# Patient Record
Sex: Female | Born: 1935
Health system: Southern US, Community
[De-identification: ages and names within clinical notes are randomized; demographics above are authoritative.]

## PROBLEM LIST (undated history)

## (undated) DIAGNOSIS — J9611 Chronic respiratory failure with hypoxia: Secondary | ICD-10-CM

## (undated) DIAGNOSIS — K921 Melena: Secondary | ICD-10-CM

## (undated) DIAGNOSIS — Z972 Presence of dental prosthetic device (complete) (partial): Secondary | ICD-10-CM

## (undated) DIAGNOSIS — N3941 Urge incontinence: Secondary | ICD-10-CM

## (undated) DIAGNOSIS — D120 Benign neoplasm of cecum: Secondary | ICD-10-CM

## (undated) DIAGNOSIS — J45909 Unspecified asthma, uncomplicated: Secondary | ICD-10-CM

## (undated) DIAGNOSIS — C3492 Malignant neoplasm of unspecified part of left bronchus or lung: Secondary | ICD-10-CM

## (undated) DIAGNOSIS — I10 Essential (primary) hypertension: Secondary | ICD-10-CM

## (undated) DIAGNOSIS — N813 Complete uterovaginal prolapse: Secondary | ICD-10-CM

## (undated) DIAGNOSIS — J439 Emphysema, unspecified: Secondary | ICD-10-CM

## (undated) DIAGNOSIS — E785 Hyperlipidemia, unspecified: Secondary | ICD-10-CM

## (undated) DIAGNOSIS — K625 Hemorrhage of anus and rectum: Secondary | ICD-10-CM

## (undated) DIAGNOSIS — K529 Noninfective gastroenteritis and colitis, unspecified: Secondary | ICD-10-CM

## (undated) DIAGNOSIS — C3490 Malignant neoplasm of unspecified part of unspecified bronchus or lung: Secondary | ICD-10-CM

## (undated) DIAGNOSIS — N952 Postmenopausal atrophic vaginitis: Secondary | ICD-10-CM

## (undated) DIAGNOSIS — D123 Benign neoplasm of transverse colon: Secondary | ICD-10-CM

## (undated) DIAGNOSIS — IMO0002 Reserved for concepts with insufficient information to code with codable children: Secondary | ICD-10-CM

## (undated) HISTORY — DX: Noninfective gastroenteritis and colitis, unspecified: K52.9

## (undated) HISTORY — DX: Melena: K92.1

## (undated) HISTORY — DX: Reserved for concepts with insufficient information to code with codable children: IMO0002

## (undated) HISTORY — DX: Emphysema, unspecified: J43.9

## (undated) HISTORY — DX: Complete uterovaginal prolapse: N81.3

## (undated) HISTORY — DX: Chronic respiratory failure with hypoxia: J96.11

## (undated) HISTORY — DX: Hemorrhage of anus and rectum: K62.5

## (undated) HISTORY — DX: Benign neoplasm of cecum: D12.0

## (undated) HISTORY — DX: Postmenopausal atrophic vaginitis: N95.2

## (undated) HISTORY — DX: Essential (primary) hypertension: I10

## (undated) HISTORY — DX: Urge incontinence: N39.41

## (undated) HISTORY — DX: Malignant neoplasm of unspecified part of left bronchus or lung: C34.92

## (undated) HISTORY — DX: Benign neoplasm of transverse colon: D12.3

## (undated) HISTORY — DX: Hyperlipidemia, unspecified: E78.5

---

## 1968-07-28 HISTORY — PX: OOPHORECTOMY: SHX86

## 2011-07-13 ENCOUNTER — Ambulatory Visit: Payer: Self-pay

## 2011-07-18 ENCOUNTER — Ambulatory Visit: Payer: Self-pay

## 2011-08-06 DIAGNOSIS — J40 Bronchitis, not specified as acute or chronic: Secondary | ICD-10-CM | POA: Diagnosis not present

## 2011-08-06 DIAGNOSIS — N814 Uterovaginal prolapse, unspecified: Secondary | ICD-10-CM | POA: Diagnosis not present

## 2011-08-28 DIAGNOSIS — H2510 Age-related nuclear cataract, unspecified eye: Secondary | ICD-10-CM | POA: Diagnosis not present

## 2012-01-20 DIAGNOSIS — R030 Elevated blood-pressure reading, without diagnosis of hypertension: Secondary | ICD-10-CM | POA: Diagnosis not present

## 2012-01-20 DIAGNOSIS — R51 Headache: Secondary | ICD-10-CM | POA: Diagnosis not present

## 2012-01-20 DIAGNOSIS — J449 Chronic obstructive pulmonary disease, unspecified: Secondary | ICD-10-CM | POA: Diagnosis not present

## 2012-02-09 DIAGNOSIS — T148 Other injury of unspecified body region: Secondary | ICD-10-CM | POA: Diagnosis not present

## 2012-02-09 DIAGNOSIS — J449 Chronic obstructive pulmonary disease, unspecified: Secondary | ICD-10-CM | POA: Diagnosis not present

## 2012-03-02 DIAGNOSIS — J449 Chronic obstructive pulmonary disease, unspecified: Secondary | ICD-10-CM | POA: Diagnosis not present

## 2012-03-02 DIAGNOSIS — N814 Uterovaginal prolapse, unspecified: Secondary | ICD-10-CM | POA: Diagnosis not present

## 2012-04-06 DIAGNOSIS — N813 Complete uterovaginal prolapse: Secondary | ICD-10-CM | POA: Diagnosis not present

## 2012-04-06 DIAGNOSIS — N952 Postmenopausal atrophic vaginitis: Secondary | ICD-10-CM | POA: Diagnosis not present

## 2012-04-19 DIAGNOSIS — N952 Postmenopausal atrophic vaginitis: Secondary | ICD-10-CM | POA: Diagnosis not present

## 2012-04-19 DIAGNOSIS — N813 Complete uterovaginal prolapse: Secondary | ICD-10-CM | POA: Diagnosis not present

## 2012-04-26 DIAGNOSIS — N952 Postmenopausal atrophic vaginitis: Secondary | ICD-10-CM | POA: Diagnosis not present

## 2012-04-26 DIAGNOSIS — N813 Complete uterovaginal prolapse: Secondary | ICD-10-CM | POA: Diagnosis not present

## 2012-05-11 DIAGNOSIS — R030 Elevated blood-pressure reading, without diagnosis of hypertension: Secondary | ICD-10-CM | POA: Diagnosis not present

## 2012-05-11 DIAGNOSIS — N813 Complete uterovaginal prolapse: Secondary | ICD-10-CM | POA: Diagnosis not present

## 2012-05-11 DIAGNOSIS — N952 Postmenopausal atrophic vaginitis: Secondary | ICD-10-CM | POA: Diagnosis not present

## 2012-05-12 DIAGNOSIS — Z23 Encounter for immunization: Secondary | ICD-10-CM | POA: Diagnosis not present

## 2012-05-12 DIAGNOSIS — I10 Essential (primary) hypertension: Secondary | ICD-10-CM | POA: Diagnosis not present

## 2012-05-12 DIAGNOSIS — J449 Chronic obstructive pulmonary disease, unspecified: Secondary | ICD-10-CM | POA: Diagnosis not present

## 2012-06-08 DIAGNOSIS — J449 Chronic obstructive pulmonary disease, unspecified: Secondary | ICD-10-CM | POA: Diagnosis not present

## 2012-06-08 DIAGNOSIS — I10 Essential (primary) hypertension: Secondary | ICD-10-CM | POA: Diagnosis not present

## 2012-06-09 DIAGNOSIS — N952 Postmenopausal atrophic vaginitis: Secondary | ICD-10-CM | POA: Diagnosis not present

## 2012-06-09 DIAGNOSIS — R030 Elevated blood-pressure reading, without diagnosis of hypertension: Secondary | ICD-10-CM | POA: Diagnosis not present

## 2012-06-09 DIAGNOSIS — N813 Complete uterovaginal prolapse: Secondary | ICD-10-CM | POA: Diagnosis not present

## 2012-07-15 DIAGNOSIS — N814 Uterovaginal prolapse, unspecified: Secondary | ICD-10-CM | POA: Diagnosis not present

## 2012-07-15 DIAGNOSIS — N952 Postmenopausal atrophic vaginitis: Secondary | ICD-10-CM | POA: Diagnosis not present

## 2012-09-08 DIAGNOSIS — I10 Essential (primary) hypertension: Secondary | ICD-10-CM | POA: Diagnosis not present

## 2012-09-08 DIAGNOSIS — E785 Hyperlipidemia, unspecified: Secondary | ICD-10-CM | POA: Diagnosis not present

## 2012-09-08 DIAGNOSIS — Z0000 Encounter for general adult medical examination without abnormal findings: Secondary | ICD-10-CM | POA: Diagnosis not present

## 2012-09-08 DIAGNOSIS — J3489 Other specified disorders of nose and nasal sinuses: Secondary | ICD-10-CM | POA: Diagnosis not present

## 2012-09-13 DIAGNOSIS — Z0000 Encounter for general adult medical examination without abnormal findings: Secondary | ICD-10-CM | POA: Diagnosis not present

## 2012-09-16 DIAGNOSIS — E039 Hypothyroidism, unspecified: Secondary | ICD-10-CM | POA: Diagnosis not present

## 2012-09-16 DIAGNOSIS — R6889 Other general symptoms and signs: Secondary | ICD-10-CM | POA: Diagnosis not present

## 2012-09-20 DIAGNOSIS — N952 Postmenopausal atrophic vaginitis: Secondary | ICD-10-CM | POA: Diagnosis not present

## 2012-09-20 DIAGNOSIS — R030 Elevated blood-pressure reading, without diagnosis of hypertension: Secondary | ICD-10-CM | POA: Diagnosis not present

## 2012-09-20 DIAGNOSIS — N813 Complete uterovaginal prolapse: Secondary | ICD-10-CM | POA: Diagnosis not present

## 2012-12-13 DIAGNOSIS — N813 Complete uterovaginal prolapse: Secondary | ICD-10-CM | POA: Diagnosis not present

## 2012-12-13 DIAGNOSIS — R030 Elevated blood-pressure reading, without diagnosis of hypertension: Secondary | ICD-10-CM | POA: Diagnosis not present

## 2012-12-13 DIAGNOSIS — R6889 Other general symptoms and signs: Secondary | ICD-10-CM | POA: Diagnosis not present

## 2012-12-13 DIAGNOSIS — I10 Essential (primary) hypertension: Secondary | ICD-10-CM | POA: Diagnosis not present

## 2012-12-13 DIAGNOSIS — N8111 Cystocele, midline: Secondary | ICD-10-CM | POA: Diagnosis not present

## 2012-12-13 DIAGNOSIS — J3489 Other specified disorders of nose and nasal sinuses: Secondary | ICD-10-CM | POA: Diagnosis not present

## 2012-12-13 DIAGNOSIS — N952 Postmenopausal atrophic vaginitis: Secondary | ICD-10-CM | POA: Diagnosis not present

## 2013-01-13 DIAGNOSIS — R252 Cramp and spasm: Secondary | ICD-10-CM | POA: Diagnosis not present

## 2013-01-13 DIAGNOSIS — I10 Essential (primary) hypertension: Secondary | ICD-10-CM | POA: Diagnosis not present

## 2013-01-13 DIAGNOSIS — R51 Headache: Secondary | ICD-10-CM | POA: Diagnosis not present

## 2013-01-24 DIAGNOSIS — R946 Abnormal results of thyroid function studies: Secondary | ICD-10-CM | POA: Diagnosis not present

## 2013-01-24 LAB — TSH: TSH: 0 u[IU]/mL — AB (ref <=5.90)

## 2013-02-15 DIAGNOSIS — R51 Headache: Secondary | ICD-10-CM | POA: Diagnosis not present

## 2013-02-15 DIAGNOSIS — J449 Chronic obstructive pulmonary disease, unspecified: Secondary | ICD-10-CM | POA: Diagnosis not present

## 2013-03-21 DIAGNOSIS — N813 Complete uterovaginal prolapse: Secondary | ICD-10-CM | POA: Diagnosis not present

## 2013-03-21 DIAGNOSIS — N3941 Urge incontinence: Secondary | ICD-10-CM | POA: Diagnosis not present

## 2013-03-21 DIAGNOSIS — N952 Postmenopausal atrophic vaginitis: Secondary | ICD-10-CM | POA: Diagnosis not present

## 2013-04-13 DIAGNOSIS — E041 Nontoxic single thyroid nodule: Secondary | ICD-10-CM | POA: Diagnosis not present

## 2013-04-13 DIAGNOSIS — E0590 Thyrotoxicosis, unspecified without thyrotoxic crisis or storm: Secondary | ICD-10-CM | POA: Diagnosis not present

## 2013-04-28 DIAGNOSIS — Z23 Encounter for immunization: Secondary | ICD-10-CM | POA: Diagnosis not present

## 2013-05-16 DIAGNOSIS — E0590 Thyrotoxicosis, unspecified without thyrotoxic crisis or storm: Secondary | ICD-10-CM | POA: Diagnosis not present

## 2013-05-16 DIAGNOSIS — E041 Nontoxic single thyroid nodule: Secondary | ICD-10-CM | POA: Diagnosis not present

## 2013-05-23 DIAGNOSIS — E0520 Thyrotoxicosis with toxic multinodular goiter without thyrotoxic crisis or storm: Secondary | ICD-10-CM | POA: Diagnosis not present

## 2013-06-07 ENCOUNTER — Ambulatory Visit: Payer: Self-pay

## 2013-06-07 DIAGNOSIS — E0590 Thyrotoxicosis, unspecified without thyrotoxic crisis or storm: Secondary | ICD-10-CM | POA: Diagnosis not present

## 2013-06-08 DIAGNOSIS — E0590 Thyrotoxicosis, unspecified without thyrotoxic crisis or storm: Secondary | ICD-10-CM | POA: Diagnosis not present

## 2013-06-17 ENCOUNTER — Ambulatory Visit: Payer: Self-pay

## 2013-06-17 DIAGNOSIS — E0590 Thyrotoxicosis, unspecified without thyrotoxic crisis or storm: Secondary | ICD-10-CM | POA: Diagnosis not present

## 2013-07-25 DIAGNOSIS — E0590 Thyrotoxicosis, unspecified without thyrotoxic crisis or storm: Secondary | ICD-10-CM | POA: Diagnosis not present

## 2013-08-01 DIAGNOSIS — E0510 Thyrotoxicosis with toxic single thyroid nodule without thyrotoxic crisis or storm: Secondary | ICD-10-CM | POA: Diagnosis not present

## 2013-08-23 DIAGNOSIS — E0500 Thyrotoxicosis with diffuse goiter without thyrotoxic crisis or storm: Secondary | ICD-10-CM | POA: Diagnosis not present

## 2013-08-23 DIAGNOSIS — J449 Chronic obstructive pulmonary disease, unspecified: Secondary | ICD-10-CM | POA: Diagnosis not present

## 2013-08-23 DIAGNOSIS — J309 Allergic rhinitis, unspecified: Secondary | ICD-10-CM | POA: Diagnosis not present

## 2013-08-23 DIAGNOSIS — I10 Essential (primary) hypertension: Secondary | ICD-10-CM | POA: Diagnosis not present

## 2013-10-31 DIAGNOSIS — N8111 Cystocele, midline: Secondary | ICD-10-CM | POA: Diagnosis not present

## 2013-10-31 DIAGNOSIS — N952 Postmenopausal atrophic vaginitis: Secondary | ICD-10-CM | POA: Diagnosis not present

## 2013-10-31 DIAGNOSIS — E0590 Thyrotoxicosis, unspecified without thyrotoxic crisis or storm: Secondary | ICD-10-CM | POA: Diagnosis not present

## 2013-10-31 DIAGNOSIS — N3941 Urge incontinence: Secondary | ICD-10-CM | POA: Diagnosis not present

## 2013-10-31 DIAGNOSIS — N813 Complete uterovaginal prolapse: Secondary | ICD-10-CM | POA: Diagnosis not present

## 2013-11-01 DIAGNOSIS — E0590 Thyrotoxicosis, unspecified without thyrotoxic crisis or storm: Secondary | ICD-10-CM | POA: Diagnosis not present

## 2013-11-07 DIAGNOSIS — E0520 Thyrotoxicosis with toxic multinodular goiter without thyrotoxic crisis or storm: Secondary | ICD-10-CM | POA: Diagnosis not present

## 2013-11-16 DIAGNOSIS — Z23 Encounter for immunization: Secondary | ICD-10-CM | POA: Diagnosis not present

## 2013-11-16 DIAGNOSIS — Z01419 Encounter for gynecological examination (general) (routine) without abnormal findings: Secondary | ICD-10-CM | POA: Diagnosis not present

## 2013-11-16 DIAGNOSIS — J449 Chronic obstructive pulmonary disease, unspecified: Secondary | ICD-10-CM | POA: Diagnosis not present

## 2013-11-16 DIAGNOSIS — Z0000 Encounter for general adult medical examination without abnormal findings: Secondary | ICD-10-CM | POA: Diagnosis not present

## 2013-11-16 DIAGNOSIS — I10 Essential (primary) hypertension: Secondary | ICD-10-CM | POA: Diagnosis not present

## 2013-11-16 DIAGNOSIS — E785 Hyperlipidemia, unspecified: Secondary | ICD-10-CM | POA: Diagnosis not present

## 2013-11-16 LAB — LIPID PANEL
Cholesterol: 194 mg/dL (ref 0–200)
HDL: 75 mg/dL — AB (ref 35–70)
LDL Cholesterol: 99 mg/dL
Triglycerides: 101 mg/dL (ref 40–160)

## 2014-03-09 DIAGNOSIS — E0520 Thyrotoxicosis with toxic multinodular goiter without thyrotoxic crisis or storm: Secondary | ICD-10-CM | POA: Diagnosis not present

## 2014-03-14 DIAGNOSIS — N8111 Cystocele, midline: Secondary | ICD-10-CM | POA: Diagnosis not present

## 2014-03-14 DIAGNOSIS — N813 Complete uterovaginal prolapse: Secondary | ICD-10-CM | POA: Diagnosis not present

## 2014-03-14 DIAGNOSIS — N3941 Urge incontinence: Secondary | ICD-10-CM | POA: Diagnosis not present

## 2014-03-14 DIAGNOSIS — N952 Postmenopausal atrophic vaginitis: Secondary | ICD-10-CM | POA: Diagnosis not present

## 2014-03-16 DIAGNOSIS — E0520 Thyrotoxicosis with toxic multinodular goiter without thyrotoxic crisis or storm: Secondary | ICD-10-CM | POA: Diagnosis not present

## 2014-05-15 DIAGNOSIS — Z23 Encounter for immunization: Secondary | ICD-10-CM | POA: Diagnosis not present

## 2014-07-13 DIAGNOSIS — N813 Complete uterovaginal prolapse: Secondary | ICD-10-CM | POA: Diagnosis not present

## 2014-07-13 DIAGNOSIS — Z4689 Encounter for fitting and adjustment of other specified devices: Secondary | ICD-10-CM | POA: Diagnosis not present

## 2014-07-13 DIAGNOSIS — N952 Postmenopausal atrophic vaginitis: Secondary | ICD-10-CM | POA: Diagnosis not present

## 2014-07-13 DIAGNOSIS — N8110 Cystocele, unspecified: Secondary | ICD-10-CM | POA: Diagnosis not present

## 2014-07-13 DIAGNOSIS — N3941 Urge incontinence: Secondary | ICD-10-CM | POA: Diagnosis not present

## 2014-09-12 DIAGNOSIS — E0520 Thyrotoxicosis with toxic multinodular goiter without thyrotoxic crisis or storm: Secondary | ICD-10-CM | POA: Diagnosis not present

## 2014-09-19 DIAGNOSIS — E0520 Thyrotoxicosis with toxic multinodular goiter without thyrotoxic crisis or storm: Secondary | ICD-10-CM | POA: Diagnosis not present

## 2014-11-06 DIAGNOSIS — H1013 Acute atopic conjunctivitis, bilateral: Secondary | ICD-10-CM | POA: Diagnosis not present

## 2014-11-06 DIAGNOSIS — J309 Allergic rhinitis, unspecified: Secondary | ICD-10-CM | POA: Diagnosis not present

## 2014-11-06 DIAGNOSIS — I10 Essential (primary) hypertension: Secondary | ICD-10-CM | POA: Diagnosis not present

## 2014-11-14 DIAGNOSIS — Z4689 Encounter for fitting and adjustment of other specified devices: Secondary | ICD-10-CM | POA: Diagnosis not present

## 2014-11-14 DIAGNOSIS — N3941 Urge incontinence: Secondary | ICD-10-CM | POA: Diagnosis not present

## 2014-11-17 ENCOUNTER — Encounter: Payer: Self-pay | Admitting: Internal Medicine

## 2014-11-17 DIAGNOSIS — M7070 Other bursitis of hip, unspecified hip: Secondary | ICD-10-CM | POA: Insufficient documentation

## 2014-11-17 DIAGNOSIS — J309 Allergic rhinitis, unspecified: Secondary | ICD-10-CM | POA: Insufficient documentation

## 2014-11-17 DIAGNOSIS — N814 Uterovaginal prolapse, unspecified: Secondary | ICD-10-CM | POA: Insufficient documentation

## 2014-11-17 DIAGNOSIS — H1010 Acute atopic conjunctivitis, unspecified eye: Secondary | ICD-10-CM | POA: Insufficient documentation

## 2014-11-17 DIAGNOSIS — J439 Emphysema, unspecified: Secondary | ICD-10-CM | POA: Insufficient documentation

## 2014-11-17 DIAGNOSIS — I10 Essential (primary) hypertension: Secondary | ICD-10-CM | POA: Insufficient documentation

## 2014-11-17 DIAGNOSIS — E782 Mixed hyperlipidemia: Secondary | ICD-10-CM | POA: Insufficient documentation

## 2014-11-17 DIAGNOSIS — E0500 Thyrotoxicosis with diffuse goiter without thyrotoxic crisis or storm: Secondary | ICD-10-CM | POA: Insufficient documentation

## 2015-01-02 ENCOUNTER — Other Ambulatory Visit: Payer: Self-pay | Admitting: Internal Medicine

## 2015-01-18 ENCOUNTER — Ambulatory Visit (INDEPENDENT_AMBULATORY_CARE_PROVIDER_SITE_OTHER): Payer: Medicare Other | Admitting: Internal Medicine

## 2015-01-18 ENCOUNTER — Encounter: Payer: Self-pay | Admitting: Internal Medicine

## 2015-01-18 VITALS — BP 136/86 | HR 64 | Ht 64.75 in | Wt 148.0 lb

## 2015-01-18 DIAGNOSIS — J438 Other emphysema: Secondary | ICD-10-CM | POA: Diagnosis not present

## 2015-01-18 DIAGNOSIS — Z23 Encounter for immunization: Secondary | ICD-10-CM

## 2015-01-18 DIAGNOSIS — I10 Essential (primary) hypertension: Secondary | ICD-10-CM | POA: Diagnosis not present

## 2015-01-18 DIAGNOSIS — E782 Mixed hyperlipidemia: Secondary | ICD-10-CM

## 2015-01-18 LAB — POCT URINALYSIS DIPSTICK
BILIRUBIN UA: NEGATIVE
Blood, UA: NEGATIVE
Glucose, UA: NEGATIVE
KETONES UA: NEGATIVE
Leukocytes, UA: NEGATIVE
Nitrite, UA: NEGATIVE
Protein, UA: NEGATIVE
Spec Grav, UA: 1.01
Urobilinogen, UA: 0.2
pH, UA: 5

## 2015-01-18 NOTE — Progress Notes (Signed)
Patient: Paige Hart, Female    DOB: [birth year], 80 y.o.   MRN: [id number] Visit Date: 01/18/2015  Today's Provider: Halina Maidens, MD   Chief Complaint  Patient presents with  . Medicare Wellness   Subjective:    Annual wellness visit Paige Hart is a 80 y.o. female who presents today for her Subsequent Annual Wellness Visit. She feels well. She reports exercising - home exercises and walking daily. She reports she is sleeping well.   ----------------------------------------------------------- Hypertension This is a chronic problem. The current episode started more than 1 year ago. The problem is unchanged. The problem is controlled. Associated symptoms include shortness of breath. Pertinent negatives include no blurred vision, chest pain, headaches, orthopnea, palpitations, PND or sweats. There are no associated agents to hypertension. Risk factors for coronary artery disease include dyslipidemia. Past treatments include angiotensin blockers. The current treatment provides significant improvement. There are no compliance problems.  There is no history of kidney disease, CAD/MI or CVA.  Breathing Problem She complains of cough, difficulty breathing and shortness of breath. This is a chronic problem. The current episode started more than 1 year ago. The problem occurs intermittently. The problem has been waxing and waning. Pertinent negatives include no appetite change, chest pain, fever, headaches, myalgias, PND, sweats or trouble swallowing. Her symptoms are aggravated by any activity and change in weather. Her symptoms are alleviated by steroid inhaler (takes symbicort every AM and in the evening as needed). She reports significant improvement on treatment.  Hyperlipidemia This is a chronic problem. The current episode started more than 1 year ago. Recent lipid tests were reviewed and are high. Exacerbating diseases include hypothyroidism. There are no known factors aggravating her  hyperlipidemia. Associated symptoms include shortness of breath. Pertinent negatives include no chest pain or myalgias. Current antihyperlipidemic treatment includes diet change and exercise. The current treatment provides mild improvement of lipids. There are no compliance problems.     Review of Systems  Constitutional: Negative for fever, activity change, appetite change and unexpected weight change.  HENT: Negative for hearing loss, trouble swallowing and voice change.   Eyes: Negative for blurred vision and visual disturbance.  Respiratory: Positive for cough and shortness of breath. Negative for choking and chest tightness.   Cardiovascular: Negative for chest pain, palpitations, orthopnea, leg swelling and PND.  Gastrointestinal: Negative for abdominal pain, diarrhea and constipation.  Genitourinary: Positive for urgency (uses a pessary). Negative for dysuria and hematuria.  Musculoskeletal: Negative for myalgias and back pain.  Skin: Negative for color change and rash.  Neurological: Negative for tremors, numbness and headaches.  Psychiatric/Behavioral: Negative for sleep disturbance and dysphoric mood.    History   Social History  . Marital Status: Widowed    Spouse Name: N/A  . Number of Children: N/A  . Years of Education: N/A   Occupational History  . Not on file.   Social History Main Topics  . Smoking status: Former Research scientist (life sciences)  . Smokeless tobacco: Not on file  . Alcohol Use: No  . Drug Use: No  . Sexual Activity: Not on file   Other Topics Concern  . Not on file   Social History Narrative    Patient Active Problem List   Diagnosis Date Noted  . Allergic conjunctivitis 11/17/2014  . Allergic rhinitis 11/17/2014  . Essential (primary) hypertension 11/17/2014  . Graves disease 11/17/2014  . Bursitis of hip 11/17/2014  . Combined fat and carbohydrate induced hyperlipemia 11/17/2014  . Chronic obstructive pulmonary emphysema 11/17/2014  . Pelvic  relaxation due  to uterovaginal prolapse - pessary use 11/17/2014    Past Surgical History  Procedure Laterality Date  . Oophorectomy  1970    Her family history is not on file.    Previous Medications   BUDESONIDE-FORMOTEROL (SYMBICORT) 160-4.5 MCG/ACT INHALER    Inhale 1 puff into the lungs 2 (two) times daily.   CETIRIZINE HCL 10 MG CAPS    Take 1 tablet by mouth daily.   ESTRADIOL (ESTRACE) 0.1 MG/GM VAGINAL CREAM    Place vaginally.   LOSARTAN (COZAAR) 100 MG TABLET    TAKE ONE TABLET BY MOUTH ONCE DAILY   METHIMAZOLE (TAPAZOLE) 5 MG TABLET    Take 1 tablet by mouth daily.   MULTIPLE VITAMINS-MINERALS PO    Take 1 tablet by mouth daily.   OXYQUINOLONE SULFATE VAGINAL 0.025 % GEL    Place 1 application vaginally once a week.    Patient Care Team: Glean Hess, MD as PCP - General (Family Medicine) Judi Cong, MD as Physician Assistant (Endocrinology) Brayton Mars, MD as Consulting Physician (Obstetrics and Gynecology)     Objective:   Vitals: BP 136/86 mmHg  Pulse 64  Ht 5' 4.75" (1.645 m)  Wt 148 lb (67.132 kg)  BMI 24.81 kg/m2  Physical Exam  Constitutional: She is oriented to person, place, and time. She appears well-developed and well-nourished. No distress.  HENT:  Head: Normocephalic and atraumatic.  Mouth/Throat: No oropharyngeal exudate.  Eyes: Conjunctivae are normal. Pupils are equal, round, and reactive to light. Left eye exhibits no discharge.  Neck: Normal range of motion. Neck supple. No thyromegaly present.  Cardiovascular: Normal rate, regular rhythm and normal heart sounds.   Pulmonary/Chest: Effort normal. She has decreased breath sounds. She has no wheezes.  Abdominal: Soft. Normal appearance. There is no hepatosplenomegaly. There is no tenderness. There is no CVA tenderness.  Genitourinary: No breast swelling, tenderness, discharge or bleeding.  Lymphadenopathy:    She has no cervical adenopathy.    She has no axillary adenopathy.  Neurological:  She is alert and oriented to person, place, and time. She has normal reflexes.  Skin: Skin is warm, dry and intact.  Psychiatric: She has a normal mood and affect. Her speech is normal and behavior is normal. Cognition and memory are normal.    Activities of Daily Living In your present state of health, do you have any difficulty performing the following activities: 01/18/2015  Hearing? N  Vision? N  Difficulty concentrating or making decisions? N  Walking or climbing stairs? N  Dressing or bathing? N  Doing errands, shopping? N    Fall Risk Assessment Fall Risk  01/18/2015  Falls in the past year? No     Patient reports there are safety devices in place in shower at home.  Depression Screen PHQ 2/9 Scores 01/18/2015  PHQ - 2 Score 0    Cognitive Testing - 6-CIT   Correct? Score   What year is it? yes 0 Yes = 0    No = 4  What month is it? yes 0 Yes = 0    No = 3  Remember:     Pia [address]au, [place], [place]     What time is it? yes 0 Yes = 0    No = 3  Count backwards from 20 to 1 yes 0 Correct = 0    1 error = 2   More than 1 error = 4  Say the months  of the year in reverse. yes 0 Correct = 0    1 error = 2   More than 1 error = 4  What address did I ask you to remember? yes 2 Correct = 0  1 error = 2    2 error = 4    3 error = 6    4 error = 8    All wrong = 10       TOTAL SCORE  2/28   Interpretation:  Normal  Normal (0-7) Abnormal (8-28)        Assessment & Plan:     Annual Wellness Visit  Reviewed patient's Family Medical History Reviewed and updated list of patient's medical providers Assessment of cognitive impairment was done Assessed patient's functional ability Established a written schedule for health screening [place] Completed and Reviewed  Exercise Activities and Dietary recommendations Goals    . Reduce calorie intake to 2000 calories per day     She is starting a new diet/cleanse - she only consumes 500 cal two  days per week and eats normally on the other days.       Immunization History  Administered Date(s) Administered  . Pneumococcal Polysaccharide-23 11/16/2013    Health Maintenance  Topic Date Due  . TETANUS/TDAP  08/30/1955  . COLONOSCOPY  08/29/1986  . ZOSTAVAX  08/29/1996  . DEXA SCAN  08/29/2001  . PNA vac Low Risk Adult (2 of 2 - PCV13) 11/17/2014  . INFLUENZA VACCINE  02/26/2015     Discussed health benefits of physical activity, and encouraged her to engage in regular exercise appropriate for her age and condition.    ------------------------------------------------------------------------------------------------------------  1. Essential (primary) hypertension The current medical regimen is effective;  continue present plan and medications. - CBC with Differential/Platelet - Comprehensive metabolic panel - POCT urinalysis dipstick  2. Other emphysema Stable on Symbicort MDI  3. Combined fat and carbohydrate induced hyperlipemia Continue diet and exercise  - Lipid panel  4. Need for pneumococcal vaccination - Pneumococcal conjugate vaccine 13-valent IM    Halina Maidens, MD [facility]  01/18/2015

## 2015-01-18 NOTE — Patient Instructions (Addendum)
Pneumococcal Conjugate Vaccine: What You Need to Know Your doctor recommends that you, or your child, get a dose of PCV13 today. 1. Why get vaccinated? Pneumococcal conjugate vaccine (called PCV13 or Prevnar 13) is recommended to protect infants and toddlers, and some older children and adults with certain health conditions, from pneumococcal disease. Pneumococcal disease is caused by infection with Streptococcus pneumoniae bacteria. These bacteria can spread from person to person through close contact. Pneumococcal disease can lead to severe health problems, including pneumonia, blood infections, and meningitis. Meningitis is an infection of the covering of the brain. Pneumococcal meningitis is fairly rare (less than 1 case per 100,000 people each year), but it leads to other health problems, including deafness and brain damage. In children, it is fatal in about 1 case out of 10. Children younger than two are at higher risk for serious disease than older children. People with certain medical conditions, people over age 24, and cigarette smokers are also at higher risk. Before vaccine, pneumococcal infections caused many problems each year in the [place] in children younger than 5, including: 1. more than 700 cases of meningitis, 2. 13,000 blood infections, 3. about 5 million ear infections, and 4. about 200 deaths. About 4,000 adults still die each year because of pneumococcal infections. Pneumococcal infections can be hard to treat because some strains are resistant to antibiotics. This makes prevention through vaccination even more important. 2. PCV13 vaccine There are more than 90 types of pneumococcal bacteria. PCV13 protects against 13 of them. These 13 strains cause most severe infections in children and about half of infections in adults.  PCV13 is routinely given to children at 2, 4, 6, and 50-10 months of age. Children in this age range are at greatest risk for serious diseases  caused by pneumococcal infection. PCV13 vaccine may also be recommended for some older children or adults. Your doctor can give you details. A second type of pneumococcal vaccine, called PPSV23, may also be given to some children and adults, including anyone over age 12. There is a separate Vaccine Information Statement for this vaccine. 3. Precautions  Anyone who has ever had a life-threatening allergic reaction to a dose of this vaccine, to an earlier pneumococcal vaccine called PCV7 (or Prevnar), or to any vaccine containing diphtheria toxoid (for example, DTaP), should not get PCV13. Anyone with a severe allergy to any component of PCV13 should not get the vaccine. Tell your doctor if the person being vaccinated has any severe allergies. If the person scheduled for vaccination is sick, your doctor might decide to reschedule the shot on another day. Your doctor can give you more information about any of these precautions. 4. What are the risks of PCV13 vaccine?  With any medicine, including vaccines, there is a chance of side effects. These are usually mild and go away on their own, but serious reactions are also possible. Reported problems associated with PCV13 vary by dose and age, but generally:  About half of children became drowsy after the shot, had a temporary loss of appetite, or had redness or tenderness where the shot was given.  About 1 out of 3 had swelling where the shot was given.  About 1 out of 3 had a mild fever, and about 1 in 20 had a higher fever (over 102.34F).  Up to about 8 out of 10 became fussy or irritable. Adults receiving the vaccine have reported redness, pain, and swelling where the shot was given. Mild fever, fatigue, headache, chills, or  muscle pain have also been reported. Life-threatening allergic reactions from any vaccine are very rare. 5. What if there is a serious reaction? What should I look for?  Look for anything that concerns you, such as signs of  a severe allergic reaction, very high fever, or behavior changes. Signs of a severe allergic reaction can include hives, swelling of the face and throat, difficulty breathing, a fast heartbeat, dizziness, and weakness. These would start a few minutes to a few hours after the vaccination. What should I do?  If you think it is a severe allergic reaction or other emergency that can't wait, call 9-1-1 or get the person to the nearest hospital. Otherwise, call your doctor.  Afterward, the reaction should be reported to the Vaccine Adverse Event Reporting System (VAERS). Your doctor might file this report, or you can do it yourself through the VAERS web site at www.vaers.SamedayNews.es, or by calling 928-603-9961. VAERS is only for reporting reactions. They do not give medical advice. 6. The National Vaccine Injury Compensation Program The [employer] Vaccine Injury Compensation Program (VICP) is a federal program that was created to compensate people who may have been injured by certain vaccines. Persons who believe they may have been injured by a vaccine can learn about the program and about filing a claim by calling 250-153-7787 or visiting the [place] website at GoldCloset.com.ee. 7. How can I learn more?  Ask your doctor.  Call your local or state health department.  Contact the Centers for Disease Control and Prevention (CDC):  Call 603-862-9731 (1-800-CDC-INFO) or  Visit CDC's website at http://hunter.com/ CDC PCV13 Vaccine VIS (Interim) (09/24/11) Document Released: 05/11/2006 Document Revised: 11/28/2013 Document Reviewed: 09/02/2013 [facility] Patient Information 2015 [place], [place]. This information is not intended to replace advice given to you by your health care provider. Make sure you discuss any questions you have with your health care provider.   Health Maintenance  Topic Date Due  . TETANUS/TDAP  08/30/1955  . COLONOSCOPY  08/29/1986  . ZOSTAVAX  Shingles vaccine -  any time after age 81  . DEXA SCAN  08/29/2001  . PNA vac Low Risk Adult (2 of 2 - PCV13) Given today  . INFLUENZA VACCINE  02/26/2015    \ Breast Self-Awareness Practicing breast self-awareness may pick up problems early, prevent significant medical complications, and possibly save your life. By practicing breast self-awareness, you can become familiar with how your breasts look and feel and if your breasts are changing. This allows you to notice changes early. It can also offer you some reassurance that your breast health is good. One way to learn what is normal for your breasts and whether your breasts are changing is to do a breast self-exam. If you find a lump or something that was not present in the past, it is best to contact your caregiver right away. Other findings that should be evaluated by your caregiver include nipple discharge, especially if it is bloody; skin changes or reddening; areas where the skin seems to be pulled in (retracted); or new lumps and bumps. Breast pain is seldom associated with cancer (malignancy), but should also be evaluated by a caregiver. HOW TO PERFORM A BREAST SELF-EXAM The best time to examine your breasts is 5-7 days after your menstrual period is over. During menstruation, the breasts are lumpier, and it may be more difficult to pick up changes. If you do not menstruate, have reached menopause, or had your uterus removed (hysterectomy), you should examine your breasts at regular intervals,  such as monthly. If you are breastfeeding, examine your breasts after a feeding or after using a breast pump. Breast implants do not decrease the risk for lumps or tumors, so continue to perform breast self-exams as recommended. Talk to your caregiver about how to determine the difference between the implant and breast tissue. Also, talk about the amount of pressure you should use during the exam. Over time, you will become more familiar with the variations of your breasts and  more comfortable with the exam. A breast self-exam requires you to remove all your clothes above the waist. 5. Look at your breasts and nipples. Stand in front of a mirror in a room with good lighting. With your hands on your hips, push your hands firmly downward. Look for a difference in shape, contour, and size from one breast to the other (asymmetry). Asymmetry includes puckers, dips, or bumps. Also, look for skin changes, such as reddened or scaly areas on the breasts. Look for nipple changes, such as discharge, dimpling, repositioning, or redness. 6. Carefully feel your breasts. This is best done either in the shower or tub while using soapy water or when flat on your back. Place the arm (on the side of the breast you are examining) above your head. Use the pads (not the fingertips) of your three middle fingers on your opposite hand to feel your breasts. Start in the underarm area and use  inch (2 cm) overlapping circles to feel your breast. Use 3 different levels of pressure (light, medium, and firm pressure) at each circle before moving to the next circle. The light pressure is needed to feel the tissue closest to the skin. The medium pressure will help to feel breast tissue a little deeper, while the firm pressure is needed to feel the tissue close to the ribs. Continue the overlapping circles, moving downward over the breast until you feel your ribs below your breast. Then, move one finger-width towards the center of the body. Continue to use the  inch (2 cm) overlapping circles to feel your breast as you move slowly up toward the collar bone (clavicle) near the base of the neck. Continue the up and down exam using all 3 pressures until you reach the middle of the chest. Do this with each breast, carefully feeling for lumps or changes. 7.  Keep a written record with breast changes or normal findings for each breast. By writing this information down, you do not need to depend only on memory for size,  tenderness, or location. Write down where you are in your menstrual cycle, if you are still menstruating. Breast tissue can have some lumps or thick tissue. However, see your caregiver if you find anything that concerns you.  SEEK MEDICAL CARE IF:  You see a change in shape, contour, or size of your breasts or nipples.   You see skin changes, such as reddened or scaly areas on the breasts or nipples.   You have an unusual discharge from your nipples.   You feel a new lump or unusually thick areas.  Document Released: 07/14/2005 Document Revised: 06/30/2012 Document Reviewed: 10/29/2011 [facility] Patient Information 2015 [place], [place]. This information is not intended to replace advice given to you by your health care provider. Make sure you discuss any questions you have with your health care provider.

## 2015-01-19 LAB — CBC WITH DIFFERENTIAL/PLATELET
Basophils Absolute: 0.1 10*3/uL (ref 0.0–0.2)
Basos: 1 %
EOS (ABSOLUTE): 0.2 10*3/uL (ref 0.0–0.4)
EOS: 2 %
Hematocrit: 41.7 % (ref 34.0–46.6)
Hemoglobin: 13.5 g/dL (ref 11.1–15.9)
IMMATURE GRANULOCYTES: 0 %
Immature Grans (Abs): 0 10*3/uL (ref 0.0–0.1)
LYMPHS ABS: 1.6 10*3/uL (ref 0.7–3.1)
Lymphs: 22 %
MCH: 28.5 pg (ref 26.6–33.0)
MCHC: 32.4 g/dL (ref 31.5–35.7)
MCV: 88 fL (ref 79–97)
Monocytes Absolute: 0.6 10*3/uL (ref 0.1–0.9)
Monocytes: 8 %
NEUTROS ABS: 5.1 10*3/uL (ref 1.4–7.0)
Neutrophils: 67 %
Platelets: 235 10*3/uL (ref 150–379)
RBC: 4.74 x10E6/uL (ref 3.77–5.28)
RDW: 14.4 % (ref 12.3–15.4)
WBC: 7.6 10*3/uL (ref 3.4–10.8)

## 2015-01-19 LAB — COMPREHENSIVE METABOLIC PANEL
A/G RATIO: 1.9 (ref 1.1–2.5)
ALT: 14 IU/L (ref 0–32)
AST: 15 IU/L (ref 0–40)
Albumin: 4.3 g/dL (ref 3.5–4.8)
Alkaline Phosphatase: 66 IU/L (ref 39–117)
BUN/Creatinine Ratio: 18 (ref 11–26)
BUN: 15 mg/dL (ref 8–27)
Bilirubin Total: 0.3 mg/dL (ref 0.0–1.2)
CO2: 26 mmol/L (ref 18–29)
Calcium: 8.9 mg/dL (ref 8.7–10.3)
Chloride: 102 mmol/L (ref 97–108)
Creatinine, Ser: 0.85 mg/dL (ref 0.57–1.00)
GFR calc Af Amer: 76 mL/min/{1.73_m2} (ref >59)
GFR, EST NON AFRICAN AMERICAN: 66 mL/min/{1.73_m2} (ref >59)
GLOBULIN, TOTAL: 2.3 g/dL (ref 1.5–4.5)
GLUCOSE: 82 mg/dL (ref 65–99)
Potassium: 4.8 mmol/L (ref 3.5–5.2)
SODIUM: 143 mmol/L (ref 134–144)
Total Protein: 6.6 g/dL (ref 6.0–8.5)

## 2015-01-19 LAB — LIPID PANEL
CHOL/HDL RATIO: 2.5 ratio (ref 0.0–4.4)
Cholesterol, Total: 188 mg/dL (ref 100–199)
HDL: 74 mg/dL (ref >39)
LDL CALC: 92 mg/dL (ref 0–99)
TRIGLYCERIDES: 110 mg/dL (ref 0–149)
VLDL Cholesterol Cal: 22 mg/dL (ref 5–40)

## 2015-02-23 DIAGNOSIS — E0520 Thyrotoxicosis with toxic multinodular goiter without thyrotoxic crisis or storm: Secondary | ICD-10-CM | POA: Diagnosis not present

## 2015-03-02 DIAGNOSIS — E0520 Thyrotoxicosis with toxic multinodular goiter without thyrotoxic crisis or storm: Secondary | ICD-10-CM | POA: Diagnosis not present

## 2015-03-06 ENCOUNTER — Ambulatory Visit: Payer: Self-pay | Admitting: Obstetrics and Gynecology

## 2015-03-14 ENCOUNTER — Ambulatory Visit: Payer: Self-pay | Admitting: Obstetrics and Gynecology

## 2015-03-21 ENCOUNTER — Ambulatory Visit: Payer: Self-pay | Admitting: Obstetrics and Gynecology

## 2015-03-22 ENCOUNTER — Ambulatory Visit (INDEPENDENT_AMBULATORY_CARE_PROVIDER_SITE_OTHER): Payer: Medicare Other | Admitting: Obstetrics and Gynecology

## 2015-03-22 ENCOUNTER — Encounter: Payer: Self-pay | Admitting: Obstetrics and Gynecology

## 2015-03-22 VITALS — BP 178/79 | HR 66 | Ht 64.0 in | Wt 151.7 lb

## 2015-03-22 DIAGNOSIS — Z4689 Encounter for fitting and adjustment of other specified devices: Secondary | ICD-10-CM | POA: Insufficient documentation

## 2015-03-22 DIAGNOSIS — N814 Uterovaginal prolapse, unspecified: Secondary | ICD-10-CM | POA: Diagnosis not present

## 2015-03-22 NOTE — Progress Notes (Signed)
Patient ID: Paige Hart, female   DOB: July 10, 1935, 80 y.o.   MRN: [id number]  Chief complaint: 1.  Pessary Maintenance 2.  Cystocele. 3.  Uterine prolapse   54monthf/.u Pessary check gellhorn 2 1/2 Estrace cream - weekly trimosan- weekly Vb,vd, vo- neg  Past medical history, past surgical history, prolapse, medications, allergies reviewed and updated.  OBJECTIVE: BP 178/79 mmHg  Pulse 66  Ht 5' 4"  (1.626 m)  Wt 151 lb 11.2 oz (68.811 kg)  BMI 26.03 kg/m2 Well-appearing white female in no acute distress. Alert and oriented. Abdomen: Soft, nontender, without organomegaly. Pelvic exam: External genitalia normal BUS-normal Vagina-mild atrophic changes with a few areas of hyperemia without ulceration. Cervix-no lesions. Uterus-nontender Adnexa-nontender. Rectovaginal-external exam normal.  Procedure: Gellhorn pessary is removed, cleaned, and replaced.  IMPRESSION: 1.  Normal pessary check. 2.  Uterovaginal prolapse, stable. 3.  Mild vaginal hyperemia without ulceration.  PLAN: 1.  Continue with Estrace cream intravaginally weekly. 2.  Continue with TRIMO San gel intravaginal weekly. 3.  Return in 4 months for pessary check  MBrayton Mars MD

## 2015-03-22 NOTE — Patient Instructions (Signed)
1. Continue with Estrace cream intravaginally weekly.  2. Continue with TRIMO San gel intravaginal weekly.  3. Return in 4 months for pessary check

## 2015-04-23 ENCOUNTER — Encounter: Payer: Self-pay | Admitting: Internal Medicine

## 2015-04-23 ENCOUNTER — Ambulatory Visit (INDEPENDENT_AMBULATORY_CARE_PROVIDER_SITE_OTHER): Payer: Medicare Other | Admitting: Internal Medicine

## 2015-04-23 VITALS — BP 138/84 | HR 80 | Ht 64.75 in | Wt 151.8 lb

## 2015-04-23 DIAGNOSIS — T63441A Toxic effect of venom of bees, accidental (unintentional), initial encounter: Secondary | ICD-10-CM | POA: Diagnosis not present

## 2015-04-23 MED ORDER — PREDNISONE 10 MG (21) PO TBPK: 10.0000 mg | ORAL_TABLET | Freq: Every day | ORAL | Status: DC

## 2015-04-23 NOTE — Progress Notes (Signed)
Date:  04/23/2015   Name:  Paige Hart   DOB:  07-23-1935   MRN:  [id number]   Chief Complaint: Insect Bite  patient presents after a wasp sting above her left eye yesterday. She didn't take any medication initially used ice pack. This morning when she woke up her eye was more swollen and itching. Her vision is intact and she denies any pain to the eye and no tearing.  Review of Systems:  Review of Systems  Constitutional: Negative for fever and fatigue.  HENT: Positive for facial swelling.   Eyes: Negative for pain, redness and visual disturbance.  Respiratory: Negative for wheezing.   Cardiovascular: Negative for chest pain and palpitations.    Patient Active Problem List   Diagnosis Date Noted  . Pessary maintenance 03/22/2015  . Allergic conjunctivitis 11/17/2014  . Allergic rhinitis 11/17/2014  . Essential (primary) hypertension 11/17/2014  . Graves disease 11/17/2014  . Bursitis of hip 11/17/2014  . Combined fat and carbohydrate induced hyperlipemia 11/17/2014  . Chronic obstructive pulmonary emphysema 11/17/2014  . Pelvic relaxation due to uterovaginal prolapse - pessary use 11/17/2014    Prior to Admission medications   Medication Sig Start Date End Date Taking? Authorizing Provider  budesonide-formoterol (SYMBICORT) 160-4.5 MCG/ACT inhaler Inhale 1 puff into the lungs 2 (two) times daily. 09/28/14  Yes Historical Provider, MD  Cetirizine HCl 10 MG CAPS Take 1 tablet by mouth daily.   Yes Historical Provider, MD  estradiol (ESTRACE) 0.1 MG/GM vaginal cream Place vaginally.   Yes Historical Provider, MD  losartan (COZAAR) 100 MG tablet TAKE ONE TABLET BY MOUTH ONCE DAILY 01/02/15  Yes Glean Hess, MD  methimazole (TAPAZOLE) 5 MG tablet Take 1 tablet by mouth daily.   Yes Historical Provider, MD  MULTIPLE VITAMINS-MINERALS PO Take 1 tablet by mouth daily.   Yes Historical Provider, MD  OXYQUINOLONE SULFATE VAGINAL 0.025 % GEL Place 1 application vaginally once a week.    Yes Historical Provider, MD    No Known Allergies  Past Surgical History  Procedure Laterality Date  . Oophorectomy Left 1970    Social History  Substance Use Topics  . Smoking status: Former Research scientist (life sciences)  . Smokeless tobacco: None  . Alcohol Use: No     Medication list has been reviewed and updated.  Physical Examination:  Physical Exam  Constitutional: She appears well-developed and well-nourished.  Eyes: Conjunctivae and EOM are normal. Pupils are equal, round, and reactive to light.  Soft tissue swelling and edema of the upper and lower lid on the left outer portions.  Neck: Normal range of motion. Neck supple.  Lymphadenopathy:    She has no cervical adenopathy.    BP 138/84 mmHg  Pulse 80  Ht 5' 4.75" (1.645 m)  Wt 151 lb 12.8 oz (68.856 kg)  BMI 25.45 kg/m2  Assessment and Plan: 1. Bee sting reaction, accidental or unintentional, initial encounter Continue Benadryl as needed May use cool compresses if helpful Sleep with the head of the bed slightly elevated to reduce swelling - predniSONE (STERAPRED UNI-PAK 21 TAB) 10 MG (21) TBPK tablet; Take 1 tablet (10 mg total) by mouth daily.  Dispense: 21 tablet; Refill: 0   Halina Maidens, MD [facility]  04/23/2015

## 2015-05-08 ENCOUNTER — Ambulatory Visit (INDEPENDENT_AMBULATORY_CARE_PROVIDER_SITE_OTHER): Payer: Medicare Other

## 2015-05-08 DIAGNOSIS — Z23 Encounter for immunization: Secondary | ICD-10-CM | POA: Diagnosis not present

## 2015-05-22 ENCOUNTER — Ambulatory Visit (INDEPENDENT_AMBULATORY_CARE_PROVIDER_SITE_OTHER): Payer: Medicare Other | Admitting: Internal Medicine

## 2015-05-22 ENCOUNTER — Encounter: Payer: Self-pay | Admitting: Internal Medicine

## 2015-05-22 VITALS — BP 134/72 | HR 76 | Ht 64.75 in | Wt 152.2 lb

## 2015-05-22 DIAGNOSIS — H1013 Acute atopic conjunctivitis, bilateral: Secondary | ICD-10-CM | POA: Diagnosis not present

## 2015-05-22 DIAGNOSIS — H25013 Cortical age-related cataract, bilateral: Secondary | ICD-10-CM | POA: Diagnosis not present

## 2015-05-22 DIAGNOSIS — J438 Other emphysema: Secondary | ICD-10-CM

## 2015-05-22 DIAGNOSIS — I10 Essential (primary) hypertension: Secondary | ICD-10-CM | POA: Diagnosis not present

## 2015-05-22 NOTE — Progress Notes (Signed)
Date:  05/22/2015   Name:  Paige Hart   DOB:  1935-01-13   MRN:  [id number]   Chief Complaint: Asthma Asthma She complains of chest tightness. There is no cough, difficulty breathing, sputum production or wheezing. The problem has been gradually improving. Associated symptoms include postnasal drip, rhinorrhea and sneezing. Pertinent negatives include no chest pain or trouble swallowing. Her symptoms are aggravated by exercise and emotional stress. Her symptoms are alleviated by beta-agonist and steroid inhaler. Her past medical history is significant for asthma.   Allergies - Patient has known environmental allergies for which she takes Zyrtec intermittently. Is complaining of watery eyes and runny nose on a regular basis. Is concerned that this may be a side effect to Symbicort. His been using Symbicort daily for her asthma/COPD and is much improved. Her exercise tolerance is up and she is able to walk almost a mile per day. She has very little wheezing or shortness of breath just occasional chest tightness if she overexerts.   Review of Systems  Constitutional: Negative for chills, diaphoresis and fatigue.  HENT: Positive for postnasal drip, rhinorrhea and sneezing. Negative for congestion, hearing loss, mouth sores, sinus pressure and trouble swallowing.   Eyes: Positive for discharge (clear tears).  Respiratory: Negative for cough, sputum production, choking, wheezing and stridor.   Cardiovascular: Negative for chest pain, palpitations and leg swelling.  Gastrointestinal: Negative for abdominal pain.  Musculoskeletal: Negative for neck pain and neck stiffness.  Skin: Negative for rash.  Allergic/Immunologic: Positive for environmental allergies.  Hematological: Negative for adenopathy.    Patient Active Problem List   Diagnosis Date Noted  . Pessary maintenance 03/22/2015  . Allergic conjunctivitis 11/17/2014  . Allergic rhinitis 11/17/2014  . Essential (primary) hypertension  11/17/2014  . Graves disease 11/17/2014  . Bursitis of hip 11/17/2014  . Combined fat and carbohydrate induced hyperlipemia 11/17/2014  . Chronic obstructive pulmonary emphysema ([place]) 11/17/2014  . Pelvic relaxation due to uterovaginal prolapse - pessary use 11/17/2014    Prior to Admission medications   Medication Sig Start Date End Date Taking? Authorizing Provider  budesonide-formoterol (SYMBICORT) 160-4.5 MCG/ACT inhaler Inhale 1 puff into the lungs 2 (two) times daily. 09/28/14  Yes Historical Provider, MD  Cetirizine HCl 10 MG CAPS Take 1 tablet by mouth daily.   Yes Historical Provider, MD  estradiol (ESTRACE) 0.1 MG/GM vaginal cream Place vaginally.   Yes Historical Provider, MD  losartan (COZAAR) 100 MG tablet TAKE ONE TABLET BY MOUTH ONCE DAILY 01/02/15  Yes Glean Hess, MD  methimazole (TAPAZOLE) 5 MG tablet Take 1 tablet by mouth daily.   Yes Historical Provider, MD  MULTIPLE VITAMINS-MINERALS PO Take 1 tablet by mouth daily.   Yes Historical Provider, MD  OXYQUINOLONE SULFATE VAGINAL 0.025 % GEL Place 1 application vaginally once a week.   Yes Historical Provider, MD  predniSONE (STERAPRED UNI-PAK 21 TAB) 10 MG (21) TBPK tablet Take 1 tablet (10 mg total) by mouth daily. 04/23/15  Yes Glean Hess, MD    No Known Allergies  Past Surgical History  Procedure Laterality Date  . Oophorectomy Left 1970    Social History  Substance Use Topics  . Smoking status: Former Research scientist (life sciences)  . Smokeless tobacco: None  . Alcohol Use: No     Medication list has been reviewed and updated.   Physical Exam  Constitutional: She appears well-developed and well-nourished.  HENT:  Right Ear: Ear canal normal. Tympanic membrane is retracted.  Left Ear: Ear canal normal. Tympanic membrane  is retracted.  Mouth/Throat: Uvula is midline and oropharynx is clear and moist.  Eyes: Conjunctivae and EOM are normal. Pupils are equal, round, and reactive to light. Right eye exhibits no chemosis and  no exudate. Left eye exhibits no chemosis and no exudate. No scleral icterus.  Excessive clear tears bilaterally  Neck: Normal range of motion. Neck supple.  Cardiovascular: Normal rate, regular rhythm and normal heart sounds.   Pulmonary/Chest: Effort normal. She has decreased breath sounds in the right lower field and the left lower field. She has no wheezes. She has no rhonchi.  Lymphadenopathy:    She has no cervical adenopathy.  Psychiatric: She has a normal mood and affect.    BP 164/84 mmHg  Pulse 76  Ht 5' 4.75" (1.645 m)  Wt 152 lb 3.2 oz (69.037 kg)  BMI 25.51 kg/m2  Assessment and Plan: 1. Allergic conjunctivitis, bilateral Continue Zyrtec daily Ophthalmology exam to rule out other pathology  2. Other emphysema ([place]) Doing well on Symbicort without evidence that this is causing her allergy symptoms - I recommend continuing therapy  3. Essential (primary) hypertension Controlled   Halina Maidens, MD [facility]  05/22/2015

## 2015-07-03 ENCOUNTER — Other Ambulatory Visit: Payer: Self-pay | Admitting: Internal Medicine

## 2015-07-17 ENCOUNTER — Ambulatory Visit (INDEPENDENT_AMBULATORY_CARE_PROVIDER_SITE_OTHER): Payer: Medicare Other | Admitting: Obstetrics and Gynecology

## 2015-07-17 ENCOUNTER — Encounter: Payer: Self-pay | Admitting: Obstetrics and Gynecology

## 2015-07-17 VITALS — BP 165/78 | HR 75 | Ht 64.0 in | Wt 154.1 lb

## 2015-07-17 DIAGNOSIS — N814 Uterovaginal prolapse, unspecified: Secondary | ICD-10-CM | POA: Diagnosis not present

## 2015-07-17 DIAGNOSIS — S30814A Abrasion of vagina and vulva, initial encounter: Secondary | ICD-10-CM | POA: Diagnosis not present

## 2015-07-17 DIAGNOSIS — Z4689 Encounter for fitting and adjustment of other specified devices: Secondary | ICD-10-CM

## 2015-07-24 DIAGNOSIS — S30814A Abrasion of vagina and vulva, initial encounter: Secondary | ICD-10-CM | POA: Insufficient documentation

## 2015-07-24 NOTE — Progress Notes (Signed)
  Patient ID: Paige Hart, female   DOB: 1935-06-25, 80 y.o.   MRN: [id number]   Chief complaint: 1.  Pessary maintenance 2.  Cystocele. 3.  Uterine prolapse  Expand All Collapse All   Patient ID: Paige Hart, female DOB: [birth year], 80 y.o. MRN: [id number]  Chief complaint: 1. Pessary Maintenance 2. Cystocele. 3. Uterine prolapse   67monthf/.u Pessary check gellhorn 2 1/2 Estrace cream - weekly trimosan- weekly Vb,vd, vo- neg  Past medical history, past surgical history, prolapse, medications, allergies reviewed and updated.  OBJECTIVE: BP 165/78 mmHg  Pulse 75  Ht 5' 4"  (1.626 m)  Wt 154 lb 1.6 oz (69.899 kg)  BMI 26.44 kg/m2  Well-appearing white female in no acute distress. Alert and oriented. Abdomen: Soft, nontender, without organomegaly. Pelvic exam: External genitalia normal BUS-normal Vagina-mild atrophic changes with A 2 x 3 cm posterior vaginal Abrasion with slight pink discharge Cervix-no lesions. Uterus-nontender Adnexa-nontender. Rectovaginal-external exam normal.  Procedure: Gellhorn pessary is removed, cleaned, and replaced.  IMPRESSION: 1. Pessary check With evidence of posterior vaginal Abrasion 2. Uterovaginal prolapse, stable.  PLAN: 1. Continue with Estrace cream intravaginally weekly. 2. Continue with TRIMO San gel intravaginal weekly. 3. Return in 4 Weeks for pessary Removal 7-10 days  MBrayton Mars MD  A total of 15 minutes were spent face-to-face with the patient during this encounter and over half of that time dealt with counseling and coordination of care.  Note: This dictation was prepared with Dragon dictation along with smaller phrase technology. Any transcriptional errors that result from this process are unintentional.

## 2015-07-24 NOTE — Patient Instructions (Signed)
1.  Return in the third week of January 2017 for pessary removal for 7-10 days in order to allow the vaginal abrasion to heal. 2.  Continue using the Estrace cream and TRIMO san gel as directed.

## 2015-07-25 ENCOUNTER — Ambulatory Visit: Payer: Self-pay | Admitting: Obstetrics and Gynecology

## 2015-08-07 ENCOUNTER — Ambulatory Visit (INDEPENDENT_AMBULATORY_CARE_PROVIDER_SITE_OTHER): Payer: Medicare Other | Admitting: Obstetrics and Gynecology

## 2015-08-07 ENCOUNTER — Other Ambulatory Visit: Payer: Self-pay | Admitting: Obstetrics and Gynecology

## 2015-08-07 ENCOUNTER — Encounter: Payer: Self-pay | Admitting: Obstetrics and Gynecology

## 2015-08-07 VITALS — BP 161/67 | HR 80 | Ht 64.0 in | Wt 156.3 lb

## 2015-08-07 DIAGNOSIS — S30814A Abrasion of vagina and vulva, initial encounter: Secondary | ICD-10-CM | POA: Diagnosis not present

## 2015-08-07 DIAGNOSIS — N814 Uterovaginal prolapse, unspecified: Secondary | ICD-10-CM

## 2015-08-07 DIAGNOSIS — Z4689 Encounter for fitting and adjustment of other specified devices: Secondary | ICD-10-CM | POA: Diagnosis not present

## 2015-08-07 NOTE — Patient Instructions (Signed)
1.  Apply Estrace cream 1/2 g intravaginally nightly 2.  Return in 10 days for pessary insertion

## 2015-08-07 NOTE — Progress Notes (Signed)
Chief complaint: 1.  Vaginal abrasion.  Patient presents today for pessary removal and pelvic rest, to allow vaginal abrasion healed.  She has been using the gelhorn pessary for management of symptomatic pelvic relaxation, but at last visit was noted to have a vaginal abrasion, just prior to the holidays.  She now presents for removal of the pessary in order to allow the abrasion to heal.  Past medical history: Past surgical history, problem list, medications, and allergies are reviewed.  OBJECTIVE: BP 161/67 mmHg  Pulse 80  Ht 5' 4"  (1.626 m)  Wt 156 lb 4.8 oz (70.897 kg)  BMI 26.82 kg/m2   Well-appearing white female in no acute distress. Alert and oriented. Abdomen: Soft, nontender, without organomegaly. Pelvic exam: External genitalia normal BUS-normal Vagina-mild atrophic changes with A 2 x 3 cm posterior vaginal Abrasion with Yellow/white discharge; There is new malodor present Cervix-no lesions. Uterus-nontender Adnexa-nontender.  ASSESSMENT: 1.  Vaginal abrasion. 2.  Symptomatic pelvic relaxation(Uterovaginal prolapse), stable.  PLAN: 1.  Pessary is removed for 7-10 days. 2.  Apply Estrace cream 1/2 g intravaginally every night for 10 days. 3.  Return in 10 days for follow-up and probable pessary insertion.  A total of 15 minutes were spent face-to-face with the patient during this encounter and over half of that time dealt with counseling and coordination of care.  Brayton Mars, MD  Note: This dictation was prepared with Dragon dictation along with smaller phrase technology. Any transcriptional errors that result from this process are unintentional.

## 2015-08-16 ENCOUNTER — Encounter: Payer: Self-pay | Admitting: Obstetrics and Gynecology

## 2015-08-16 ENCOUNTER — Ambulatory Visit (INDEPENDENT_AMBULATORY_CARE_PROVIDER_SITE_OTHER): Payer: Medicare Other | Admitting: Obstetrics and Gynecology

## 2015-08-16 VITALS — BP 151/74 | HR 80 | Ht 64.0 in | Wt 154.4 lb

## 2015-08-16 DIAGNOSIS — N814 Uterovaginal prolapse, unspecified: Secondary | ICD-10-CM

## 2015-08-16 DIAGNOSIS — S30814D Abrasion of vagina and vulva, subsequent encounter: Secondary | ICD-10-CM

## 2015-08-16 NOTE — Progress Notes (Signed)
Chief complaint: 1.  Vaginal abrasion. 2.  Pelvic organ prolapse.  Patient presents today for follow-up on vaginal abrasion and possible pessary insertion.  She is using Premarin cream intravaginally every night for the past 2 weeks.  Past history, past surgical history, problem list, medications, and allergies are reviewed.  Review of systems: No vaginal discharge. No vaginal bleeding. No vaginal/pelvic pain. No UTI symptoms. No significant pelvic pressure or prolapse experienced since pessary removal.  OBJECTIVE: BP 151/74 mmHg  Pulse 80  Ht 5' 4"  (1.626 m)  Wt 154 lb 6.4 oz (70.035 kg)  BMI 26.49 kg/m2 Pleasant appearing white female in no acute distress. Abdomen: Soft, nontender. Pelvic exam: External genitalia-normal BUS-normal Vagina-fair estrogen effect; dime-sized abrasion/ulceration is noted posteriorly on the vaginal mucosa; no significant discharge.  ASSESSMENT: 1.  Vaginal abrasion, resolving but not completely healed 2.  Pelvic organ prolapse, minimally symptomatic this week.  PLAN: 1.  Continue with Premarin cream, intravaginal nightly 2.  Return in 1 week for recheck and probable gel horn pessary reinsertion.  Alanda Slim Nijel Flink, MD  A total of 15 minutes were spent face-to-face with the patient during this encounter and over half of that time dealt with counseling and coordination of care.  Note: This dictation was prepared with Dragon dictation along with smaller phrase technology. Any transcriptional errors that result from this process are unintentional.

## 2015-08-22 ENCOUNTER — Encounter: Payer: Self-pay | Admitting: Obstetrics and Gynecology

## 2015-08-22 ENCOUNTER — Ambulatory Visit (INDEPENDENT_AMBULATORY_CARE_PROVIDER_SITE_OTHER): Payer: Medicare Other | Admitting: Obstetrics and Gynecology

## 2015-08-22 VITALS — BP 143/85 | HR 58 | Wt 154.1 lb

## 2015-08-22 DIAGNOSIS — S30814D Abrasion of vagina and vulva, subsequent encounter: Secondary | ICD-10-CM

## 2015-08-22 DIAGNOSIS — N814 Uterovaginal prolapse, unspecified: Secondary | ICD-10-CM | POA: Diagnosis not present

## 2015-08-22 DIAGNOSIS — R350 Frequency of micturition: Secondary | ICD-10-CM

## 2015-08-22 LAB — POCT URINALYSIS DIPSTICK
BILIRUBIN UA: NEGATIVE
Blood, UA: NEGATIVE
Glucose, UA: NEGATIVE
Ketones, UA: NEGATIVE
LEUKOCYTES UA: NEGATIVE
Nitrite, UA: NEGATIVE
PH UA: 6
Protein, UA: NEGATIVE
SPEC GRAV UA: 1.025
Urobilinogen, UA: NEGATIVE

## 2015-08-22 NOTE — Patient Instructions (Signed)
1.  The Gellhorn pessary is inserted today. 2.  Continue to use Premarin cream once a week. 3.  Continue to use TRIMO San gel once a week. 4.   Return in 2 months for follow-up. 5.  Urinalysis with urine culture is obtained today

## 2015-08-22 NOTE — Progress Notes (Signed)
Chief complaint: 1.  Vaginal abrasion. 2.  Pelvic organ prolapse 3.  Urinary frequency  Patient presents today for follow-up on vaginal abrasion and possible pessary insertion. She is using Premarin cream intravaginally every night for the past 3 weeks.  Past history, past surgical history, problem list, medications, and allergies are reviewed.  Review of systems: No vaginal discharge. No vaginal bleeding. No vaginal/pelvic pain. Urinary frequency present No significant pelvic pressure or prolapse experienced since pessary removal.  OBJECTIVE:  Pleasant appearing white female in no acute distress. Abdomen: Soft, nontender. Pelvic exam: External genitalia-normal BUS-normal Vagina-fair estrogen effect; 0.25 inch-sized Granulation present (overall resolution of abrasion); no significant discharge.  ASSESSMENT: 1. Vaginal abrasion, 90% resolved 2. Pelvic organ prolapse, minimally symptomatic this week. 3.  Urinary frequency  PLAN: 1. Continue with Premarin cream Weekly 2.  Continue with TRIMO san gel weekly 3. Return 2 months for pessary maintenance 4.  Urinalysis with urine culture is obtained  Brayton Mars, MD  A total of 15 minutes were spent face-to-face with the patient during this encounter and over half of that time dealt with counseling and coordination of care.  Note: This dictation was prepared with Dragon dictation along with smaller phrase technology. Any transcriptional errors that result from this process are unintentional.

## 2015-08-23 LAB — URINE CULTURE: ORGANISM ID, BACTERIA: NO GROWTH

## 2015-08-28 DIAGNOSIS — E0520 Thyrotoxicosis with toxic multinodular goiter without thyrotoxic crisis or storm: Secondary | ICD-10-CM | POA: Diagnosis not present

## 2015-09-04 DIAGNOSIS — E0520 Thyrotoxicosis with toxic multinodular goiter without thyrotoxic crisis or storm: Secondary | ICD-10-CM | POA: Diagnosis not present

## 2015-10-18 ENCOUNTER — Encounter: Payer: Self-pay | Admitting: Obstetrics and Gynecology

## 2015-10-18 ENCOUNTER — Ambulatory Visit (INDEPENDENT_AMBULATORY_CARE_PROVIDER_SITE_OTHER): Payer: Medicare Other | Admitting: Obstetrics and Gynecology

## 2015-10-18 VITALS — BP 159/72 | HR 72 | Ht 64.0 in | Wt 153.0 lb

## 2015-10-18 DIAGNOSIS — N814 Uterovaginal prolapse, unspecified: Secondary | ICD-10-CM | POA: Diagnosis not present

## 2015-10-18 DIAGNOSIS — Z4689 Encounter for fitting and adjustment of other specified devices: Secondary | ICD-10-CM

## 2015-10-18 DIAGNOSIS — S30814D Abrasion of vagina and vulva, subsequent encounter: Secondary | ICD-10-CM

## 2015-10-18 NOTE — Progress Notes (Signed)
GYN ENCOUNTER NOTE  Subjective:       Paige Hart is a 81 y.o. No obstetric history on file. female is here for gynecologic evaluation of the following issues:  1. Pessary follow-up   Patient presents for scheduled 62-monthpessary check.  Pessary was removed for a few weeks in January for pelvic rest due to vaginal abrasion, then reinserted on 1/25 - since then patient is happy with the Gel Horn; denies itching, irritation, discharge, discomfort.  She uses both Premarin and Trimo-san get once weekly.  Gynecologic History No LMP recorded. Patient is postmenopausal. Contraception: post menopausal status Last Pap: unknown. Results were: N/A Last mammogram: unknown. Results were: N/A  Obstetric History OB History  No data available    Past Medical History  Diagnosis Date  . [place]   . Urge incontinence   . Procidentia of uterus   . Hypertension   . Vaginal atrophy   . Cystocele   . Thyroid disease     Past Surgical History  Procedure Laterality Date  . Oophorectomy Left 1970    Current Outpatient Prescriptions on File Prior to Visit  Medication Sig Dispense Refill  . budesonide-formoterol (SYMBICORT) 160-4.5 MCG/ACT inhaler Inhale 1 puff into the lungs 2 (two) times daily.    . Cetirizine HCl 10 MG CAPS Take 1 tablet by mouth daily.    .Marland KitchenESTRACE VAGINAL 0.1 MG/GM vaginal cream INSERT 0.5GRAMS VAGINALLY ONCE WEEKLY 45 g 1  . losartan (COZAAR) 100 MG tablet TAKE ONE TABLET BY MOUTH ONCE DAILY 30 tablet 5  . methimazole (TAPAZOLE) 5 MG tablet Take 1 tablet by mouth daily.    . MULTIPLE VITAMINS-MINERALS PO Take 1 tablet by mouth daily.    .Levin ErpSULFATE VAGINAL 0.025 % GEL Place 1 application vaginally once a week.     No current facility-administered medications on file prior to visit.    No Known Allergies  Social History   Social History  . Marital Status: Widowed    Spouse Name: N/A  . Number of Children: N/A  . Years of Education: N/A    Occupational History  . Not on file.   Social History Main Topics  . Smoking status: Former SResearch scientist (life sciences) . Smokeless tobacco: Not on file  . Alcohol Use: No  . Drug Use: No  . Sexual Activity: Not Currently   Other Topics Concern  . Not on file   Social History Narrative    Family History  Problem Relation Age of Onset  . Mental illness Sister   . Ovarian cancer Mother   . Diabetes Neg Hx   . Heart disease Neg Hx   . Breast cancer Neg Hx   . Colon cancer Neg Hx     The following portions of the patient's history were reviewed and updated as appropriate: allergies, current medications, past family history, past medical history, past social history, past surgical history and problem list.  Review of Systems Review of Systems - General ROS: negative for - chills, fatigue, fever, hot flashes, malaise or night sweats Hematological and Lymphatic ROS: negative for - bleeding problems or swollen lymph nodes Gastrointestinal ROS: negative for - abdominal pain, blood in stools, change in bowel habits and nausea/vomiting Musculoskeletal ROS: negative for - joint pain, muscle pain or muscular weakness Genito-Urinary ROS: negative for - change in menstrual cycle, dysmenorrhea, dyspareunia, dysuria, genital discharge, genital ulcers, hematuria, incontinence, irregular/heavy menses, nocturia or pelvic pain. Positive for mild urinary frequency  Objective:   BP 159/72  mmHg  Pulse 72  Ht 5' 4"  (1.626 m)  Wt 153 lb (69.4 kg)  BMI 26.25 kg/m2 CONSTITUTIONAL: Well-developed, well-nourished female in no acute distress.  PELVIC:  External Genitalia: Normal  BUS: Normal  Vagina: Fair, estrogen effect. Posterior hyperemia approx 2cm, no ulceration  Cervix: Normal  Uterus: Normal size, shape, consistency, mobile  RV: Normal    Assessment:   1. Pessary check, Stable 2. Pelvic organ prolapse 3. Urinary frequency   Plan:   1. Continue with Premarin cream Weekly 2. Continue with  TRIMO san gel weekly 3. Return 10 weeks for pessary maintenance   Olene Floss, PA-S Brayton Mars, MD   I have seen, interviewed, and examined the patient in conjunction with the [facility].A. student and affirm the diagnosis and management plan. Haizel Gatchell A. Minnie Legros, MD, FACOG   Note: This dictation was prepared with Dragon dictation along with smaller phrase technology. Any transcriptional errors that result from this process are unintentional.

## 2015-10-18 NOTE — Patient Instructions (Signed)
1. Return in 10 weeks for pessary maintenance 2. Continue using from Trimosan gel weekly and Premarin cream weekly as directed

## 2015-12-21 ENCOUNTER — Other Ambulatory Visit: Payer: Self-pay | Admitting: Internal Medicine

## 2015-12-27 ENCOUNTER — Ambulatory Visit (INDEPENDENT_AMBULATORY_CARE_PROVIDER_SITE_OTHER): Payer: Medicare Other | Admitting: Obstetrics and Gynecology

## 2015-12-27 ENCOUNTER — Encounter: Payer: Self-pay | Admitting: Obstetrics and Gynecology

## 2015-12-27 VITALS — BP 183/73 | HR 75 | Ht 64.0 in | Wt 152.0 lb

## 2015-12-27 DIAGNOSIS — N813 Complete uterovaginal prolapse: Secondary | ICD-10-CM | POA: Diagnosis not present

## 2015-12-27 DIAGNOSIS — N8111 Cystocele, midline: Secondary | ICD-10-CM | POA: Insufficient documentation

## 2015-12-27 DIAGNOSIS — N8110 Cystocele, unspecified: Secondary | ICD-10-CM | POA: Diagnosis not present

## 2015-12-27 DIAGNOSIS — Z4689 Encounter for fitting and adjustment of other specified devices: Secondary | ICD-10-CM | POA: Diagnosis not present

## 2015-12-27 DIAGNOSIS — IMO0002 Reserved for concepts with insufficient information to code with codable children: Secondary | ICD-10-CM

## 2015-12-27 NOTE — Progress Notes (Signed)
Chief complaint: 1. Cystocele 2. Vaginal atrophy 3. Procidentia 4. Pessary maintenance  10 week pessary check History of vaginal abrasion in January 2017 Gellhorn pessary Premarin intravaginally once a week Trimosan gel intravaginally once a week  No vaginal discharge, vaginal bleeding, vaginal odor, or vaginal pain  OBJECTIVE: BP 183/73 mmHg  Pulse 75  Ht 5' 4"  (1.626 m)  Wt 152 lb (68.947 kg)  BMI 26.08 kg/m2 CONSTITUTIONAL: Well-developed, well-nourished female in no acute distress.  PELVIC: External Genitalia: Normal BUS: Normal Vagina: Fair, estrogen effect. Posterior hyperemia approx 2cm, no ulceration. Minimal odor Cervix: Normal Uterus: Normal size, shape, consistency, mobile RV: Normal   PROCEDURE: Pessary is removed, cleaned, and reinserted  ASSESSMENT: 1. Pessary maintenance-normal 2. Uterine procidentia 3. Cystocele 4. Vaginal atrophy  PLAN: 1. Continue with Premarin cream Weekly 2. Continue with TRIMO san gel weekly 3. Return 12 weeks for pessary maintenance  A total of 15 minutes were spent face-to-face with the patient during this encounter and over half of that time dealt with counseling and coordination of care.   Brayton Mars, MD  Note: This dictation was prepared with Dragon dictation along with smaller phrase technology. Any transcriptional errors that result from this process are unintentional.

## 2015-12-27 NOTE — Patient Instructions (Signed)
1. Return in 12 weeks for pessary maintenance 2. Continue using Premarin cream once a week in the vagina 3. Continue using Trimosan gel once a week in the vagina

## 2016-01-02 ENCOUNTER — Other Ambulatory Visit: Payer: Self-pay | Admitting: Internal Medicine

## 2016-02-25 DIAGNOSIS — E0520 Thyrotoxicosis with toxic multinodular goiter without thyrotoxic crisis or storm: Secondary | ICD-10-CM | POA: Diagnosis not present

## 2016-03-03 DIAGNOSIS — E0520 Thyrotoxicosis with toxic multinodular goiter without thyrotoxic crisis or storm: Secondary | ICD-10-CM | POA: Diagnosis not present

## 2016-03-20 ENCOUNTER — Ambulatory Visit: Payer: Self-pay | Admitting: Obstetrics and Gynecology

## 2016-03-24 ENCOUNTER — Other Ambulatory Visit: Payer: Self-pay

## 2016-03-27 ENCOUNTER — Encounter: Payer: Self-pay | Admitting: Internal Medicine

## 2016-03-27 ENCOUNTER — Ambulatory Visit (INDEPENDENT_AMBULATORY_CARE_PROVIDER_SITE_OTHER): Payer: Medicare Other | Admitting: Internal Medicine

## 2016-03-27 VITALS — BP 136/82 | HR 74 | Resp 16 | Ht 64.0 in | Wt 148.0 lb

## 2016-03-27 DIAGNOSIS — Z0000 Encounter for general adult medical examination without abnormal findings: Secondary | ICD-10-CM

## 2016-03-27 DIAGNOSIS — E782 Mixed hyperlipidemia: Secondary | ICD-10-CM | POA: Diagnosis not present

## 2016-03-27 DIAGNOSIS — J438 Other emphysema: Secondary | ICD-10-CM

## 2016-03-27 DIAGNOSIS — I10 Essential (primary) hypertension: Secondary | ICD-10-CM

## 2016-03-27 DIAGNOSIS — E0500 Thyrotoxicosis with diffuse goiter without thyrotoxic crisis or storm: Secondary | ICD-10-CM

## 2016-03-27 DIAGNOSIS — J302 Other seasonal allergic rhinitis: Secondary | ICD-10-CM | POA: Diagnosis not present

## 2016-03-27 LAB — POCT URINALYSIS DIPSTICK
BILIRUBIN UA: NEGATIVE
GLUCOSE UA: NEGATIVE
Ketones, UA: NEGATIVE
NITRITE UA: NEGATIVE
PH UA: 6
Protein, UA: NEGATIVE
RBC UA: NEGATIVE
Spec Grav, UA: 1.01

## 2016-03-27 MED ORDER — BUDESONIDE-FORMOTEROL FUMARATE 160-4.5 MCG/ACT IN AERO: 2.0000 | INHALATION_SPRAY | Freq: Two times a day (BID) | RESPIRATORY_TRACT | 12 refills | Status: DC

## 2016-03-27 MED ORDER — ALBUTEROL SULFATE HFA 108 (90 BASE) MCG/ACT IN AERS: 2.0000 | INHALATION_SPRAY | RESPIRATORY_TRACT | 12 refills | Status: DC | PRN

## 2016-03-27 NOTE — Progress Notes (Signed)
Patient: Paige Hart, Female    DOB: 1935-06-04, 81 y.o.   MRN: [id number] Visit Date: 03/27/2016  Today's Provider: Halina Maidens, MD   Chief Complaint  Patient presents with  . Medicare Wellness   Subjective:    Annual wellness visit Dan Dissinger is a 81 y.o. female who presents today for her Subsequent Annual Wellness Visit. She feels fairly well. She reports exercising some. She reports she is sleeping fairly well.   ----------------------------------------------------------- Hypertension  This is a chronic problem. The current episode started more than 1 year ago. The problem is unchanged. The problem is controlled. Associated symptoms include shortness of breath. Pertinent negatives include no chest pain, headaches or palpitations. Hypertensive end-organ damage includes a thyroid problem.  Hyperlipidemia  This is a chronic problem. Recent lipid tests were reviewed and are variable. Associated symptoms include shortness of breath. Pertinent negatives include no chest pain. Current antihyperlipidemic treatment includes diet change.  Thyroid Problem  Presents for follow-up (recently seen by Endo and medication stopped) visit. Patient reports no anxiety, constipation, diarrhea, palpitations or tremors. Her past medical history is significant for hyperlipidemia.  COPD - off and on an issue. Still problems with exertion or outside work.  Using symbicort at least once every day. She does not have a rescue inhaler.  Review of Systems  Constitutional: Negative for chills, fever and unexpected weight change.  HENT: Negative for hearing loss.   Eyes: Negative for visual disturbance.  Respiratory: Positive for shortness of breath and wheezing. Negative for chest tightness.   Cardiovascular: Negative for chest pain, palpitations and leg swelling.  Gastrointestinal: Negative for abdominal pain, constipation and diarrhea.  Genitourinary: Negative for difficulty urinating, frequency,  vaginal bleeding and vaginal discharge.  Musculoskeletal: Negative for arthralgias and gait problem.  Neurological: Negative for dizziness, tremors, seizures and headaches.  Hematological: Negative for adenopathy. Does not bruise/bleed easily.  Psychiatric/Behavioral: Negative for dysphoric mood and sleep disturbance. The patient is not nervous/anxious.     Social History   Social History  . Marital status: Widowed    Spouse name: N/A  . Number of children: N/A  . Years of education: N/A   Occupational History  . Not on file.   Social History Main Topics  . Smoking status: Former Research scientist (life sciences)  . Smokeless tobacco: Never Used  . Alcohol use No  . Drug use: No  . Sexual activity: Not Currently   Other Topics Concern  . Not on file   Social History Narrative  . No narrative on file    Patient Active Problem List   Diagnosis Date Noted  . Cystocele 12/27/2015  . Uterine procidentia 12/27/2015  . Pessary maintenance 03/22/2015  . Allergic conjunctivitis 11/17/2014  . Allergic rhinitis 11/17/2014  . Essential (primary) hypertension 11/17/2014  . Graves disease 11/17/2014  . Bursitis of hip 11/17/2014  . Combined fat and carbohydrate induced hyperlipemia 11/17/2014  . Chronic obstructive pulmonary emphysema ([place]) 11/17/2014  . Pelvic relaxation due to uterovaginal prolapse - pessary use 11/17/2014    Past Surgical History:  Procedure Laterality Date  . OOPHORECTOMY Left 1970    Her family history includes Mental illness in her sister; Ovarian cancer in her mother.    Previous Medications   CETIRIZINE HCL 10 MG CAPS    Take 1 tablet by mouth daily.   ESTRACE VAGINAL 0.1 MG/GM VAGINAL CREAM    INSERT 0.5GRAMS VAGINALLY ONCE WEEKLY   LOSARTAN (COZAAR) 100 MG TABLET    TAKE ONE TABLET BY MOUTH ONCE DAILY  MULTIPLE VITAMINS-MINERALS PO    Take 1 tablet by mouth daily.   OXYQUINOLONE SULFATE VAGINAL 0.025 % GEL    Place 1 application vaginally once a week.   SYMBICORT  160-4.5 MCG/ACT INHALER    INHALE ONE PUFF BY MOUTH TWICE DAILY. RINSE MOUTH AFTER EACH INHALATION.    Patient Care Team: Glean Hess, MD as PCP - General (Family Medicine) Judi Cong, MD as Physician Assistant (Endocrinology) Brayton Mars, MD as Consulting Physician (Obstetrics and Gynecology)     Objective:   Vitals: BP 136/82   Pulse 74   Resp 16   Ht 5' 4"  (1.626 m)   Wt 148 lb (67.1 kg)   SpO2 95%   BMI 25.40 kg/m   Physical Exam  Constitutional: She is oriented to person, place, and time. She appears well-developed and well-nourished. No distress.  HENT:  Head: Normocephalic and atraumatic.  Right Ear: Tympanic membrane and ear canal normal.  Left Ear: Tympanic membrane and ear canal normal.  Nose: Right sinus exhibits no maxillary sinus tenderness. Left sinus exhibits no maxillary sinus tenderness.  Mouth/Throat: Uvula is midline and oropharynx is clear and moist.  Eyes: Conjunctivae and EOM are normal. Right eye exhibits no discharge. Left eye exhibits no discharge. No scleral icterus.  Neck: Normal range of motion. Carotid bruit is not present. No erythema present. No thyromegaly present.  Cardiovascular: Normal rate, regular rhythm, normal heart sounds and normal pulses.   Pulmonary/Chest: Effort normal. No respiratory distress. She has no wheezes. Right breast exhibits no mass, no nipple discharge, no skin change and no tenderness. Left breast exhibits no mass, no nipple discharge, no skin change and no tenderness.  Abdominal: Soft. Bowel sounds are normal. There is no hepatosplenomegaly. There is no tenderness. There is no CVA tenderness.  Musculoskeletal: Normal range of motion.  Lymphadenopathy:    She has no cervical adenopathy.    She has no axillary adenopathy.  Neurological: She is alert and oriented to person, place, and time. She has normal reflexes. No cranial nerve deficit or sensory deficit.  Skin: Skin is warm, dry and intact. No rash  noted.  Psychiatric: She has a normal mood and affect. Her speech is normal and behavior is normal. Thought content normal.  Nursing note and vitals reviewed.   Activities of Daily Living In your present state of health, do you have any difficulty performing the following activities: 03/27/2016  Hearing? N  Vision? N  Difficulty concentrating or making decisions? N  Walking or climbing stairs? N  Dressing or bathing? N  Doing errands, shopping? N  Preparing Food and eating ? N  Using the Toilet? N  In the past six months, have you accidently leaked urine? N  Do you have problems with loss of bowel control? N  Managing your Medications? N  Managing your Finances? N  Housekeeping or managing your Housekeeping? N  Some recent data might be hidden    Fall Risk Assessment Fall Risk  03/27/2016 01/18/2015  Falls in the past year? No No      Depression Screen PHQ 2/9 Scores 03/27/2016 01/18/2015  PHQ - 2 Score 0 0    Cognitive Testing - 6-CIT   Correct? Score   What year is it? yes 0 Yes = 0    No = 4  What month is it? yes 0 Yes = 0    No = 3  Remember:     [address]ia [address], [place], [place]  What time is it? yes 0 Yes = 0    No = 3  Count backwards from 20 to 1 yes 0 Correct = 0    1 error = 2   More than 1 error = 4  Say the months of the year in reverse. yes 0 Correct = 0    1 error = 2   More than 1 error = 4  What address did I ask you to remember? yes 0 Correct = 0  1 error = 2    2 error = 4    3 error = 6    4 error = 8    All wrong = 10       TOTAL SCORE  0/28   Interpretation:  Normal  Normal (0-7) Abnormal (8-28)        Medicare Annual Wellness Visit Summary:  Reviewed patient's Family Medical History Reviewed and updated list of patient's medical providers Assessment of cognitive impairment was done Assessed patient's functional ability Established a written schedule for health screening [place] Completed and  Reviewed  Exercise Activities and Dietary recommendations Goals    . Activity          Stay active and keep doing gardening and other yard work.     . Reduce calorie intake to 2000 calories per day          She is starting a new diet/cleanse - she only consumes 500 cal two days per week and eats normally on the other days.       Immunization History  Administered Date(s) Administered  . Influenza,inj,Quad PF,36+ Mos 05/08/2015  . Pneumococcal Conjugate-13 01/18/2015  . Pneumococcal Polysaccharide-23 11/16/2013    Health Maintenance  Topic Date Due  . TETANUS/TDAP  08/30/1955  . ZOSTAVAX  08/29/1996  . DEXA SCAN  08/29/2001  . INFLUENZA VACCINE  02/26/2016  . PNA vac Low Risk Adult  Completed     Discussed health benefits of physical activity, and encouraged her to engage in regular exercise appropriate for her age and condition.    ------------------------------------------------------------------------------------------------------------   Assessment & Plan:  1. Medicare annual wellness visit, subsequent Pt declines TDaP and Zostavax Pt declines mammogram Other measures satisfied  2. Essential (primary) hypertension controlled - CBC with Differential/Platelet - Comprehensive metabolic panel - POCT urinalysis dipstick  3. Other seasonal allergic rhinitis Try claritin instead of zyrtec to reduce sedation  4. Other emphysema ([place]) Needs rescue MDI - albuterol (PROVENTIL HFA;VENTOLIN HFA) 108 (90 Base) MCG/ACT inhaler; Inhale 2 puffs into the lungs every 4 (four) hours as needed for wheezing or shortness of breath.  Dispense: 1 Inhaler; Refill: 12 - budesonide-formoterol (SYMBICORT) 160-4.5 MCG/ACT inhaler; Inhale 2 puffs into the lungs 2 (two) times daily.  Dispense: 10.2 g; Refill: 12  5. Graves disease Followed by Endo Recently stopped medication  6. Combined fat and carbohydrate induced hyperlipemia Did not take medication - continues low fat diet - Lipid  panel   Halina Maidens, MD [facility]  03/27/2016

## 2016-03-27 NOTE — Patient Instructions (Signed)
Health Maintenance  Topic Date Due  . DEXA SCAN  08/29/2001  . INFLUENZA VACCINE  02/26/2016  . ZOSTAVAX  03/27/2017 (Originally 08/29/1996)  . TETANUS/TDAP  03/27/2017 (Originally 08/30/1955)  . PNA vac Low Risk Adult  Completed    Breast Self-Awareness Practicing breast self-awareness may pick up problems early, prevent significant medical complications, and possibly save your life. By practicing breast self-awareness, you can become familiar with how your breasts look and feel and if your breasts are changing. This allows you to notice changes early. It can also offer you some reassurance that your breast health is good. One way to learn what is normal for your breasts and whether your breasts are changing is to do a breast self-exam. If you find a lump or something that was not present in the past, it is best to contact your caregiver right away. Other findings that should be evaluated by your caregiver include nipple discharge, especially if it is bloody; skin changes or reddening; areas where the skin seems to be pulled in (retracted); or new lumps and bumps. Breast pain is seldom associated with cancer (malignancy), but should also be evaluated by a caregiver. HOW TO PERFORM A BREAST SELF-EXAM The best time to examine your breasts is 5-7 days after your menstrual period is over. During menstruation, the breasts are lumpier, and it may be more difficult to pick up changes. If you do not menstruate, have reached menopause, or had your uterus removed (hysterectomy), you should examine your breasts at regular intervals, such as monthly. If you are breastfeeding, examine your breasts after a feeding or after using a breast pump. Breast implants do not decrease the risk for lumps or tumors, so continue to perform breast self-exams as recommended. Talk to your caregiver about how to determine the difference between the implant and breast tissue. Also, talk about the amount of pressure you should use during  the exam. Over time, you will become more familiar with the variations of your breasts and more comfortable with the exam. A breast self-exam requires you to remove all your clothes above the waist. 1. Look at your breasts and nipples. Stand in front of a mirror in a room with good lighting. With your hands on your hips, push your hands firmly downward. Look for a difference in shape, contour, and size from one breast to the other (asymmetry). Asymmetry includes puckers, dips, or bumps. Also, look for skin changes, such as reddened or scaly areas on the breasts. Look for nipple changes, such as discharge, dimpling, repositioning, or redness. 2. Carefully feel your breasts. This is best done either in the shower or tub while using soapy water or when flat on your back. Place the arm (on the side of the breast you are examining) above your head. Use the pads (not the fingertips) of your three middle fingers on your opposite hand to feel your breasts. Start in the underarm area and use  inch (2 cm) overlapping circles to feel your breast. Use 3 different levels of pressure (light, medium, and firm pressure) at each circle before moving to the next circle. The light pressure is needed to feel the tissue closest to the skin. The medium pressure will help to feel breast tissue a little deeper, while the firm pressure is needed to feel the tissue close to the ribs. Continue the overlapping circles, moving downward over the breast until you feel your ribs below your breast. Then, move one finger-width towards the center of the body.  Continue to use the  inch (2 cm) overlapping circles to feel your breast as you move slowly up toward the collar bone (clavicle) near the base of the neck. Continue the up and down exam using all 3 pressures until you reach the middle of the chest. Do this with each breast, carefully feeling for lumps or changes. 3.  Keep a written record with breast changes or normal findings for each  breast. By writing this information down, you do not need to depend only on memory for size, tenderness, or location. Write down where you are in your menstrual cycle, if you are still menstruating. Breast tissue can have some lumps or thick tissue. However, see your caregiver if you find anything that concerns you.  SEEK MEDICAL CARE IF:  You see a change in shape, contour, or size of your breasts or nipples.   You see skin changes, such as reddened or scaly areas on the breasts or nipples.   You have an unusual discharge from your nipples.   You feel a new lump or unusually thick areas.    This information is not intended to replace advice given to you by your health care provider. Make sure you discuss any questions you have with your health care provider.   Document Released: 07/14/2005 Document Revised: 06/30/2012 Document Reviewed: 10/29/2011 Elsevier Interactive Patient Education [employer].

## 2016-03-28 LAB — COMPREHENSIVE METABOLIC PANEL
A/G RATIO: 1.6 (ref 1.2–2.2)
ALBUMIN: 4.2 g/dL (ref 3.5–4.8)
ALK PHOS: 74 IU/L (ref 39–117)
ALT: 13 IU/L (ref 0–32)
AST: 14 IU/L (ref 0–40)
BUN / CREAT RATIO: 25 (ref 12–28)
BUN: 23 mg/dL (ref 8–27)
CO2: 23 mmol/L (ref 18–29)
Calcium: 9.2 mg/dL (ref 8.7–10.3)
Chloride: 100 mmol/L (ref 96–106)
Creatinine, Ser: 0.92 mg/dL (ref 0.57–1.00)
GFR calc Af Amer: 68 mL/min/{1.73_m2} (ref >59)
GFR calc non Af Amer: 59 mL/min/{1.73_m2} — ABNORMAL LOW (ref >59)
GLOBULIN, TOTAL: 2.6 g/dL (ref 1.5–4.5)
Glucose: 77 mg/dL (ref 65–99)
POTASSIUM: 4.7 mmol/L (ref 3.5–5.2)
SODIUM: 142 mmol/L (ref 134–144)
Total Protein: 6.8 g/dL (ref 6.0–8.5)

## 2016-03-28 LAB — CBC WITH DIFFERENTIAL/PLATELET
BASOS: 1 %
Basophils Absolute: 0.1 10*3/uL (ref 0.0–0.2)
EOS (ABSOLUTE): 0.2 10*3/uL (ref 0.0–0.4)
EOS: 3 %
HEMATOCRIT: 41.6 % (ref 34.0–46.6)
HEMOGLOBIN: 13.4 g/dL (ref 11.1–15.9)
Immature Grans (Abs): 0 10*3/uL (ref 0.0–0.1)
Immature Granulocytes: 0 %
LYMPHS ABS: 1.8 10*3/uL (ref 0.7–3.1)
Lymphs: 21 %
MCH: 28.6 pg (ref 26.6–33.0)
MCHC: 32.2 g/dL (ref 31.5–35.7)
MCV: 89 fL (ref 79–97)
MONOS ABS: 0.8 10*3/uL (ref 0.1–0.9)
Monocytes: 9 %
NEUTROS ABS: 5.6 10*3/uL (ref 1.4–7.0)
Neutrophils: 66 %
Platelets: 258 10*3/uL (ref 150–379)
RBC: 4.68 x10E6/uL (ref 3.77–5.28)
RDW: 14.4 % (ref 12.3–15.4)
WBC: 8.4 10*3/uL (ref 3.4–10.8)

## 2016-03-28 LAB — LIPID PANEL
CHOLESTEROL TOTAL: 173 mg/dL (ref 100–199)
Chol/HDL Ratio: 2.8 ratio units (ref 0.0–4.4)
HDL: 62 mg/dL (ref >39)
LDL Calculated: 72 mg/dL (ref 0–99)
TRIGLYCERIDES: 196 mg/dL — AB (ref 0–149)
VLDL Cholesterol Cal: 39 mg/dL (ref 5–40)

## 2016-04-02 ENCOUNTER — Ambulatory Visit (INDEPENDENT_AMBULATORY_CARE_PROVIDER_SITE_OTHER): Payer: Medicare Other | Admitting: Obstetrics and Gynecology

## 2016-04-02 VITALS — BP 167/75 | HR 76 | Ht 64.0 in | Wt 147.0 lb

## 2016-04-02 DIAGNOSIS — Z4689 Encounter for fitting and adjustment of other specified devices: Secondary | ICD-10-CM | POA: Diagnosis not present

## 2016-04-02 DIAGNOSIS — IMO0002 Reserved for concepts with insufficient information to code with codable children: Secondary | ICD-10-CM

## 2016-04-02 DIAGNOSIS — N813 Complete uterovaginal prolapse: Secondary | ICD-10-CM | POA: Diagnosis not present

## 2016-04-02 DIAGNOSIS — N8110 Cystocele, unspecified: Secondary | ICD-10-CM

## 2016-04-02 NOTE — Progress Notes (Signed)
Chief complaint: 1. Cystocele 2. Vaginal atrophy 3. Procidentia 4. Pessary maintenance (last visit 12/27/2015 (  12 week pessary check History of vaginal abrasion in January 2017 Gellhorn pessary Premarin intravaginally once a week Trimosan gel intravaginally once a week  No vaginal discharge, vaginal bleeding, vaginal odor, or vaginal pain  OBJECTIVE: BP 183/73 mmHg  Pulse 75  Ht 5' 4"  (1.626 m)  Wt 152 lb (68.947 kg)  BMI 26.08 kg/m2 CONSTITUTIONAL: Well-developed, well-nourished female in no acute distress.  PELVIC: External Genitalia: Normal BUS: Normal Vagina: Fair, estrogen effect. Posterior hyperemia approx 2cm, no ulceration. Minimal odor Cervix: Normal; 1 cm posterior cervical polyp at 6:00, nonfriable Uterus: Normal size, shape, consistency, mobile RV: Normal external exam  PROCEDURE: Pessary is removed, cleaned, and reinserted  ASSESSMENT: 1. Pessary maintenance-normal 2. Uterine procidentia 3. Cystocele 4. Vaginal atrophy  PLAN: 1. Continue with Premarin cream Weekly 2. Continue with TRIMO san gel weekly 3. Return 12 weeks for pessary maintenance  A total of 15 minutes were spent face-to-face with the patient during this encounter and over half of that time dealt with counseling and coordination of care.  Brayton Mars, MD  Note: This dictation was prepared with Dragon dictation along with smaller phrase technology. Any transcriptional errors that result from this process are unintentional.

## 2016-04-02 NOTE — Patient Instructions (Signed)
1. Continue with Premarin cream Weekly 2. Continue with TRIMO san gel weekly 3. Return 12 weeks for pessary maintenance

## 2016-05-19 DIAGNOSIS — H25013 Cortical age-related cataract, bilateral: Secondary | ICD-10-CM | POA: Diagnosis not present

## 2016-06-02 ENCOUNTER — Ambulatory Visit (INDEPENDENT_AMBULATORY_CARE_PROVIDER_SITE_OTHER): Payer: Medicare Other

## 2016-06-02 DIAGNOSIS — Z23 Encounter for immunization: Secondary | ICD-10-CM

## 2016-06-16 DIAGNOSIS — E0520 Thyrotoxicosis with toxic multinodular goiter without thyrotoxic crisis or storm: Secondary | ICD-10-CM | POA: Diagnosis not present

## 2016-06-16 LAB — TSH: TSH: 2.79 (ref 0.41–5.90)

## 2016-06-23 DIAGNOSIS — E0520 Thyrotoxicosis with toxic multinodular goiter without thyrotoxic crisis or storm: Secondary | ICD-10-CM | POA: Diagnosis not present

## 2016-06-24 ENCOUNTER — Ambulatory Visit (INDEPENDENT_AMBULATORY_CARE_PROVIDER_SITE_OTHER): Payer: Medicare Other | Admitting: Obstetrics and Gynecology

## 2016-06-24 ENCOUNTER — Encounter: Payer: Self-pay | Admitting: Obstetrics and Gynecology

## 2016-06-24 VITALS — BP 152/63 | HR 84 | Ht 64.0 in | Wt 148.2 lb

## 2016-06-24 DIAGNOSIS — R351 Nocturia: Secondary | ICD-10-CM

## 2016-06-24 DIAGNOSIS — Z4689 Encounter for fitting and adjustment of other specified devices: Secondary | ICD-10-CM

## 2016-06-24 DIAGNOSIS — N813 Complete uterovaginal prolapse: Secondary | ICD-10-CM | POA: Diagnosis not present

## 2016-06-24 LAB — POCT URINALYSIS DIPSTICK
BILIRUBIN UA: NEGATIVE
GLUCOSE UA: NEGATIVE
KETONES UA: NEGATIVE
Nitrite, UA: NEGATIVE
PROTEIN UA: NEGATIVE
SPEC GRAV UA: 1.02
Urobilinogen, UA: NEGATIVE
pH, UA: 6

## 2016-06-24 MED ORDER — NITROFURANTOIN MONOHYD MACRO 100 MG PO CAPS: 100.0000 mg | ORAL_CAPSULE | Freq: Two times a day (BID) | ORAL | 1 refills | Status: DC

## 2016-06-24 NOTE — Progress Notes (Signed)
Chief complaint: 1. Cystocele 2. Vaginal atrophy 3. Procidentia 4. Pessary maintenance (last visit 04/02/2016)  12 week pessary check History of vaginal abrasion in January 2017 Gellhorn pessary Premarin intravaginally once a week Trimosan gel intravaginally once a week  No vaginal discharge, vaginal bleeding, vaginal odor, or vaginal pain  Past medical history, past surgical history, problem list, medications, and allergies are reviewed  OBJECTIVE: BP (!) 152/63   Pulse 84   Ht 5' 4"  (1.626 m)   Wt 148 lb 3.2 oz (67.2 kg)   BMI 25.44 kg/m  CONSTITUTIONAL: Well-developed, well-nourished female in no acute distress.  PELVIC: External Genitalia: Normal BUS: Normal Vagina: Fair, estrogen effect. Posterior hyperemia approx 2cm, no ulceration. Minimal odor Cervix: Normal; 1 cm posterior cervical polyp at 6:00, nonfriable Uterus: Normal size, shape, consistency, mobile RV: Normal external exam  PROCEDURE: Pessary is removed, cleaned, and reinserted  ASSESSMENT: 1. Pessary maintenance-normal 2. Uterine procidentia 3. Cystocele 4. Vaginal atrophy 5. Nocturia  PLAN: 1. Continue with Premarin cream Weekly 2. Continue with TRIMO san gel weekly 3. Return 12 weeks for pessary maintenance 4. Macrobid twice a day for 7 days 5. Urine culture is sent  A total of 15 minutes were spent face-to-face with the patient during this encounter and over half of that time dealt with counseling and coordination of care.  Brayton Mars, MD  Note: This dictation was prepared with Dragon dictation along with smaller phrase technology. Any transcriptional errors that result from this process are unintentional.

## 2016-06-24 NOTE — Patient Instructions (Signed)
1. Continue Trimosan gel once a week intravaginal 2. Continue Premarin cream intravaginal once a week 3. Return in 3 months for follow-up

## 2016-06-26 LAB — URINE CULTURE

## 2016-06-28 ENCOUNTER — Ambulatory Visit
Admission: EM | Admit: 2016-06-28 | Discharge: 2016-06-28 | Disposition: A | Discharge status: Home / Self Care | Payer: Medicare Other | Attending: Family Medicine | Admitting: Family Medicine

## 2016-06-28 ENCOUNTER — Encounter: Payer: Self-pay | Admitting: Emergency Medicine

## 2016-06-28 DIAGNOSIS — N3001 Acute cystitis with hematuria: Secondary | ICD-10-CM | POA: Diagnosis not present

## 2016-06-28 DIAGNOSIS — K649 Unspecified hemorrhoids: Secondary | ICD-10-CM

## 2016-06-28 DIAGNOSIS — K625 Hemorrhage of anus and rectum: Secondary | ICD-10-CM | POA: Insufficient documentation

## 2016-06-28 DIAGNOSIS — Z87891 Personal history of nicotine dependence: Secondary | ICD-10-CM | POA: Insufficient documentation

## 2016-06-28 DIAGNOSIS — R319 Hematuria, unspecified: Secondary | ICD-10-CM | POA: Diagnosis present

## 2016-06-28 DIAGNOSIS — E079 Disorder of thyroid, unspecified: Secondary | ICD-10-CM | POA: Insufficient documentation

## 2016-06-28 DIAGNOSIS — I10 Essential (primary) hypertension: Secondary | ICD-10-CM | POA: Insufficient documentation

## 2016-06-28 DIAGNOSIS — J439 Emphysema, unspecified: Secondary | ICD-10-CM | POA: Diagnosis not present

## 2016-06-28 LAB — URINALYSIS COMPLETE WITH MICROSCOPIC (ARMC ONLY)
BILIRUBIN URINE: NEGATIVE
Glucose, UA: NEGATIVE mg/dL
KETONES UR: NEGATIVE mg/dL
Nitrite: NEGATIVE
PH: 5 (ref 5.0–8.0)
Protein, ur: NEGATIVE mg/dL
Specific Gravity, Urine: 1.01 (ref 1.005–1.030)

## 2016-06-28 MED ORDER — FLUCONAZOLE 150 MG PO TABS: 150.0000 mg | ORAL_TABLET | Freq: Every day | ORAL | 0 refills | Status: DC

## 2016-06-28 MED ORDER — NITROFURANTOIN MONOHYD MACRO 100 MG PO CAPS: 100.0000 mg | ORAL_CAPSULE | Freq: Two times a day (BID) | ORAL | 0 refills | Status: DC

## 2016-06-28 NOTE — ED Provider Notes (Signed)
[facility]    CSN: [id number] Arrival date & time: 06/28/16  1333     History   Chief Complaint Chief Complaint  Patient presents with  . Hematuria  . Rectal Bleeding    HPI Paige Hart is a 81 y.o. female.   81 yo female with a c/o intermittent mild blood in urine earlier in the week and now resolved, but still with slight lower abdominal discomfort. Denies any fevers, chills, diarrhea. States has been constipated and today noticed some blood streaks on the toilet paper.    The history is provided by the patient.  Hematuria   Rectal Bleeding    Past Medical History:  Diagnosis Date  . Cystocele   . Emphysema/COPD ([place])   . Hypertension   . Procidentia of uterus   . Thyroid disease   . Urge incontinence   . Vaginal atrophy     Patient Active Problem List   Diagnosis Date Noted  . Cystocele 12/27/2015  . Uterine procidentia 12/27/2015  . Pessary maintenance 03/22/2015  . Allergic conjunctivitis 11/17/2014  . Allergic rhinitis 11/17/2014  . Essential (primary) hypertension 11/17/2014  . Graves disease 11/17/2014  . Bursitis of hip 11/17/2014  . Combined fat and carbohydrate induced hyperlipemia 11/17/2014  . Chronic obstructive pulmonary emphysema ([place]) 11/17/2014  . Pelvic relaxation due to uterovaginal prolapse - pessary use 11/17/2014    Past Surgical History:  Procedure Laterality Date  . OOPHORECTOMY Left 1970    OB History    Gravida Para Term Preterm AB Living   4 4 4     4    SAB TAB Ectopic Multiple Live Births           4       Home Medications    Prior to Admission medications   Medication Sig Start Date End Date Taking? Authorizing Provider  albuterol (PROVENTIL HFA;VENTOLIN HFA) 108 (90 Base) MCG/ACT inhaler Inhale 2 puffs into the lungs every 4 (four) hours as needed for wheezing or shortness of breath. 03/27/16   Glean Hess, MD  budesonide-formoterol [facility]) 160-4.5 MCG/ACT inhaler Inhale 2 puffs into the  lungs 2 (two) times daily. 03/27/16   Glean Hess, MD  Cetirizine HCl 10 MG CAPS Take 1 tablet by mouth daily.    Historical Provider, MD  ESTRACE VAGINAL 0.1 MG/GM vaginal cream INSERT 0.5GRAMS VAGINALLY ONCE WEEKLY 08/07/15   Alanda Slim Defrancesco, MD  fluconazole (DIFLUCAN) 150 MG tablet Take 1 tablet (150 mg total) by mouth daily. 06/28/16   Norval Gable, MD  losartan (COZAAR) 100 MG tablet TAKE ONE TABLET BY MOUTH ONCE DAILY 01/02/16   Glean Hess, MD  MULTIPLE VITAMINS-MINERALS PO Take 1 tablet by mouth daily.    Historical Provider, MD  nitrofurantoin, macrocrystal-monohydrate, (MACROBID) 100 MG capsule Take 1 capsule (100 mg total) by mouth 2 (two) times daily. 06/28/16   Norval Gable, MD  OXYQUINOLONE SULFATE VAGINAL 0.025 % GEL Place 1 application vaginally once a week.    Historical Provider, MD    Family History Family History  Problem Relation Age of Onset  . Ovarian cancer Mother   . Mental illness Sister   . Diabetes Neg Hx   . Heart disease Neg Hx   . Breast cancer Neg Hx   . Colon cancer Neg Hx     Social History Social History  Substance Use Topics  . Smoking status: Former Research scientist (life sciences)  . Smokeless tobacco: Never Used  . Alcohol use No  Allergies   Patient has no known allergies.   Review of Systems Review of Systems  Gastrointestinal: Positive for hematochezia.  Genitourinary: Positive for hematuria.     Physical Exam Triage Vital Signs ED Triage Vitals  Enc Vitals Group     BP 06/28/16 1411 (S) (!) 187/85     Pulse Rate 06/28/16 1411 80     Resp 06/28/16 1411 16     Temp 06/28/16 1411 98.5 F (36.9 C)     Temp Source 06/28/16 1411 Oral     SpO2 06/28/16 1411 96 %     Weight 06/28/16 1412 147 lb (66.7 kg)     Height 06/28/16 1412 5' 4"  (1.626 m)     Head Circumference --      Peak Flow --      Pain Score 06/28/16 1414 0     Pain Loc --      Pain Edu? --      Excl. in [place]? --    No data found.   Updated Vital Signs BP (S) (!) 187/85  (BP Location: Left Arm)   Pulse 80   Temp 98.5 F (36.9 C) (Oral)   Resp 16   Ht 5' 4"  (1.626 m)   Wt 147 lb (66.7 kg)   SpO2 96%   BMI 25.23 kg/m   Visual Acuity Right Eye Distance:   Left Eye Distance:   Bilateral Distance:    Right Eye Near:   Left Eye Near:    Bilateral Near:     Physical Exam  Constitutional: She appears well-developed and well-nourished. No distress.  Abdominal: Soft. Bowel sounds are normal. She exhibits no distension and no mass. There is no tenderness. There is no rebound and no guarding.  Multiple external hemorrhoids noted; no active bleeding  Skin: She is not diaphoretic.  Nursing note and vitals reviewed.    UC Treatments / Results  Labs (all labs ordered are listed, but only abnormal results are displayed) Labs Reviewed  URINALYSIS COMPLETEWITH MICROSCOPIC ([place]) - Abnormal; Notable for the following:       Result Value   APPearance HAZY (*)    Hgb urine dipstick TRACE (*)    Leukocytes, UA MODERATE (*)    Bacteria, UA FEW (*)    Squamous Epithelial / LPF 6-30 (*)    All other components within normal limits  URINE CULTURE    EKG  EKG Interpretation None       Radiology No results found.  Procedures Procedures (including critical care time)  Medications Ordered in UC Medications - No data to display   Initial Impression / Assessment and Plan / UC Course  I have reviewed the triage vital signs and the nursing notes.  Pertinent labs & imaging results that were available during my care of the patient were reviewed by me and considered in my medical decision making (see chart for details).  Clinical Course       Final Clinical Impressions(s) / UC Diagnoses   Final diagnoses:  Acute cystitis with hematuria  Hemorrhoids, unspecified hemorrhoid type    New Prescriptions Discharge Medication List as of 06/28/2016  2:50 PM    START taking these medications   Details  fluconazole (DIFLUCAN) 150 MG tablet Take  1 tablet (150 mg total) by mouth daily., Starting Sat 06/28/2016, Normal    nitrofurantoin, macrocrystal-monohydrate, (MACROBID) 100 MG capsule Take 1 capsule (100 mg total) by mouth 2 (two) times daily., Starting Sat 06/28/2016, Normal  1. Lab results and diagnosis reviewed with patient 2. rx as per orders above; reviewed possible side effects, interactions, risks and benefits  3. Recommend supportive treatment with increased fluids 4. Follow-up prn if symptoms worsen or don't improve   Norval Gable, MD 06/28/16 1553

## 2016-06-28 NOTE — ED Triage Notes (Signed)
Patient states that she was seen by her GYN physician on Tuesday and states that she had blood in her urine but did not have a UTI.  Patient states that she noticed some blood coming from her rectum.  Patient reports some lower abdominal discomfort.  Patient reports last bowel movement was this morning and reports that it was a small amount and loose. Patient reports that she still has urinary frequency.

## 2016-06-30 LAB — URINE CULTURE

## 2016-07-01 ENCOUNTER — Telehealth: Payer: Self-pay

## 2016-07-01 NOTE — Telephone Encounter (Signed)
Courtesy call back completed today for patient's recent visit at [facility]. Patient did not answer, left message on machine to call back with any questions or concerns.   

## 2016-07-03 ENCOUNTER — Encounter: Payer: Self-pay | Admitting: Internal Medicine

## 2016-07-03 ENCOUNTER — Ambulatory Visit (INDEPENDENT_AMBULATORY_CARE_PROVIDER_SITE_OTHER): Payer: Medicare Other | Admitting: Internal Medicine

## 2016-07-03 VITALS — BP 122/82 | HR 84 | Resp 16 | Ht 64.0 in | Wt 147.0 lb

## 2016-07-03 DIAGNOSIS — E0500 Thyrotoxicosis with diffuse goiter without thyrotoxic crisis or storm: Secondary | ICD-10-CM

## 2016-07-03 DIAGNOSIS — I10 Essential (primary) hypertension: Secondary | ICD-10-CM

## 2016-07-03 DIAGNOSIS — K649 Unspecified hemorrhoids: Secondary | ICD-10-CM | POA: Diagnosis not present

## 2016-07-03 MED ORDER — LOSARTAN POTASSIUM 100 MG PO TABS: 100.0000 mg | ORAL_TABLET | Freq: Every day | ORAL | 3 refills | Status: DC

## 2016-07-03 NOTE — Progress Notes (Signed)
Date:  07/03/2016   Name:  Paige Hart   DOB:  24-Mar-1935   MRN:  [id number]   Chief Complaint: Stool Color Change (ER dx with UTI and Hemorrhoids  ) Seen in ER three days ago for UTI and external hemorrhoids.  She had no active bleeding at that time.  They gave her antibiotics for UTI and told her to take laxatives and drink prune juice. UTI - cx showed multiple species.  She is on macrobid. She has been having loose frequent stools and sometimes only passes pink tinged mucus.  She denies rectal pain or itching.  Review of Systems  Constitutional: Negative for chills, fatigue and fever.  Respiratory: Negative for chest tightness and shortness of breath.   Cardiovascular: Negative for chest pain, palpitations and leg swelling.  Gastrointestinal: Positive for diarrhea (loose stools). Negative for abdominal pain, blood in stool and rectal pain.  Genitourinary: Negative for dysuria and hematuria.    Patient Active Problem List   Diagnosis Date Noted  . Cystocele 12/27/2015  . Uterine procidentia 12/27/2015  . Pessary maintenance 03/22/2015  . Allergic conjunctivitis 11/17/2014  . Allergic rhinitis 11/17/2014  . Essential (primary) hypertension 11/17/2014  . Graves disease 11/17/2014  . Bursitis of hip 11/17/2014  . Combined fat and carbohydrate induced hyperlipemia 11/17/2014  . Chronic obstructive pulmonary emphysema ([place]) 11/17/2014  . Pelvic relaxation due to uterovaginal prolapse - pessary use 11/17/2014    Prior to Admission medications   Medication Sig Start Date End Date Taking? Authorizing Provider  albuterol (PROVENTIL HFA;VENTOLIN HFA) 108 (90 Base) MCG/ACT inhaler Inhale 2 puffs into the lungs every 4 (four) hours as needed for wheezing or shortness of breath. 03/27/16  Yes Glean Hess, MD  budesonide-formoterol [facility]) 160-4.5 MCG/ACT inhaler Inhale 2 puffs into the lungs 2 (two) times daily. 03/27/16  Yes Glean Hess, MD  Cetirizine HCl 10 MG CAPS Take  1 tablet by mouth daily.   Yes Historical Provider, MD  ESTRACE VAGINAL 0.1 MG/GM vaginal cream INSERT 0.5GRAMS VAGINALLY ONCE WEEKLY 08/07/15  Yes Alanda Slim Defrancesco, MD  losartan (COZAAR) 100 MG tablet TAKE ONE TABLET BY MOUTH ONCE DAILY 01/02/16  Yes Glean Hess, MD  MULTIPLE VITAMINS-MINERALS PO Take 1 tablet by mouth daily.   Yes Historical Provider, MD  nitrofurantoin, macrocrystal-monohydrate, (MACROBID) 100 MG capsule Take 1 capsule (100 mg total) by mouth 2 (two) times daily. 06/28/16  Yes Norval Gable, MD  OXYQUINOLONE SULFATE VAGINAL 0.025 % GEL Place 1 application vaginally once a week.   Yes Historical Provider, MD    No Known Allergies  Past Surgical History:  Procedure Laterality Date  . OOPHORECTOMY Left 1970    Social History  Substance Use Topics  . Smoking status: Former Research scientist (life sciences)  . Smokeless tobacco: Never Used  . Alcohol use No     Medication list has been reviewed and updated.   Physical Exam  Constitutional: She is oriented to person, place, and time. She appears well-developed. No distress.  HENT:  Head: Normocephalic and atraumatic.  Cardiovascular: Normal rate, regular rhythm and normal heart sounds.   Pulmonary/Chest: Effort normal and breath sounds normal. No respiratory distress.  Abdominal: Soft. Bowel sounds are normal. She exhibits no distension. There is no tenderness.  Musculoskeletal: Normal range of motion.  Neurological: She is alert and oriented to person, place, and time.  Skin: Skin is warm and dry. No rash noted.  Psychiatric: She has a normal mood and affect. Her behavior is normal. Thought  content normal.  Nursing note and vitals reviewed.   BP 122/82   Pulse 84   Resp 16   Ht 5' 4"  (1.626 m)   Wt 147 lb (66.7 kg)   SpO2 95%   BMI 25.23 kg/m   Assessment and Plan: 1. Bleeding hemorrhoid Discontinue laxatives If bleeding continues, consider GI for anoscope vs empiric treatment with Anusol, etc  2. Essential (primary)  hypertension controlled - losartan (COZAAR) 100 MG tablet; Take 1 tablet (100 mg total) by mouth daily.  Dispense: 90 tablet; Refill: 3  3. Graves disease Return in 6 months for labs   Halina Maidens, MD [facility]  07/03/2016

## 2016-07-07 ENCOUNTER — Emergency Department
Admission: EM | Admit: 2016-07-07 | Discharge: 2016-07-07 | Disposition: A | Discharge status: Home / Self Care | Payer: Medicare Other | Attending: Emergency Medicine | Admitting: Emergency Medicine

## 2016-07-07 ENCOUNTER — Encounter: Payer: Self-pay | Admitting: Emergency Medicine

## 2016-07-07 DIAGNOSIS — Z79899 Other long term (current) drug therapy: Secondary | ICD-10-CM | POA: Diagnosis not present

## 2016-07-07 DIAGNOSIS — I10 Essential (primary) hypertension: Secondary | ICD-10-CM | POA: Insufficient documentation

## 2016-07-07 DIAGNOSIS — R319 Hematuria, unspecified: Secondary | ICD-10-CM

## 2016-07-07 DIAGNOSIS — N390 Urinary tract infection, site not specified: Secondary | ICD-10-CM | POA: Insufficient documentation

## 2016-07-07 DIAGNOSIS — K5900 Constipation, unspecified: Secondary | ICD-10-CM | POA: Insufficient documentation

## 2016-07-07 DIAGNOSIS — J449 Chronic obstructive pulmonary disease, unspecified: Secondary | ICD-10-CM | POA: Insufficient documentation

## 2016-07-07 DIAGNOSIS — Z87891 Personal history of nicotine dependence: Secondary | ICD-10-CM | POA: Diagnosis not present

## 2016-07-07 LAB — URINALYSIS, COMPLETE (UACMP) WITH MICROSCOPIC
Bilirubin Urine: NEGATIVE
GLUCOSE, UA: NEGATIVE mg/dL
KETONES UR: 5 mg/dL — AB
Nitrite: NEGATIVE
PROTEIN: NEGATIVE mg/dL
Specific Gravity, Urine: 1.021 (ref 1.005–1.030)
pH: 5 (ref 5.0–8.0)

## 2016-07-07 LAB — CBC
HEMATOCRIT: 43.6 % (ref 35.0–47.0)
HEMOGLOBIN: 14.3 g/dL (ref 12.0–16.0)
MCH: 28.8 pg (ref 26.0–34.0)
MCHC: 32.8 g/dL (ref 32.0–36.0)
MCV: 87.7 fL (ref 80.0–100.0)
Platelets: 258 10*3/uL (ref 150–440)
RBC: 4.98 MIL/uL (ref 3.80–5.20)
RDW: 14.2 % (ref 11.5–14.5)
WBC: 15.3 10*3/uL — ABNORMAL HIGH (ref 3.6–11.0)

## 2016-07-07 LAB — COMPREHENSIVE METABOLIC PANEL
ALT: 16 U/L (ref 14–54)
AST: 17 U/L (ref 15–41)
Albumin: 4.2 g/dL (ref 3.5–5.0)
Alkaline Phosphatase: 67 U/L (ref 38–126)
Anion gap: 8 (ref 5–15)
BILIRUBIN TOTAL: 0.9 mg/dL (ref 0.3–1.2)
BUN: 25 mg/dL — AB (ref 6–20)
CHLORIDE: 104 mmol/L (ref 101–111)
CO2: 25 mmol/L (ref 22–32)
CREATININE: 1.06 mg/dL — AB (ref 0.44–1.00)
Calcium: 9.2 mg/dL (ref 8.9–10.3)
GFR, EST AFRICAN AMERICAN: 56 mL/min — AB (ref >60)
GFR, EST NON AFRICAN AMERICAN: 49 mL/min — AB (ref >60)
Glucose, Bld: 105 mg/dL — ABNORMAL HIGH (ref 65–99)
Potassium: 4.2 mmol/L (ref 3.5–5.1)
Sodium: 137 mmol/L (ref 135–145)
TOTAL PROTEIN: 7.8 g/dL (ref 6.5–8.1)

## 2016-07-07 LAB — LIPASE, BLOOD: LIPASE: 21 U/L (ref 11–51)

## 2016-07-07 MED ORDER — DOCUSATE SODIUM 100 MG PO CAPS: 100.0000 mg | ORAL_CAPSULE | ORAL | 2 refills | Status: AC

## 2016-07-07 MED ORDER — CEPHALEXIN 500 MG PO CAPS: 500.0000 mg | ORAL_CAPSULE | Freq: Two times a day (BID) | ORAL | 0 refills | Status: DC

## 2016-07-07 NOTE — ED Notes (Signed)
Pt resting in chair in lobby, reading; waiting for treatment room;

## 2016-07-07 NOTE — Discharge Instructions (Signed)
Please follow-up with your primary care doctor in the next 1-2 days for recheck since reevaluation. Return to the emergency department for any abdominal pain, fever, or any other symptom personally concerning to yourself.

## 2016-07-07 NOTE — ED Triage Notes (Signed)
Pt ambulatory to triage via POV by herself, c/o of lower abdominal pain and constipation.  Over the last 2 weeks pt with multiple visits with complaints of loose watery stools "red and slimy" to constipation, blood in the urine and false positive UTI (3 x days of Macrobid), also hemorrhoids.

## 2016-07-07 NOTE — ED Provider Notes (Signed)
[facility] Emergency Department Provider Note  Time seen: 6:20 AM  I have reviewed the triage vital signs and the nursing notes.   HISTORY  Chief Complaint Abdominal Pain    HPI Paige Hart is a 81 y.o. female who presents to the emergency department for lower abdominal pain and constipation. According to the patient for the past 1-2 weeks she has noticed intermittent blood in her urine. She was started on antibiotic approximately 2 weeks ago for possible urinary tract infection, but after the culture grew out negative this was discontinued. Patient states continued intermittent blood in her urine. She is also concerned about her intermittent constipation. States she normally has a bowel movement every day. For the past week or so she has been feeling constipated. States she had not had a bowel movement for approximately 2 days until this morning where she took prune juice and had a large bowel movement. Denies fever. States the abdominal pain resolved after the bowel movement. Denies any current abdominal pain. Denies nausea or vomiting. Denies dysuria.  Past Medical History:  Diagnosis Date  . Cystocele   . Emphysema/COPD ([place])   . Hypertension   . Procidentia of uterus   . Thyroid disease   . Urge incontinence   . Vaginal atrophy     Patient Active Problem List   Diagnosis Date Noted  . Bleeding hemorrhoid 07/03/2016  . Cystocele 12/27/2015  . Uterine procidentia 12/27/2015  . Pessary maintenance 03/22/2015  . Allergic conjunctivitis 11/17/2014  . Allergic rhinitis 11/17/2014  . Essential (primary) hypertension 11/17/2014  . Graves disease 11/17/2014  . Bursitis of hip 11/17/2014  . Combined fat and carbohydrate induced hyperlipemia 11/17/2014  . Chronic obstructive pulmonary emphysema ([place]) 11/17/2014  . Pelvic relaxation due to uterovaginal prolapse - pessary use 11/17/2014    Past Surgical History:  Procedure Laterality Date  .  OOPHORECTOMY Left 1970    Prior to Admission medications   Medication Sig Start Date End Date Taking? Authorizing Provider  albuterol (PROVENTIL HFA;VENTOLIN HFA) 108 (90 Base) MCG/ACT inhaler Inhale 2 puffs into the lungs every 4 (four) hours as needed for wheezing or shortness of breath. 03/27/16   Glean Hess, MD  budesonide-formoterol [facility]) 160-4.5 MCG/ACT inhaler Inhale 2 puffs into the lungs 2 (two) times daily. 03/27/16   Glean Hess, MD  Cetirizine HCl 10 MG CAPS Take 1 tablet by mouth daily.    Historical Provider, MD  ESTRACE VAGINAL 0.1 MG/GM vaginal cream INSERT 0.5GRAMS VAGINALLY ONCE WEEKLY 08/07/15   Alanda Slim Defrancesco, MD  losartan (COZAAR) 100 MG tablet Take 1 tablet (100 mg total) by mouth daily. 07/03/16   Glean Hess, MD  MULTIPLE VITAMINS-MINERALS PO Take 1 tablet by mouth daily.    Historical Provider, MD  nitrofurantoin, macrocrystal-monohydrate, (MACROBID) 100 MG capsule Take 1 capsule (100 mg total) by mouth 2 (two) times daily. 06/28/16   Norval Gable, MD  OXYQUINOLONE SULFATE VAGINAL 0.025 % GEL Place 1 application vaginally once a week.    Historical Provider, MD    No Known Allergies  Family History  Problem Relation Age of Onset  . Ovarian cancer Mother   . Mental illness Sister   . Diabetes Neg Hx   . Heart disease Neg Hx   . Breast cancer Neg Hx   . Colon cancer Neg Hx     Social History Social History  Substance Use Topics  . Smoking status: Former Research scientist (life sciences)  . Smokeless tobacco: Never Used  .  Alcohol use No    Review of Systems Constitutional: Negative for fever. Cardiovascular: Negative for chest pain. Respiratory: Negative for shortness of breath. Gastrointestinal: Intermittent lower abdominal pain. Negative for nausea or vomiting. Positive for constipation. Neurological: Negative for headache 10-point ROS otherwise negative.  ____________________________________________   PHYSICAL EXAM:  VITAL SIGNS: ED Triage  Vitals  Enc Vitals Group     BP 07/07/16 0224 (!) 162/77     Pulse Rate 07/07/16 0224 93     Resp 07/07/16 0224 20     Temp 07/07/16 0224 97.6 F (36.4 C)     Temp Source 07/07/16 0224 Oral     SpO2 07/07/16 0224 92 %     Weight 07/07/16 0225 147 lb (66.7 kg)     Height 07/07/16 0225 5' 4"  (1.626 m)     Head Circumference --      Peak Flow --      Pain Score 07/07/16 0247 6     Pain Loc --      Pain Edu? --      Excl. in [place]? --     Constitutional: Alert and oriented. Well appearing and in no distress. Eyes: Normal exam ENT   Head: Normocephalic and atraumatic.   Mouth/Throat: Mucous membranes are moist. Cardiovascular: Normal rate, regular rhythm. No murmur Respiratory: Normal respiratory effort without tachypnea nor retractions. Breath sounds are clear Gastrointestinal: Soft and nontender. No distention.   Musculoskeletal: Nontender with normal range of motion in all extremities Neurologic:  Normal speech and language. No gross focal neurologic deficits Skin:  Skin is warm, dry and intact.  Psychiatric: Mood and affect are normal.   ____________________________________________   INITIAL IMPRESSION / ASSESSMENT AND PLAN / ED COURSE  Pertinent labs & imaging results that were available during my care of the patient were reviewed by me and considered in my medical decision making (see chart for details).  Patient's labs show too numerous to count white blood cells large amount of leukocytes, findings most suggestive of urinary tract infection. I will add on a urine culture. We will start the patient on antibiotics. Patient also describes intermittent constipation, states she was able to have a large bowel movement this morning after drinking print juice and taking a probiotic. I discussed with the patient increasing fiber and starting her on Colace 1 tablet every other day, and attempt to keep her from becoming constipated. Patient will follow up with her primary care  doctor. I discussed return precautions for any worsening abdominal pain or fever.  ____________________________________________   FINAL CLINICAL IMPRESSION(S) / ED DIAGNOSES  Constipation Urinary tract infection    Harvest Dark, MD 07/07/16 [phone]

## 2016-07-09 ENCOUNTER — Ambulatory Visit (INDEPENDENT_AMBULATORY_CARE_PROVIDER_SITE_OTHER): Payer: Medicare Other | Admitting: Internal Medicine

## 2016-07-09 ENCOUNTER — Encounter: Payer: Self-pay | Admitting: Internal Medicine

## 2016-07-09 VITALS — BP 126/84 | HR 94 | Temp 97.4°F | Ht 64.0 in | Wt 144.0 lb

## 2016-07-09 DIAGNOSIS — N3001 Acute cystitis with hematuria: Secondary | ICD-10-CM | POA: Diagnosis not present

## 2016-07-09 DIAGNOSIS — K5904 Chronic idiopathic constipation: Secondary | ICD-10-CM | POA: Diagnosis not present

## 2016-07-09 LAB — POCT URINALYSIS DIPSTICK
Bilirubin, UA: NEGATIVE
Glucose, UA: NEGATIVE
KETONES UA: NEGATIVE
Nitrite, UA: NEGATIVE
PROTEIN UA: NEGATIVE
RBC UA: NEGATIVE
SPEC GRAV UA: 1.015
Urobilinogen, UA: 0.2
pH, UA: 5

## 2016-07-09 LAB — URINE CULTURE: Culture: >=100000 — AB

## 2016-07-09 NOTE — Progress Notes (Signed)
Date:  07/09/2016   Name:  Paige Hart   DOB:  [birth year]   MRN:  [id number]   Chief Complaint: Urinary Tract Infection Urinary Tract Infection   This is a new problem. The current episode started in the past 7 days. The problem has been gradually improving. The quality of the pain is described as burning. The patient is experiencing no pain. There has been no fever. Pertinent negatives include no chills, hematuria or nausea. She has tried antibiotics for the symptoms. The treatment provided significant relief.  Constipation  This is a new problem. The current episode started 1 to 4 weeks ago. The problem has been gradually improving since onset. Her stool frequency is 2 to 3 times per week. The patient is not on a high fiber diet. She does not exercise regularly. There has been adequate water intake. Pertinent negatives include no fever or nausea. She has tried stool softeners for the symptoms. The treatment provided significant relief.    Review of Systems  Constitutional: Negative for chills, diaphoresis, fatigue and fever.  Respiratory: Negative for cough, chest tightness, shortness of breath and wheezing.   Cardiovascular: Negative for chest pain, palpitations and leg swelling.  Gastrointestinal: Positive for constipation. Negative for nausea.  Genitourinary: Negative for dysuria and hematuria.  Musculoskeletal: Negative for arthralgias.    Patient Active Problem List   Diagnosis Date Noted  . Bleeding hemorrhoid 07/03/2016  . Cystocele 12/27/2015  . Uterine procidentia 12/27/2015  . Pessary maintenance 03/22/2015  . Allergic conjunctivitis 11/17/2014  . Allergic rhinitis 11/17/2014  . Essential (primary) hypertension 11/17/2014  . Graves disease 11/17/2014  . Bursitis of hip 11/17/2014  . Combined fat and carbohydrate induced hyperlipemia 11/17/2014  . Chronic obstructive pulmonary emphysema ([place]) 11/17/2014  . Pelvic relaxation due to uterovaginal prolapse - pessary use  11/17/2014    Prior to Admission medications   Medication Sig Start Date End Date Taking? Authorizing Provider  albuterol (PROVENTIL HFA;VENTOLIN HFA) 108 (90 Base) MCG/ACT inhaler Inhale 2 puffs into the lungs every 4 (four) hours as needed for wheezing or shortness of breath. 03/27/16  Yes Glean Hess, MD  budesonide-formoterol [facility]) 160-4.5 MCG/ACT inhaler Inhale 2 puffs into the lungs 2 (two) times daily. 03/27/16  Yes Glean Hess, MD  cephALEXin (KEFLEX) 500 MG capsule Take 1 capsule (500 mg total) by mouth 2 (two) times daily. 07/07/16  Yes Harvest Dark, MD  docusate sodium (COLACE) 100 MG capsule Take 1 capsule (100 mg total) by mouth every other day. 07/07/16 07/07/17 Yes Harvest Dark, MD  losartan (COZAAR) 100 MG tablet Take 1 tablet (100 mg total) by mouth daily. 07/03/16  Yes Glean Hess, MD  MULTIPLE VITAMINS-MINERALS PO Take 1 tablet by mouth daily.   Yes Historical Provider, MD  Cetirizine HCl 10 MG CAPS Take 1 tablet by mouth daily.    Historical Provider, MD  ESTRACE VAGINAL 0.1 MG/GM vaginal cream INSERT 0.5GRAMS VAGINALLY ONCE WEEKLY Patient not taking: Reported on 07/09/2016 08/07/15   Alanda Slim Defrancesco, MD  nitrofurantoin, macrocrystal-monohydrate, (MACROBID) 100 MG capsule Take 1 capsule (100 mg total) by mouth 2 (two) times daily. Patient not taking: Reported on 07/09/2016 06/28/16   Norval Gable, MD  OXYQUINOLONE SULFATE VAGINAL 0.025 % GEL Place 1 application vaginally once a week.    Historical Provider, MD    No Known Allergies  Past Surgical History:  Procedure Laterality Date  . OOPHORECTOMY Left 1970    Social History  Substance Use Topics  .  Smoking status: Former Research scientist (life sciences)  . Smokeless tobacco: Never Used  . Alcohol use No     Medication list has been reviewed and updated.   Physical Exam  Constitutional: She is oriented to person, place, and time. She appears well-developed. No distress.  HENT:  Head: Normocephalic and  atraumatic.  Neck: Normal range of motion. Neck supple.  Cardiovascular: Normal rate, regular rhythm and normal heart sounds.   Pulmonary/Chest: Effort normal and breath sounds normal. No respiratory distress.  Abdominal: Soft. Bowel sounds are normal. She exhibits no distension. There is no tenderness.  Musculoskeletal: Normal range of motion.  Neurological: She is alert and oriented to person, place, and time.  Skin: Skin is warm and dry. No rash noted.  Psychiatric: She has a normal mood and affect. Her behavior is normal. Thought content normal.  Nursing note and vitals reviewed.   BP 126/84   Pulse 94   Temp 97.4 F (36.3 C)   Ht 5' 4"  (1.626 m)   Wt 144 lb (65.3 kg)   SpO2 95%   BMI 24.72 kg/m   Assessment and Plan: 1. Chronic idiopathic constipation Continue stool softeners and fluids  2. Acute cystitis with hematuria Finish all antibiotics UA improved   Halina Maidens, MD [facility]  07/09/2016

## 2016-07-10 NOTE — Progress Notes (Signed)
ED Antimicrobial Stewardship Positive Culture Follow Up   Paige Hart is an 81 y.o. female who presented to [facility] on 07/07/2016 with a chief complaint of  Chief Complaint  Patient presents with  . Abdominal Pain    Recent Results (from the past 720 hour(s))  Urine culture     Status: None   Collection Time: 06/24/16 11:36 AM  Result Value Ref Range Status   Urine Culture, Routine Final report  Final   Urine Culture result 1 Comment  Final    Comment: Mixed urogenital flora Less than 10,000 colonies/mL   Urine culture     Status: Abnormal   Collection Time: 06/28/16  2:11 PM  Result Value Ref Range Status   Specimen Description URINE, CLEAN CATCH  Final   Special Requests NONE  Final   Culture MULTIPLE SPECIES PRESENT, SUGGEST RECOLLECTION (A)  Final   Report Status 06/30/2016 FINAL  Final  Urine culture     Status: Abnormal   Collection Time: 07/07/16  2:51 AM  Result Value Ref Range Status   Specimen Description URINE, RANDOM  Final   Special Requests NONE  Final   Culture >=100,000 COLONIES/mL PROTEUS MIRABILIS (A)  Final   Report Status 07/09/2016 FINAL  Final   Organism ID, Bacteria PROTEUS MIRABILIS (A)  Final      Susceptibility   Proteus mirabilis - MIC*    AMPICILLIN <=2 SENSITIVE Sensitive     CEFAZOLIN <=4 SENSITIVE Sensitive     CEFTRIAXONE <=1 SENSITIVE Sensitive     CIPROFLOXACIN <=0.25 SENSITIVE Sensitive     GENTAMICIN <=1 SENSITIVE Sensitive     IMIPENEM 8 INTERMEDIATE Intermediate     NITROFURANTOIN 128 RESISTANT Resistant     TRIMETH/SULFA <=20 SENSITIVE Sensitive     AMPICILLIN/SULBACTAM <=2 SENSITIVE Sensitive     PIP/TAZO <=4 SENSITIVE Sensitive     * >=100,000 COLONIES/mL PROTEUS MIRABILIS   Treated with cephalexin 500 mg bid x 10 days, organism susceptible to prescribed antimicrobial per PCP note, treatment has provided relief for patient.  Darrow Bussing, PharmD Pharmacy Resident 07/10/2016 12:14 PM

## 2016-07-11 ENCOUNTER — Telehealth: Payer: Self-pay | Admitting: Internal Medicine

## 2016-07-11 NOTE — Telephone Encounter (Signed)
Called to let her know need to make an appointment to see Dr. Army Melia for rectal exam----if cannot make appointment today, and worse over the weekend go to ER.Paige Hart

## 2016-07-11 NOTE — Telephone Encounter (Signed)
Pt called stated saw Dr. Army Melia 07-09-16 still having rectal bleeding and having irregular--pt taking antibiotic prescribe by ER and still taking it and couple days left.

## 2016-07-11 NOTE — Telephone Encounter (Signed)
She will need to have a rectal exam - it appears it was not done by the ED.  Please schedule her for follow up.

## 2016-07-14 ENCOUNTER — Encounter: Payer: Self-pay | Admitting: Internal Medicine

## 2016-07-14 ENCOUNTER — Ambulatory Visit (INDEPENDENT_AMBULATORY_CARE_PROVIDER_SITE_OTHER): Payer: Medicare Other | Admitting: Internal Medicine

## 2016-07-14 VITALS — BP 126/88 | HR 88 | Temp 97.0°F | Ht 64.0 in | Wt 145.0 lb

## 2016-07-14 DIAGNOSIS — K649 Unspecified hemorrhoids: Secondary | ICD-10-CM | POA: Diagnosis not present

## 2016-07-14 DIAGNOSIS — K5904 Chronic idiopathic constipation: Secondary | ICD-10-CM

## 2016-07-14 MED ORDER — HYDROCORTISONE 2.5 % RE CREA: 1.0000 "application " | TOPICAL_CREAM | Freq: Two times a day (BID) | RECTAL | 2 refills | Status: DC

## 2016-07-14 NOTE — Progress Notes (Signed)
Date:  07/14/2016   Name:  Paige Hart   DOB:  January 19, 1935   MRN:  [id number]   Chief Complaint: Rectal Bleeding Rectal Bleeding   The current episode started more than 2 weeks ago. The problem occurs occasionally. The stool is described as streaked with blood. Pertinent negatives include no fever, no abdominal pain, no rectal pain, no vomiting and no chest pain.  She had a regular BM last night - soft and easy to pass.  There was no blood.   This am she was straining and had a BM with no blood but then had bleeding about one hour ago.  No bleeding occurs other than with stool.      Review of Systems  Constitutional: Negative for chills and fever.  Respiratory: Negative for chest tightness and shortness of breath.   Cardiovascular: Negative for chest pain.  Gastrointestinal: Positive for hematochezia. Negative for abdominal distention, abdominal pain, rectal pain and vomiting.  Neurological: Negative for dizziness and weakness.    Patient Active Problem List   Diagnosis Date Noted  . Chronic idiopathic constipation 07/09/2016  . Acute cystitis with hematuria 07/09/2016  . Bleeding hemorrhoid 07/03/2016  . Cystocele 12/27/2015  . Uterine procidentia 12/27/2015  . Pessary maintenance 03/22/2015  . Allergic conjunctivitis 11/17/2014  . Allergic rhinitis 11/17/2014  . Essential (primary) hypertension 11/17/2014  . Graves disease 11/17/2014  . Bursitis of hip 11/17/2014  . Combined fat and carbohydrate induced hyperlipemia 11/17/2014  . Chronic obstructive pulmonary emphysema ([place]) 11/17/2014  . Pelvic relaxation due to uterovaginal prolapse - pessary use 11/17/2014    Prior to Admission medications   Medication Sig Start Date End Date Taking? Authorizing Provider  albuterol (PROVENTIL HFA;VENTOLIN HFA) 108 (90 Base) MCG/ACT inhaler Inhale 2 puffs into the lungs every 4 (four) hours as needed for wheezing or shortness of breath. 03/27/16  Yes Glean Hess, MD    budesonide-formoterol [facility]) 160-4.5 MCG/ACT inhaler Inhale 2 puffs into the lungs 2 (two) times daily. 03/27/16  Yes Glean Hess, MD  cephALEXin (KEFLEX) 500 MG capsule Take 1 capsule (500 mg total) by mouth 2 (two) times daily. 07/07/16  Yes Harvest Dark, MD  Cetirizine HCl 10 MG CAPS Take 1 tablet by mouth daily.   Yes Historical Provider, MD  docusate sodium (COLACE) 100 MG capsule Take 1 capsule (100 mg total) by mouth every other day. 07/07/16 07/07/17 Yes Harvest Dark, MD  ESTRACE VAGINAL 0.1 MG/GM vaginal cream INSERT 0.5GRAMS VAGINALLY ONCE WEEKLY 08/07/15  Yes Alanda Slim Defrancesco, MD  losartan (COZAAR) 100 MG tablet Take 1 tablet (100 mg total) by mouth daily. 07/03/16  Yes Glean Hess, MD  MULTIPLE VITAMINS-MINERALS PO Take 1 tablet by mouth daily.   Yes Historical Provider, MD  OXYQUINOLONE SULFATE VAGINAL 0.025 % GEL Place 1 application vaginally once a week.   Yes Historical Provider, MD    No Known Allergies  Past Surgical History:  Procedure Laterality Date  . OOPHORECTOMY Left 1970    Social History  Substance Use Topics  . Smoking status: Former Research scientist (life sciences)  . Smokeless tobacco: Never Used  . Alcohol use No     Medication list has been reviewed and updated.   Physical Exam  Constitutional: She is oriented to person, place, and time. She appears well-developed. No distress.  HENT:  Head: Normocephalic and atraumatic.  Cardiovascular: Normal rate, regular rhythm and normal heart sounds.   Pulmonary/Chest: Effort normal and breath sounds normal. No respiratory distress.  Abdominal: Soft.  Bowel sounds are normal. She exhibits no distension. There is no tenderness. There is no rebound.  Genitourinary: Rectal exam shows external hemorrhoid and internal hemorrhoid. Rectal exam shows no mass, no tenderness and anal tone normal.  Musculoskeletal: Normal range of motion.  Neurological: She is alert and oriented to person, place, and time.  Skin: Skin  is warm and dry. No rash noted.  Psychiatric: She has a normal mood and affect. Her behavior is normal. Thought content normal.  Nursing note and vitals reviewed.   BP 126/88   Pulse 88   Temp 97 F (36.1 C)   Ht 5' 4"  (1.626 m)   Wt 145 lb (65.8 kg)   SpO2 98%   BMI 24.89 kg/m   Assessment and Plan: 1. Bleeding hemorrhoid - hydrocortisone (ANUSOL-HC) 2.5 % rectal cream; Place 1 application rectally 2 (two) times daily.  Dispense: 30 g; Refill: 2  2. Chronic idiopathic constipation Continue Colace daily plus high fiber diet with sufficient fluids   Halina Maidens, MD [facility]  07/14/2016

## 2016-08-01 ENCOUNTER — Encounter: Payer: Self-pay | Admitting: Internal Medicine

## 2016-08-01 ENCOUNTER — Ambulatory Visit (INDEPENDENT_AMBULATORY_CARE_PROVIDER_SITE_OTHER): Payer: Medicare Other | Admitting: Internal Medicine

## 2016-08-01 VITALS — BP 140/82 | HR 87 | Temp 99.3°F | Resp 16 | Ht 64.0 in | Wt 142.0 lb

## 2016-08-01 DIAGNOSIS — K649 Unspecified hemorrhoids: Secondary | ICD-10-CM | POA: Diagnosis not present

## 2016-08-01 NOTE — Progress Notes (Signed)
Date:  08/01/2016   Name:  Paige Hart   DOB:  Apr 16, 1935   MRN:  [id number]   Chief Complaint: Hemorrhoids (outside and internal and having large bloody stools) Rectal Bleeding   The current episode started more than 1 week ago. The problem has been unchanged. The pain is mild. The stool is described as streaked with blood and hard. Prior unsuccessful therapies include stool softeners (and anusol HC). Associated symptoms include hemorrhoids. Pertinent negatives include no fever, no abdominal pain, no diarrhea and no hematemesis.      Review of Systems  Constitutional: Negative for fever.  Respiratory: Negative for chest tightness and shortness of breath.   Gastrointestinal: Positive for blood in stool, constipation, hematochezia and hemorrhoids. Negative for abdominal pain, diarrhea and hematemesis.  Neurological: Negative for dizziness and light-headedness.    Patient Active Problem List   Diagnosis Date Noted  . Chronic idiopathic constipation 07/09/2016  . Acute cystitis with hematuria 07/09/2016  . Bleeding hemorrhoid 07/03/2016  . Cystocele 12/27/2015  . Uterine procidentia 12/27/2015  . Pessary maintenance 03/22/2015  . Allergic conjunctivitis 11/17/2014  . Allergic rhinitis 11/17/2014  . Essential (primary) hypertension 11/17/2014  . Graves disease 11/17/2014  . Bursitis of hip 11/17/2014  . Combined fat and carbohydrate induced hyperlipemia 11/17/2014  . Chronic obstructive pulmonary emphysema ([place]) 11/17/2014  . Pelvic relaxation due to uterovaginal prolapse - pessary use 11/17/2014    Prior to Admission medications   Medication Sig Start Date End Date Taking? Authorizing Provider  albuterol (PROVENTIL HFA;VENTOLIN HFA) 108 (90 Base) MCG/ACT inhaler Inhale 2 puffs into the lungs every 4 (four) hours as needed for wheezing or shortness of breath. 03/27/16  Yes Glean Hess, MD  budesonide-formoterol [facility]) 160-4.5 MCG/ACT inhaler Inhale 2 puffs into the  lungs 2 (two) times daily. 03/27/16  Yes Glean Hess, MD  Cetirizine HCl 10 MG CAPS Take 1 tablet by mouth daily.   Yes Historical Provider, MD  CRANBERRY EXTRACT PO Take by mouth.   Yes Historical Provider, MD  docusate sodium (COLACE) 100 MG capsule Take 1 capsule (100 mg total) by mouth every other day. 07/07/16 07/07/17 Yes Harvest Dark, MD  ESTRACE VAGINAL 0.1 MG/GM vaginal cream INSERT 0.5GRAMS VAGINALLY ONCE WEEKLY 08/07/15  Yes Alanda Slim Defrancesco, MD  hydrocortisone (ANUSOL-HC) 2.5 % rectal cream Place 1 application rectally 2 (two) times daily. 07/14/16  Yes Glean Hess, MD  losartan (COZAAR) 100 MG tablet Take 1 tablet (100 mg total) by mouth daily. 07/03/16  Yes Glean Hess, MD  MULTIPLE VITAMINS-MINERALS PO Take 1 tablet by mouth daily.   Yes Historical Provider, MD  OXYQUINOLONE SULFATE VAGINAL 0.025 % GEL Place 1 application vaginally once a week.   Yes Historical Provider, MD    No Known Allergies  Past Surgical History:  Procedure Laterality Date  . OOPHORECTOMY Left 1970    Social History  Substance Use Topics  . Smoking status: Former Research scientist (life sciences)  . Smokeless tobacco: Never Used  . Alcohol use No     Medication list has been reviewed and updated.   Physical Exam  Constitutional: She is oriented to person, place, and time. She appears well-developed. No distress.  HENT:  Head: Normocephalic and atraumatic.  Cardiovascular: Normal rate, regular rhythm and normal heart sounds.   Pulmonary/Chest: Effort normal and breath sounds normal. No respiratory distress.  Abdominal: Soft. Bowel sounds are normal. She exhibits no distension. There is no tenderness. There is no rebound.  Genitourinary: Rectal exam shows  external hemorrhoid, internal hemorrhoid, tenderness and guaiac positive stool. Rectal exam shows no mass and anal tone normal.  Musculoskeletal: Normal range of motion.  Neurological: She is alert and oriented to person, place, and time.  Skin:  Skin is warm and dry. No rash noted.  Psychiatric: She has a normal mood and affect. Her behavior is normal. Thought content normal.  Nursing note and vitals reviewed.   BP 140/82   Pulse 87   Temp 99.3 F (37.4 C) (Oral)   Resp 16   Ht 5' 4"  (1.626 m)   Wt 142 lb (64.4 kg)   SpO2 95%   BMI 24.37 kg/m   Assessment and Plan: 1. Bleeding hemorrhoid Stop Anusol per rectum Increase stool softener to daily - Ambulatory referral to [place], MD [facility]  08/01/2016

## 2016-08-05 ENCOUNTER — Ambulatory Visit (INDEPENDENT_AMBULATORY_CARE_PROVIDER_SITE_OTHER): Payer: Medicare Other | Admitting: General Surgery

## 2016-08-05 ENCOUNTER — Encounter: Payer: Self-pay | Admitting: General Surgery

## 2016-08-05 VITALS — BP 185/84 | HR 90 | Temp 98.2°F | Ht 64.0 in | Wt 142.4 lb

## 2016-08-05 DIAGNOSIS — K625 Hemorrhage of anus and rectum: Secondary | ICD-10-CM

## 2016-08-05 HISTORY — DX: Hemorrhage of anus and rectum: K62.5

## 2016-08-05 NOTE — Patient Instructions (Addendum)
We would like for you to have a Colonoscopy. We will send the referral to [facility] and someone from their office will call you to schedule an appointment. If you have any questions or concerns please call our office. We would like for you to follow up with Dr.Woodham after your Colonoscopy if a surgical problem is found.

## 2016-08-05 NOTE — Progress Notes (Signed)
Patient ID: Paige Hart, female   DOB: [birth year], 82 y.o.   MRN: [id number]  CC: Blood per rectum  HPI Paige Hart is a 82 y.o. female who presents to clinic for evaluation of bright red blood per rectum. Patient states that this is been going on for approximately 3 weeks without improvement. She was seen by her primary care provider and started on Proctofoam and given a stool softener. Patient reports that she has 1-2 soft bowel movements per day but continues to have blood per rectum. She states the blood sometimes reaches the toilet as accommodation of bright red blood and clots. Her last blood per rectum was proximally 4 hours before this clinic visit. She states this is never happened before. She is known she's had some external hemorrhoids for quite some time but has never had bleeding internal hemorrhoids before. She denies any fevers, chills, nausea, vomiting, chest pain, shortness breath, diarrhea, constipation. She reports that she does not remember ever having a colonoscopy before.  HPI  Past Medical History:  Diagnosis Date  . Cystocele   . Emphysema/COPD ([place])   . Hypertension   . Procidentia of uterus   . Thyroid disease   . Urge incontinence   . Vaginal atrophy     Past Surgical History:  Procedure Laterality Date  . OOPHORECTOMY Left 1970    Family History  Problem Relation Age of Onset  . Ovarian cancer Mother   . Mental illness Sister   . Diabetes Neg Hx   . Heart disease Neg Hx   . Breast cancer Neg Hx   . Colon cancer Neg Hx     Social History Social History  Substance Use Topics  . Smoking status: Former Research scientist (life sciences)  . Smokeless tobacco: Never Used  . Alcohol use No    No Known Allergies  Current Outpatient Prescriptions  Medication Sig Dispense Refill  . albuterol (PROVENTIL HFA;VENTOLIN HFA) 108 (90 Base) MCG/ACT inhaler Inhale 2 puffs into the lungs every 4 (four) hours as needed for wheezing or shortness of breath. 1 Inhaler 12  .  budesonide-formoterol (SYMBICORT) 160-4.5 MCG/ACT inhaler Inhale 2 puffs into the lungs 2 (two) times daily. 10.2 g 12  . Cetirizine HCl 10 MG CAPS Take 1 tablet by mouth daily.    Marland Kitchen CRANBERRY EXTRACT PO Take by mouth.    . docusate sodium (COLACE) 100 MG capsule Take 1 capsule (100 mg total) by mouth every other day. 30 capsule 2  . ESTRACE VAGINAL 0.1 MG/GM vaginal cream INSERT 0.5GRAMS VAGINALLY ONCE WEEKLY 45 g 1  . hydrocortisone (ANUSOL-HC) 2.5 % rectal cream Place 1 application rectally 2 (two) times daily. 30 g 2  . losartan (COZAAR) 100 MG tablet Take 1 tablet (100 mg total) by mouth daily. 90 tablet 3  . MULTIPLE VITAMINS-MINERALS PO Take 1 tablet by mouth daily.    Levin Erp SULFATE VAGINAL 0.025 % GEL Place 1 application vaginally once a week.     No current facility-administered medications for this visit.      Review of Systems A Multi-point review of systems was asked and was negative except for the findings documented in the history of present illness  Physical Exam Blood pressure (!) 185/84, pulse 90, temperature 98.2 F (36.8 C), temperature source Oral, height 5' 4"  (1.626 m), weight 64.6 kg (142 lb 6.4 oz). CONSTITUTIONAL: No acute distress. EYES: Pupils are equal, round, and reactive to light, Sclera are non-icteric. EARS, NOSE, MOUTH AND THROAT: The oropharynx is clear. The oral  mucosa is pink and moist. Hearing is intact to voice. LYMPH NODES:  Lymph nodes in the neck are normal. RESPIRATORY:  Lungs are clear. There is normal respiratory effort, with equal breath sounds bilaterally, and without pathologic use of accessory muscles. CARDIOVASCULAR: Heart is regular without murmurs, gallops, or rubs. GI: The abdomen is soft, nontender, and nondistended. There are no palpable masses. There is no hepatosplenomegaly. There are normal bowel sounds in all quadrants. GU: Rectal exam shows some small external hemorrhoid disease. On digital rectal exam some internal  hemorrhoids are able to be palpated however there is no return of bright red blood at the conclusion of the exam.   MUSCULOSKELETAL: Normal muscle strength and tone. No cyanosis or edema.   SKIN: Turgor is good and there are no pathologic skin lesions or ulcers. NEUROLOGIC: Motor and sensation is grossly normal. Cranial nerves are grossly intact. PSYCH:  Oriented to person, place and time. Affect is normal.  Data Reviewed Patient had labs performed 1 month ago which showed a mild leukocytosis but a normal H&H of 14.3 over 43.6. I have personally reviewed the patient's imaging, laboratory findings and medical records.    Assessment    Bright red blood per rectum    Plan    82 year old female who has been having bright red blood per rectum. Since the surgery office for evaluation of internal hemorrhoids. There is no evidence of blood during my digital rectal exam. Given her history of multiple weeks of passing a combination of bright blood with clots discussed that the next step intervention would be a colonoscopy to evaluate for other possible causes of bleeding. Discussed that should she continue to bleed and develop any lightheadedness, weakness, rapid heartbeat that she is to return to the hospital immediately for further evaluation for possible anemia. Patient voiced understanding. A GI consult will be placed today and she will follow up with [place] on an as-needed basis if the colonoscopy shows a surgically correctable problem.     Time spent with the patient was 45 minutes, with more than 50% of the time spent in face-to-face education, counseling and care coordination.     Clayburn Pert, MD FACS General Surgeon 08/05/2016, 2:45 PM

## 2016-08-07 ENCOUNTER — Other Ambulatory Visit: Payer: Self-pay

## 2016-08-07 ENCOUNTER — Telehealth: Payer: Self-pay

## 2016-08-07 NOTE — Telephone Encounter (Signed)
Patient was seen in the office on 08/05/2016 with Dr. Adonis Huguenin. The nurse told the patient that she would be contacted back to schedule a colonoscopy but the patient hasn't heard anything from the nurse and is in a lot of pain. Please call patient and advice.

## 2016-08-07 NOTE — Telephone Encounter (Signed)
I have contacted Ginger at Dr Dorothey Baseman office and she is contacting that patient at this time to do a triage over the phone and to schedule the colonoscopy.

## 2016-08-08 ENCOUNTER — Encounter: Payer: Self-pay | Admitting: *Deleted

## 2016-08-08 NOTE — Discharge Instructions (Signed)

## 2016-08-08 NOTE — Telephone Encounter (Signed)
error 

## 2016-08-11 ENCOUNTER — Ambulatory Visit: Payer: Medicare Other | Admitting: Anesthesiology

## 2016-08-11 ENCOUNTER — Ambulatory Visit
Admission: RE | Admit: 2016-08-11 | Discharge: 2016-08-11 | Disposition: A | Discharge status: Home / Self Care | Payer: Medicare Other | Source: Ambulatory Visit | Attending: Gastroenterology | Admitting: Gastroenterology

## 2016-08-11 ENCOUNTER — Encounter
Admission: RE | Disposition: A | Discharge status: Home / Self Care | Payer: Self-pay | Source: Ambulatory Visit | Attending: Gastroenterology

## 2016-08-11 DIAGNOSIS — K621 Rectal polyp: Secondary | ICD-10-CM | POA: Insufficient documentation

## 2016-08-11 DIAGNOSIS — D124 Benign neoplasm of descending colon: Secondary | ICD-10-CM | POA: Insufficient documentation

## 2016-08-11 DIAGNOSIS — Z87891 Personal history of nicotine dependence: Secondary | ICD-10-CM | POA: Insufficient documentation

## 2016-08-11 DIAGNOSIS — K529 Noninfective gastroenteritis and colitis, unspecified: Secondary | ICD-10-CM | POA: Diagnosis not present

## 2016-08-11 DIAGNOSIS — Z79899 Other long term (current) drug therapy: Secondary | ICD-10-CM | POA: Insufficient documentation

## 2016-08-11 DIAGNOSIS — K625 Hemorrhage of anus and rectum: Secondary | ICD-10-CM | POA: Diagnosis not present

## 2016-08-11 DIAGNOSIS — D123 Benign neoplasm of transverse colon: Secondary | ICD-10-CM | POA: Diagnosis not present

## 2016-08-11 DIAGNOSIS — K921 Melena: Secondary | ICD-10-CM

## 2016-08-11 DIAGNOSIS — D125 Benign neoplasm of sigmoid colon: Secondary | ICD-10-CM | POA: Diagnosis not present

## 2016-08-11 DIAGNOSIS — K635 Polyp of colon: Secondary | ICD-10-CM

## 2016-08-11 DIAGNOSIS — I10 Essential (primary) hypertension: Secondary | ICD-10-CM | POA: Insufficient documentation

## 2016-08-11 DIAGNOSIS — D120 Benign neoplasm of cecum: Secondary | ICD-10-CM | POA: Diagnosis not present

## 2016-08-11 DIAGNOSIS — D126 Benign neoplasm of colon, unspecified: Secondary | ICD-10-CM | POA: Diagnosis not present

## 2016-08-11 DIAGNOSIS — K641 Second degree hemorrhoids: Secondary | ICD-10-CM | POA: Diagnosis not present

## 2016-08-11 DIAGNOSIS — E0500 Thyrotoxicosis with diffuse goiter without thyrotoxic crisis or storm: Secondary | ICD-10-CM | POA: Insufficient documentation

## 2016-08-11 DIAGNOSIS — J439 Emphysema, unspecified: Secondary | ICD-10-CM | POA: Insufficient documentation

## 2016-08-11 DIAGNOSIS — K5289 Other specified noninfective gastroenteritis and colitis: Secondary | ICD-10-CM | POA: Diagnosis not present

## 2016-08-11 HISTORY — DX: Presence of dental prosthetic device (complete) (partial): Z97.2

## 2016-08-11 HISTORY — PX: POLYPECTOMY: SHX5525

## 2016-08-11 HISTORY — PX: COLONOSCOPY WITH PROPOFOL: SHX5780

## 2016-08-11 SURGERY — COLONOSCOPY WITH PROPOFOL
Anesthesia: Monitor Anesthesia Care | Wound class: Contaminated

## 2016-08-11 MED ORDER — STERILE WATER FOR IRRIGATION IR SOLN
Status: DC | PRN
  Administered 2016-08-11: 10:00:00

## 2016-08-11 MED ORDER — PROPOFOL 10 MG/ML IV BOLUS
INTRAVENOUS | Status: DC | PRN
  Administered 2016-08-11: 50 mg via INTRAVENOUS
  Administered 2016-08-11: 100 mg via INTRAVENOUS
  Administered 2016-08-11 (×3): 50 mg via INTRAVENOUS

## 2016-08-11 MED ORDER — LACTATED RINGERS IV SOLN
INTRAVENOUS | Status: DC
  Administered 2016-08-11: 10:00:00 via INTRAVENOUS

## 2016-08-11 MED ORDER — LIDOCAINE HCL (CARDIAC) 20 MG/ML IV SOLN
INTRAVENOUS | Status: DC | PRN
  Administered 2016-08-11: 40 mg via INTRAVENOUS

## 2016-08-11 SURGICAL SUPPLY — 23 items
CANISTER SUCT 1200ML W/VALVE (MISCELLANEOUS) ×4 IMPLANT
CLIP HMST 235XBRD CATH ROT (MISCELLANEOUS) IMPLANT
CLIP RESOLUTION 360 11X235 (MISCELLANEOUS)
FCP ESCP3.2XJMB 240X2.8X (MISCELLANEOUS)
FORCEPS BIOP RAD 4 LRG CAP 4 (CUTTING FORCEPS) IMPLANT
FORCEPS BIOP RJ4 240 W/NDL (MISCELLANEOUS)
FORCEPS ESCP3.2XJMB 240X2.8X (MISCELLANEOUS) IMPLANT
GOWN CVR UNV OPN BCK APRN NK (MISCELLANEOUS) ×4 IMPLANT
GOWN ISOL THUMB LOOP REG UNIV (MISCELLANEOUS) ×4
INJECTOR VARIJECT VIN23 (MISCELLANEOUS) IMPLANT
KIT DEFENDO VALVE AND CONN (KITS) IMPLANT
KIT ENDO PROCEDURE OLY (KITS) ×4 IMPLANT
MARKER SPOT ENDO TATTOO 5ML (MISCELLANEOUS) IMPLANT
PAD GROUND ADULT SPLIT (MISCELLANEOUS) ×4 IMPLANT
PROBE APC STR FIRE (PROBE) IMPLANT
RETRIEVER NET ROTH 2.5X230 LF (MISCELLANEOUS) IMPLANT
SNARE SHORT THROW 13M SML OVAL (MISCELLANEOUS) IMPLANT
SNARE SHORT THROW 30M LRG OVAL (MISCELLANEOUS) IMPLANT
SNARE SNG USE RND 15MM (INSTRUMENTS) IMPLANT
SPOT EX ENDOSCOPIC TATTOO (MISCELLANEOUS)
TRAP ETRAP POLY (MISCELLANEOUS) IMPLANT
VARIJECT INJECTOR VIN23 (MISCELLANEOUS)
WATER STERILE IRR 250ML POUR (IV SOLUTION) ×4 IMPLANT

## 2016-08-11 NOTE — Anesthesia Procedure Notes (Signed)
Procedure Name: MAC Date/Time: 08/11/2016 9:49 AM Performed by: Janna Arch Pre-anesthesia Checklist: Patient identified, Emergency Drugs available, Suction available and Patient being monitored Patient Re-evaluated:Patient Re-evaluated prior to inductionOxygen Delivery Method: Nasal cannula

## 2016-08-11 NOTE — Op Note (Signed)
[facility] Gastroenterology Patient Name: Paige Hart Procedure Date: 08/11/2016 9:44 AM MRN: [id number] Account #: [id number] Date of Birth: 26-Jun-1935 Admit Type: Outpatient Age: 82 Room: [facility] OR ROOM 01 Gender: Female Note Status: Finalized Procedure:            Colonoscopy Indications:          Hematochezia Providers:            [name] MD, MD Referring MD:         Halina Maidens, MD (Referring MD) Medicines:            Propofol per Anesthesia Procedure:            Pre-Anesthesia Assessment:                       - Prior to the procedure, a History and Physical was                        performed, and patient medications and allergies were                        reviewed. The patient's tolerance of previous                        anesthesia was also reviewed. The risks and benefits of                        the procedure and the sedation options and risks were                        discussed with the patient. All questions were                        answered, and informed consent was obtained. Prior                        Anticoagulants: The patient has taken no previous                        anticoagulant or antiplatelet agents. ASA Grade                        Assessment: II - A patient with mild systemic disease.                        After reviewing the risks and benefits, the patient was                        deemed in satisfactory condition to undergo the                        procedure.                       After obtaining informed consent, the colonoscope was                        passed under direct vision. Throughout the procedure,                        the patient's blood pressure,  pulse, and oxygen                        saturations were monitored continuously. The Olympus CF                        H180AL colonoscope (S#: [id number]) was introduced through                        the anus and advanced to the the cecum, identified by                        appendiceal orifice and ileocecal valve. The                        colonoscopy was performed without difficulty. The                        patient tolerated the procedure well. The quality of                        the bowel preparation was excellent. Findings:      The perianal and digital rectal examinations were normal.      A 9 mm polyp was found in the ileocecal valve. The polyp was sessile.       The polyp was removed with a hot snare. Resection and retrieval were       complete.      A 7 mm polyp was found in the transverse colon. The polyp was sessile.       The polyp was removed with a cold snare. Resection and retrieval were       complete.      A 4 mm polyp was found in the descending colon. The polyp was sessile.       The polyp was removed with a cold snare. Resection and retrieval were       complete.      A 6 mm polyp was found in the sigmoid colon. The polyp was pedunculated.       The polyp was removed with a cold snare. Resection and retrieval were       complete.      A 4 mm polyp was found in the rectum. The polyp was sessile. The polyp       was removed with a cold biopsy forceps. Resection and retrieval were       complete.      Segmental moderate inflammation characterized by erythema was found in       the rectum and in the sigmoid colon. Biopsies were taken with a cold       forceps for histology.      Non-bleeding internal hemorrhoids were found during retroflexion. The       hemorrhoids were Grade II (internal hemorrhoids that prolapse but reduce       spontaneously). Impression:           - One 9 mm polyp at the ileocecal valve, removed with a                        hot snare. Resected and retrieved.                       - One  7 mm polyp in the transverse colon, removed with                        a cold snare. Resected and retrieved.                       - One 4 mm polyp in the descending colon, removed with                         a cold snare. Resected and retrieved.                       - One 6 mm polyp in the sigmoid colon, removed with a                        cold snare. Resected and retrieved.                       - One 4 mm polyp in the rectum, removed with a cold                        biopsy forceps. Resected and retrieved.                       - Segmental moderate inflammation was found in the                        rectum and in the sigmoid colon secondary to                        proctosigmoid colitis. Biopsied.                       - Non-bleeding internal hemorrhoids. Recommendation:       - Discharge patient to home.                       - Resume previous diet.                       - Continue present medications.                       - Await pathology results. Procedure Code(s):    --- Professional ---                       [phone], Colonoscopy, flexible; with removal of tumor(s),                        polyp(s), or other lesion(s) by snare technique                       [id number], 48, Colonoscopy, flexible; with biopsy, single                        or multiple Diagnosis Code(s):    --- Professional ---                       K92.1, Melena (includes Hematochezia)  D12.0, Benign neoplasm of cecum                       D12.3, Benign neoplasm of transverse colon (hepatic                        flexure or splenic flexure)                       D12.4, Benign neoplasm of descending colon                       D12.5, Benign neoplasm of sigmoid colon                       K62.1, Rectal polyp                       K63.89, Other specified diseases of intestine CPT copyright 2016 American Medical Association. All rights reserved. The codes documented in this report are preliminary and upon coder review may  be revised to meet current compliance requirements. [name] MD, MD 08/11/2016 10:17:19 AM This report has been signed electronically. Number of Addenda: 0 Note Initiated On:  08/11/2016 9:44 AM Scope Withdrawal Time: 0 hours 14 minutes 3 seconds  Total Procedure Duration: 0 hours 18 minutes 7 seconds       [facility]

## 2016-08-11 NOTE — Anesthesia Preprocedure Evaluation (Signed)
Anesthesia Evaluation  Patient identified by MRN, date of birth, ID band  Reviewed: NPO status   History of Anesthesia Complications Negative for: history of anesthetic complications  Airway Mallampati: II  TM Distance: >3 FB Neck ROM: full    Dental  (+) Upper Dentures, Lower Dentures   Pulmonary COPD (moderate),  COPD inhaler, former smoker,    Pulmonary exam normal        Cardiovascular Exercise Tolerance: Good hypertension, Normal cardiovascular exam     Neuro/Psych negative neurological ROS  negative psych ROS   GI/Hepatic negative GI ROS, Neg liver ROS,   Endo/Other  Hyperthyroidism (graves dz)   Renal/GU negative Renal ROS   Recent UTI    Musculoskeletal   Abdominal   Peds  Hematology negative hematology ROS (+)   Anesthesia Other Findings   Reproductive/Obstetrics                             Anesthesia Physical Anesthesia Plan  ASA: II  Anesthesia Plan: MAC   Post-op Pain Management:    Induction:   Airway Management Planned:   Additional Equipment:   Intra-op Plan:   Post-operative Plan:   Informed Consent: I have reviewed the patients History and Physical, chart, labs and discussed the procedure including the risks, benefits and alternatives for the proposed anesthesia with the patient or authorized representative who has indicated his/her understanding and acceptance.     Plan Discussed with: CRNA  Anesthesia Plan Comments:         Anesthesia Quick Evaluation

## 2016-08-11 NOTE — H&P (Signed)
Paige Lame, MD [address] [place], [place] [address] Phone: [phone] Fax : [phone]  Primary Care Physician:  Halina Maidens, MD Primary Gastroenterologist:  Dr. Allen Norris  Pre-Procedure History & Physical: HPI:  Paige Hart is a 82 y.o. female is here for an colonoscopy.   Past Medical History:  Diagnosis Date  . Cystocele   . Emphysema/COPD ([place])   . Hypertension   . Procidentia of uterus   . Thyroid disease   . Urge incontinence   . Vaginal atrophy   . Wears dentures    full upper and lower    Past Surgical History:  Procedure Laterality Date  . OOPHORECTOMY Left 1970    Prior to Admission medications   Medication Sig Start Date End Date Taking? Authorizing Provider  albuterol (PROVENTIL HFA;VENTOLIN HFA) 108 (90 Base) MCG/ACT inhaler Inhale 2 puffs into the lungs every 4 (four) hours as needed for wheezing or shortness of breath. 03/27/16  Yes Glean Hess, MD  budesonide-formoterol [facility]) 160-4.5 MCG/ACT inhaler Inhale 2 puffs into the lungs 2 (two) times daily. 03/27/16  Yes Glean Hess, MD  Cetirizine HCl 10 MG CAPS Take 1 tablet by mouth daily.   Yes Historical Provider, MD  CRANBERRY EXTRACT PO Take by mouth.   Yes Historical Provider, MD  docusate sodium (COLACE) 100 MG capsule Take 1 capsule (100 mg total) by mouth every other day. 07/07/16 07/07/17 Yes Harvest Dark, MD  ESTRACE VAGINAL 0.1 MG/GM vaginal cream INSERT 0.5GRAMS VAGINALLY ONCE WEEKLY 08/07/15  Yes Alanda Slim Defrancesco, MD  hydrocortisone (ANUSOL-HC) 2.5 % rectal cream Place 1 application rectally 2 (two) times daily. 07/14/16  Yes Glean Hess, MD  losartan (COZAAR) 100 MG tablet Take 1 tablet (100 mg total) by mouth daily. 07/03/16  Yes Glean Hess, MD  MULTIPLE VITAMINS-MINERALS PO Take 1 tablet by mouth daily.   Yes Historical Provider, MD  OXYQUINOLONE SULFATE VAGINAL 0.025 % GEL Place 1 application vaginally once a week.   Yes Historical Provider,  MD    Allergies as of 08/07/2016  . (No Known Allergies)    Family History  Problem Relation Age of Onset  . Ovarian cancer Mother   . Mental illness Sister   . Diabetes Neg Hx   . Heart disease Neg Hx   . Breast cancer Neg Hx   . Colon cancer Neg Hx     Social History   Social History  . Marital status: Widowed    Spouse name: N/A  . Number of children: N/A  . Years of education: N/A   Occupational History  . Not on file.   Social History Main Topics  . Smoking status: Former Smoker    Quit date: 07/28/2004  . Smokeless tobacco: Never Used  . Alcohol use No  . Drug use: No  . Sexual activity: Not Currently   Other Topics Concern  . Not on file   Social History Narrative  . No narrative on file    Review of Systems: See HPI, otherwise negative ROS  Physical Exam: BP (!) 170/77   Pulse 89   Temp (!) 96.9 F (36.1 C) (Tympanic)   Resp 18   Ht 5' 4.5" (1.638 m)   Wt 136 lb (61.7 kg)   SpO2 94%   BMI 22.98 kg/m  General:   Alert,  pleasant and cooperative in NAD Head:  Normocephalic and atraumatic. Neck:  Supple; no masses or thyromegaly. Lungs:  Clear throughout to auscultation.  Heart:  Regular rate and rhythm. Abdomen:  Soft, nontender and nondistended. Normal bowel sounds, without guarding, and without rebound.   Neurologic:  Alert and  oriented x4;  grossly normal neurologically.  Impression/Plan: Paige Hart is here for an colonoscopy to be performed for hematochezia  Risks, benefits, limitations, and alternatives regarding  colonoscopy have been reviewed with the patient.  Questions have been answered.  All parties agreeable.   Paige Lame, MD  08/11/2016, 9:44 AM

## 2016-08-11 NOTE — Transfer of Care (Signed)
Immediate Anesthesia Transfer of Care Note  Patient: Paige Hart  Procedure(s) Performed: Procedure(s): COLONOSCOPY WITH PROPOFOL (N/A) POLYPECTOMY  Patient Location: PACU  Anesthesia Type: MAC  Level of Consciousness: awake, alert  and patient cooperative  Airway and Oxygen Therapy: Patient Spontanous Breathing and Patient connected to supplemental oxygen  Post-op Assessment: Post-op Vital signs reviewed, Patient's Cardiovascular Status Stable, Respiratory Function Stable, Patent Airway and No signs of Nausea or vomiting  Post-op Vital Signs: Reviewed and stable  Complications: No apparent anesthesia complications

## 2016-08-11 NOTE — Anesthesia Postprocedure Evaluation (Signed)
Anesthesia Post Note  Patient: Paige Hart  Procedure(s) Performed: Procedure(s) (LRB): COLONOSCOPY WITH PROPOFOL (N/A) POLYPECTOMY  Patient location during evaluation: PACU Anesthesia Type: MAC Level of consciousness: awake and alert Pain management: pain level controlled Vital Signs Assessment: post-procedure vital signs reviewed and stable Respiratory status: spontaneous breathing, nonlabored ventilation, respiratory function stable and patient connected to nasal cannula oxygen Cardiovascular status: stable and blood pressure returned to baseline Anesthetic complications: no    Faiz Weber

## 2016-08-12 ENCOUNTER — Encounter: Payer: Self-pay | Admitting: Gastroenterology

## 2016-08-18 ENCOUNTER — Encounter: Payer: Self-pay | Admitting: Gastroenterology

## 2016-08-22 ENCOUNTER — Telehealth: Payer: Self-pay

## 2016-08-22 NOTE — Telephone Encounter (Signed)
Pt has been scheduled for an office visit with Dr. Allen Norris on 09/01/16 to discuss results.

## 2016-08-22 NOTE — Telephone Encounter (Signed)
Pt has been scheduled for an office appt to discuss results.

## 2016-08-22 NOTE — Telephone Encounter (Signed)
Patient would like to get the results for her pathology from her Colonoscopy please. She is very anxious about this.

## 2016-08-22 NOTE — Telephone Encounter (Signed)
-----   Message from [name], MD sent at 08/19/2016  6:21 AM EST ----- Please have the patient come in for a follow up.

## 2016-08-27 ENCOUNTER — Ambulatory Visit: Payer: Medicare Other | Admitting: General Surgery

## 2016-09-01 ENCOUNTER — Encounter: Payer: Self-pay | Admitting: Gastroenterology

## 2016-09-01 ENCOUNTER — Ambulatory Visit (INDEPENDENT_AMBULATORY_CARE_PROVIDER_SITE_OTHER): Payer: Medicare Other | Admitting: Gastroenterology

## 2016-09-01 VITALS — BP 155/81 | HR 88 | Temp 98.1°F | Ht 64.0 in | Wt 139.0 lb

## 2016-09-01 DIAGNOSIS — K51319 Ulcerative (chronic) rectosigmoiditis with unspecified complications: Secondary | ICD-10-CM

## 2016-09-01 DIAGNOSIS — D126 Benign neoplasm of colon, unspecified: Secondary | ICD-10-CM | POA: Diagnosis not present

## 2016-09-01 MED ORDER — BUDESONIDE 3 MG PO CPEP: 9.0000 mg | ORAL_CAPSULE | ORAL | 1 refills | Status: DC

## 2016-09-01 NOTE — Progress Notes (Signed)
Primary Care Physician: Halina Maidens, MD  Primary Gastroenterologist:  Dr. [name]  Chief Complaint  Patient presents with  . Follow up Pathology results    HPI: Gerianne Hart is a 82 y.o. female here after having a colonoscopy.  The patient was found to have multiple polyps that were serrated adenomas.  The patient was also found to have inflammation in the left side of the colon.  The patient's biopsy showed it to be consistent with left-sided ulcerative colitis. The patient reports that she changed her diet and is having more formed bowel movements of the present time.  Current Outpatient Prescriptions  Medication Sig Dispense Refill  . albuterol (PROVENTIL HFA;VENTOLIN HFA) 108 (90 Base) MCG/ACT inhaler Inhale 2 puffs into the lungs every 4 (four) hours as needed for wheezing or shortness of breath. 1 Inhaler 12  . budesonide-formoterol (SYMBICORT) 160-4.5 MCG/ACT inhaler Inhale 2 puffs into the lungs 2 (two) times daily. 10.2 g 12  . Cetirizine HCl 10 MG CAPS Take 1 tablet by mouth daily.    Marland Kitchen CRANBERRY EXTRACT PO Take by mouth.    . docusate sodium (COLACE) 100 MG capsule Take 1 capsule (100 mg total) by mouth every other day. 30 capsule 2  . ESTRACE Hart 0.1 MG/GM Hart cream INSERT 0.5GRAMS VAGINALLY ONCE WEEKLY 45 g 1  . hydrocortisone (ANUSOL-HC) 2.5 % rectal cream Place 1 application rectally 2 (two) times daily. 30 g 2  . losartan (COZAAR) 100 MG tablet Take 1 tablet (100 mg total) by mouth daily. 90 tablet 3  . MULTIPLE VITAMINS-MINERALS PO Take 1 tablet by mouth daily.    Paige Hart 0.025 % GEL Place 1 application vaginally once a week.    . budesonide (ENTOCORT EC) 3 MG 24 hr capsule Take 3 capsules (9 mg total) by mouth every morning. 90 capsule 1   No current facility-administered medications for this visit.     Allergies as of 09/01/2016  . (No Known Allergies)    ROS:  General: Negative for anorexia, weight loss, fever, chills,  fatigue, weakness. ENT: Negative for hoarseness, difficulty swallowing , nasal congestion. CV: Negative for chest pain, angina, palpitations, dyspnea on exertion, peripheral edema.  Respiratory: Negative for dyspnea at rest, dyspnea on exertion, cough, sputum, wheezing.  GI: See history of present illness. GU:  Negative for dysuria, hematuria, urinary incontinence, urinary frequency, nocturnal urination.  Endo: Negative for unusual weight change.    Physical Examination:   BP (!) 155/81   Pulse 88   Temp 98.1 F (36.7 C) (Oral)   Ht 5' 4"  (1.626 m)   Wt 139 lb (63 kg)   BMI 23.86 kg/m   General: Well-nourished, well-developed in no acute distress.  Eyes: No icterus. Conjunctivae pink. Extremities: No lower extremity edema. No clubbing or deformities. Neuro: Alert and oriented x 3.  Grossly intact. Skin: Warm and dry, no jaundice.   Psych: Alert and cooperative, normal mood and affect.  Labs:    Imaging Studies: No results found.  Assessment and Plan:   Paige Hart is a 82 y.o. y/o female comes for follow-up after having a colonoscopy.  The patient was found to have multiple Serrated adenomas and she has been told that these are precancerous.  Due to her age it is questionable whether we should repeat any colonoscopies on her in the future.  The patient has been told that if she is in good health in 3 years from now she should consider having a colonoscopy.  The patient's left-sided colitis will be treated with budesonide 9 mg daily for 8 weeks to see if her symptoms improve.  The patient has been explained the plan and agrees with it.    [name], MD. Marval Regal   Note: This dictation was prepared with Dragon dictation along with smaller phrase technology. Any transcriptional errors that result from this process are unintentional.

## 2016-09-02 ENCOUNTER — Encounter: Payer: Self-pay | Admitting: Internal Medicine

## 2016-09-02 DIAGNOSIS — K5190 Ulcerative colitis, unspecified, without complications: Secondary | ICD-10-CM | POA: Insufficient documentation

## 2016-09-17 ENCOUNTER — Encounter: Payer: Self-pay | Admitting: Gastroenterology

## 2016-09-18 ENCOUNTER — Telehealth: Payer: Self-pay

## 2016-09-18 NOTE — Telephone Encounter (Signed)
Tried contacting to inform of lab results. Left vm on home number. Cell number the vm had not been set up.

## 2016-09-18 NOTE — Telephone Encounter (Signed)
-----   Message from [name], MD sent at 09/18/2016  2:30 PM EST ----- Please have the patient come in for a follow up.

## 2016-09-18 NOTE — Telephone Encounter (Signed)
Duplicate message. Pt has already followed up and is currently receiving treatment.

## 2016-09-24 ENCOUNTER — Encounter: Payer: Self-pay | Admitting: Obstetrics and Gynecology

## 2016-09-25 ENCOUNTER — Encounter: Payer: Self-pay | Admitting: Obstetrics and Gynecology

## 2016-09-25 ENCOUNTER — Ambulatory Visit (INDEPENDENT_AMBULATORY_CARE_PROVIDER_SITE_OTHER): Payer: Medicare Other | Admitting: Obstetrics and Gynecology

## 2016-09-25 VITALS — BP 150/71 | HR 93 | Ht 64.0 in | Wt 139.4 lb

## 2016-09-25 DIAGNOSIS — N813 Complete uterovaginal prolapse: Secondary | ICD-10-CM

## 2016-09-25 DIAGNOSIS — N814 Uterovaginal prolapse, unspecified: Secondary | ICD-10-CM

## 2016-09-25 DIAGNOSIS — Z4689 Encounter for fitting and adjustment of other specified devices: Secondary | ICD-10-CM

## 2016-09-25 DIAGNOSIS — N390 Urinary tract infection, site not specified: Secondary | ICD-10-CM | POA: Diagnosis not present

## 2016-09-25 LAB — POCT URINALYSIS DIPSTICK
BILIRUBIN UA: NEGATIVE
GLUCOSE UA: NEGATIVE
Ketones, UA: NEGATIVE
NITRITE UA: NEGATIVE
Protein, UA: NEGATIVE
Spec Grav, UA: 1.025
Urobilinogen, UA: NEGATIVE
pH, UA: 6.5

## 2016-09-25 NOTE — Progress Notes (Signed)
Chief complaint: 1. Cystocele 2. Vaginal atrophy 3. Procidentia 4. Pessary maintenance (last visit 06/24/2016)  12 week pessary check History of vaginal abrasion in January 2017 Gel horn pessary Premarin cream intravaginally once a week Trimosan gel intravaginally once a week  Patient reports no significant vaginal discharge, vaginal bleeding, vaginal odor or vaginal pain.  Significant interval history included a colonoscopy which showed 5 polyps, all benign, and ulcerative colitis. In addition, the patient had a UTI which had to be treated with antibiotics. She feels like she is getting back to her normal self over the past several weeks. Past medical history, past surgical history, problem list, medications, and allergies are reviewed.  OBJECTIVE: Urine dipstick shows leukocytes and blood BP (!) 150/71   Pulse 93   Ht 5' 4"  (1.626 m)   Wt 139 lb 6.4 oz (63.2 kg)   BMI 23.93 kg/m  Pleasant female in no acute distress. Alert and oriented. PELVIC: External Genitalia: Normal BUS: Normal Vagina: Fair, estrogen effect. No odor. Pessary is left in place Cervix: Not visualized Uterus: Not examined RV: Normal external exam  ASSESSMENT: 1. Cystocele 2. Uterine procidentia 3. Pessary maintenance-normal 4. History of UTI, status post antibiotic recently 5. Small leukocytes and blood noted on urine dipstick 6. New diagnosis of ulcerative colitis on colonoscopy 08/2016  PLAN: 1. Pessary is left in place this visit 2. Continue using Trimosan gel intravaginally once a week 3. Continue using Premarin cream intravaginal once a week 4. Urinalysis with urine culture is obtained 5. Return in 12 weeks for pessary maintenance  A total of 15 minutes were spent face-to-face with the patient during this encounter and over half of that time dealt with counseling and coordination of care.  Brayton Mars,  MD  Note: This dictation was prepared with Dragon dictation along with smaller phrase technology. Any transcriptional errors that result from this process are unintentional.

## 2016-09-25 NOTE — Patient Instructions (Signed)
1. Urinalysis and urine culture are sent today 2. Continue using Trimosan gel intravaginally once a week 3. Continue using Premarin cream intravaginal once a week 4. Return in 12 weeks for follow-up 5. Return sooner if vaginal discharge, vaginal bleeding, or vaginal odor develop

## 2016-09-29 LAB — URINE CULTURE

## 2016-09-30 ENCOUNTER — Telehealth: Payer: Self-pay

## 2016-09-30 MED ORDER — SULFAMETHOXAZOLE-TRIMETHOPRIM 800-160 MG PO TABS: 1.0000 | ORAL_TABLET | Freq: Two times a day (BID) | ORAL | 0 refills | Status: DC

## 2016-09-30 NOTE — Telephone Encounter (Signed)
Pt aware. Med erx. 

## 2016-09-30 NOTE — Telephone Encounter (Signed)
-----   Message from Brayton Mars, MD sent at 09/29/2016 10:24 PM EST ----- Please notify - Abnormal Labs Please call in Septra DS twice daily x 7 days

## 2016-10-07 ENCOUNTER — Encounter: Payer: Self-pay | Admitting: Internal Medicine

## 2016-10-07 ENCOUNTER — Ambulatory Visit (INDEPENDENT_AMBULATORY_CARE_PROVIDER_SITE_OTHER): Payer: Medicare Other | Admitting: Internal Medicine

## 2016-10-07 VITALS — BP 118/64 | HR 78 | Ht 64.0 in | Wt 136.0 lb

## 2016-10-07 DIAGNOSIS — K5190 Ulcerative colitis, unspecified, without complications: Secondary | ICD-10-CM | POA: Diagnosis not present

## 2016-10-07 DIAGNOSIS — N3001 Acute cystitis with hematuria: Secondary | ICD-10-CM

## 2016-10-07 DIAGNOSIS — I10 Essential (primary) hypertension: Secondary | ICD-10-CM | POA: Diagnosis not present

## 2016-10-07 LAB — POCT URINALYSIS DIPSTICK
Bilirubin, UA: NEGATIVE
Glucose, UA: NEGATIVE
KETONES UA: NEGATIVE
Nitrite, UA: NEGATIVE
PROTEIN UA: NEGATIVE
RBC UA: NEGATIVE
SPEC GRAV UA: 1.015
Urobilinogen, UA: 0.2
pH, UA: 6

## 2016-10-07 NOTE — Patient Instructions (Signed)
Use spacer with metered dose inhaler.

## 2016-10-07 NOTE — Progress Notes (Signed)
Date:  10/07/2016   Name:  Paige Hart   DOB:  1935/04/17   MRN:  [id number]   Chief Complaint: Urinary Tract Infection (Had UTI and was given antibiotic for 7 days. Just finished yesterday. Had previous UTI a month before, and concrened about continuous one's, and wants to make sure her UTI is gone. Wants to discuss meds from post-colonoscopy.  ) Urinary Tract Infection   This is a new problem. The current episode started in the past 7 days. The problem occurs intermittently. The problem has been resolved. The patient is experiencing no pain. There has been no fever. Pertinent negatives include no chills, frequency, hematuria or urgency.   Colitis - seen on colonoscopy along with several polyps.  She is now on budesonide tabs for 8 weeks - she has finished half and feels well.  No further rectal bleeding.   Review of Systems  Constitutional: Negative for chills, fatigue and fever.  Respiratory: Negative for cough, chest tightness, shortness of breath and wheezing.   Cardiovascular: Negative for chest pain.  Gastrointestinal: Negative for abdominal pain, anal bleeding, blood in stool, constipation and diarrhea.  Genitourinary: Negative for difficulty urinating, dysuria, frequency, hematuria and urgency.    Patient Active Problem List   Diagnosis Date Noted  . Ulcerative colitis ([place]) 09/02/2016  . Blood in stool   . Rectal polyp   . Polyp of sigmoid colon   . Benign neoplasm of descending colon   . Benign neoplasm of transverse colon   . Benign neoplasm of cecum   . BRBPR (bright red blood per rectum) 08/05/2016  . Chronic idiopathic constipation 07/09/2016  . Acute cystitis with hematuria 07/09/2016  . Bleeding hemorrhoid 07/03/2016  . Cystocele 12/27/2015  . Uterine procidentia 12/27/2015  . Pessary maintenance 03/22/2015  . Allergic conjunctivitis 11/17/2014  . Allergic rhinitis 11/17/2014  . Essential (primary) hypertension 11/17/2014  . Graves disease 11/17/2014  .  Bursitis of hip 11/17/2014  . Combined fat and carbohydrate induced hyperlipemia 11/17/2014  . Chronic obstructive pulmonary emphysema ([place]) 11/17/2014  . Pelvic relaxation due to uterovaginal prolapse - pessary use 11/17/2014    Prior to Admission medications   Medication Sig Start Date End Date Taking? Authorizing Provider  albuterol (PROVENTIL HFA;VENTOLIN HFA) 108 (90 Base) MCG/ACT inhaler Inhale 2 puffs into the lungs every 4 (four) hours as needed for wheezing or shortness of breath. 03/27/16  Yes Glean Hess, MD  budesonide (ENTOCORT EC) 3 MG 24 hr capsule Take 3 capsules (9 mg total) by mouth every morning. 09/01/16  Yes [name], MD  budesonide-formoterol [facility]) 160-4.5 MCG/ACT inhaler Inhale 2 puffs into the lungs 2 (two) times daily. 03/27/16  Yes Glean Hess, MD  Cetirizine HCl 10 MG CAPS Take 1 tablet by mouth daily.   Yes Historical Provider, MD  CRANBERRY EXTRACT PO Take by mouth.   Yes Historical Provider, MD  docusate sodium (COLACE) 100 MG capsule Take 1 capsule (100 mg total) by mouth every other day. 07/07/16 07/07/17 Yes Harvest Dark, MD  ESTRACE VAGINAL 0.1 MG/GM vaginal cream INSERT 0.5GRAMS VAGINALLY ONCE WEEKLY 08/07/15  Yes Alanda Slim Defrancesco, MD  losartan (COZAAR) 100 MG tablet Take 1 tablet (100 mg total) by mouth daily. 07/03/16  Yes Glean Hess, MD  MULTIPLE VITAMINS-MINERALS PO Take 1 tablet by mouth daily.   Yes Historical Provider, MD  OXYQUINOLONE SULFATE VAGINAL 0.025 % GEL Place 1 application vaginally once a week.   Yes Historical Provider, MD  No Known Allergies  Past Surgical History:  Procedure Laterality Date  . COLONOSCOPY WITH PROPOFOL N/A 08/11/2016   Procedure: COLONOSCOPY WITH PROPOFOL;  Surgeon: [name], MD;  Location: [place];  Service: Endoscopy;  Laterality: N/A;  . OOPHORECTOMY Left 1970  . POLYPECTOMY  08/11/2016   Procedure: POLYPECTOMY;  Surgeon: [name], MD;  Location: [place];   Service: Endoscopy;;    Social History  Substance Use Topics  . Smoking status: Former Smoker    Quit date: 07/28/2004  . Smokeless tobacco: Never Used  . Alcohol use No     Medication list has been reviewed and updated.   Physical Exam  Constitutional: She is oriented to person, place, and time. She appears well-developed. No distress.  HENT:  Head: Normocephalic and atraumatic.  Neck: Normal range of motion.  Cardiovascular: Normal rate, regular rhythm and normal heart sounds.   Pulmonary/Chest: Effort normal and breath sounds normal. No respiratory distress. She has no wheezes.  Abdominal: Soft. Bowel sounds are normal. She exhibits no distension and no mass. There is no tenderness. There is no rebound and no guarding.  Musculoskeletal: She exhibits no edema or tenderness.  Neurological: She is alert and oriented to person, place, and time.  Skin: Skin is warm and dry. No rash noted.  Psychiatric: She has a normal mood and affect. Her behavior is normal. Thought content normal.  Nursing note and vitals reviewed.   BP 118/64   Pulse 78   Ht 5' 4"  (1.626 m)   Wt 136 lb (61.7 kg)   SpO2 94% Comment: cold fingers  BMI 23.34 kg/m   Assessment and Plan: 1. Acute cystitis with hematuria resolved - POCT urinalysis dipstick  2. Essential (primary) hypertension controlled  3. Ulcerative colitis without complications, unspecified location ([place]) Finish tx Follow up with GI only if sx recur   No orders of the defined types were placed in this encounter.   Halina Maidens, MD [facility]  10/07/2016

## 2016-10-30 ENCOUNTER — Telehealth: Payer: Self-pay | Admitting: Gastroenterology

## 2016-10-30 ENCOUNTER — Other Ambulatory Visit: Payer: Self-pay

## 2016-10-30 MED ORDER — PREDNISONE 10 MG PO TABS: ORAL_TABLET | ORAL | 0 refills | Status: DC

## 2016-10-30 NOTE — Telephone Encounter (Signed)
Patient called and is still having problems with constipation and abd pain with rectal bleeding and the medication isn't helping. Please call patient.Marland Kitchen

## 2016-10-30 NOTE — Telephone Encounter (Signed)
Patient had colitis on her biopsies. Please start her on 40 mg of prednisone for 2 weeks then decreased by 10 mg every 2 weeks.

## 2016-10-30 NOTE — Telephone Encounter (Signed)
Patient called back to speak to you. I explained to her that you are in clinic and will return her call by the end of the day. She stated she really needs to speak to you by 5

## 2016-10-30 NOTE — Telephone Encounter (Signed)
Pt notified per Dr. Allen Norris to finish taking budesonide then start prednisone taper. Advised pt to contact me if symptoms do not improve.

## 2016-10-30 NOTE — Telephone Encounter (Signed)
Spoke with pt regarding her symptoms. Pt stated she had been doing well until recently. She has started having abdominal pain and diarrhea with some blood seen. Pt has one more dose of Budesonide left and will be done with her 8 weeks of treatment. Please advise.

## 2016-12-02 ENCOUNTER — Other Ambulatory Visit: Payer: Self-pay | Admitting: Internal Medicine

## 2016-12-24 ENCOUNTER — Encounter: Payer: Self-pay | Admitting: Obstetrics and Gynecology

## 2016-12-24 ENCOUNTER — Ambulatory Visit (INDEPENDENT_AMBULATORY_CARE_PROVIDER_SITE_OTHER): Payer: Medicare Other | Admitting: Obstetrics and Gynecology

## 2016-12-24 VITALS — BP 150/67 | HR 82 | Ht 64.0 in | Wt 143.6 lb

## 2016-12-24 DIAGNOSIS — N72 Inflammatory disease of cervix uteri: Secondary | ICD-10-CM

## 2016-12-24 DIAGNOSIS — N813 Complete uterovaginal prolapse: Secondary | ICD-10-CM

## 2016-12-24 DIAGNOSIS — Z4689 Encounter for fitting and adjustment of other specified devices: Secondary | ICD-10-CM

## 2016-12-24 NOTE — Progress Notes (Signed)
Chief complaint: 1. Cystocele 2. Vaginal atrophy 3. Procidentia 4. Pessary maintenance (last visit 09/25/2016)  12 week pessary check History of vaginal abrasion in January 2017 Gel horn pessary Premarin cream intravaginally once a week Trimosan gel intravaginally once a week  Patient reports no significant vaginal discharge, vaginal bleeding, vaginal odor or vaginal pain. Patient has had resolution of intestinal colitis.  Past medical history, past surgical history, problem list, medications, and allergies are reviewed.  OBJECTIVE: BP (!) 150/67   Pulse 82   Ht 5' 4"  (1.626 m)   Wt 143 lb 9.6 oz (65.1 kg)   BMI 24.65 kg/m  Pleasant female in no acute distress. Alert and oriented. PELVIC: External Genitalia: Normal BUS: Normal Vagina: Fair, estrogen effect. No odor.  Cervix: Parous; small cervical polyp present, patch of hyperemia without ulceration at 9:00 (pressure effect) Uterus: Not examined RV: Normal external exam  ASSESSMENT: 1. Cystocele 2. Uterine procidentia 3. Pessary maintenance- abnormal 4. Focal cervicitis, pressure effect at 9:00, without ulceration  PLAN: 1. Pessary is removed and not reinserted 2. Premarin cream intravaginal twice a week 3. Return in 2 weeks for reassessment pessary insertion  A total of 15 minutes were spent face-to-face with the patient during this encounter and over half of that time dealt with counseling and coordination of care.  Brayton Mars, MD  Note: This dictation was prepared with Dragon dictation along with smaller phrase technology. Any transcriptional errors that result from this process are unintentional.

## 2016-12-24 NOTE — Patient Instructions (Signed)
1. Slight cervical irritation is noted. 2. Leave pessary out for 2 weeks 3. Use Premarin cream intravaginal twice a week for 2 weeks 4. Hold on using Trimosan gel at this time until pessary is inserted 5. Return in 2 weeks for pessary insertion after reassessment of the cervical inflammation

## 2016-12-25 ENCOUNTER — Encounter: Payer: Self-pay | Admitting: Obstetrics and Gynecology

## 2016-12-31 ENCOUNTER — Encounter: Payer: Self-pay | Admitting: Internal Medicine

## 2017-01-01 ENCOUNTER — Encounter: Payer: Self-pay | Admitting: Internal Medicine

## 2017-01-01 ENCOUNTER — Ambulatory Visit (INDEPENDENT_AMBULATORY_CARE_PROVIDER_SITE_OTHER): Payer: Medicare Other | Admitting: Internal Medicine

## 2017-01-01 VITALS — BP 104/64 | HR 76 | Ht 64.0 in | Wt 132.0 lb

## 2017-01-01 DIAGNOSIS — J438 Other emphysema: Secondary | ICD-10-CM | POA: Diagnosis not present

## 2017-01-01 DIAGNOSIS — H1013 Acute atopic conjunctivitis, bilateral: Secondary | ICD-10-CM | POA: Diagnosis not present

## 2017-01-01 DIAGNOSIS — I10 Essential (primary) hypertension: Secondary | ICD-10-CM | POA: Diagnosis not present

## 2017-01-01 DIAGNOSIS — E0500 Thyrotoxicosis with diffuse goiter without thyrotoxic crisis or storm: Secondary | ICD-10-CM

## 2017-01-01 NOTE — Patient Instructions (Signed)
Get Fluticasone nasal spray over the counter - use 2 sprays in each nostril once a day  Claritin 10 mg once every day

## 2017-01-01 NOTE — Progress Notes (Signed)
Date:  01/01/2017   Name:  Paige Hart   DOB:  January 29, 1935   MRN:  [id number]   Chief Complaint: Hypothyroidism (Thyroid check. ) and Allergic Rhinitis  (Eyes and nose has been running for months. Trying nasal spray and eye drops. Not getting any better. When wakes up eyes are crusty. ) Thyroid Problem  Presents for follow-up (followed by Endocrinology for Graves Disease but release to return only if labs are abnormal) visit. Patient reports no constipation, diarrhea, fatigue, leg swelling, palpitations or tremors. The symptoms have been stable.  Eye Problem   This is a chronic problem. The problem occurs daily. The problem has been unchanged. There was no injury mechanism. The patient is experiencing no pain. Associated symptoms include an eye discharge (and crusty on left eye). Pertinent negatives include no fever. She has tried eye drops (claritin occasionally but not every day) for the symptoms. The treatment provided no relief.  Hypertension  This is a chronic problem. The problem is controlled. Pertinent negatives include no chest pain or palpitations. Past treatments include angiotensin blockers. The current treatment provides significant improvement. Identifiable causes of hypertension include a thyroid problem.  She uses claritin sometimes otherwise using lubricant eye drops and saline nasal spray. COPD - stable shortness of breath - no recent chest infections.  Continues on inhalers daily.   Review of Systems  Constitutional: Negative for chills, fatigue and fever.  HENT: Positive for postnasal drip. Negative for congestion and sneezing.   Eyes: Positive for discharge (and crusty on left eye). Negative for visual disturbance (has had eye exam).  Respiratory: Negative for cough, chest tightness and wheezing.   Cardiovascular: Negative for chest pain and palpitations.  Gastrointestinal: Negative for abdominal pain, constipation and diarrhea.  Neurological: Negative for tremors.     Patient Active Problem List   Diagnosis Date Noted  . Ulcerative colitis ([place]) 09/02/2016  . Blood in stool   . Rectal polyp   . Polyp of sigmoid colon   . Benign neoplasm of descending colon   . Benign neoplasm of transverse colon   . Benign neoplasm of cecum   . BRBPR (bright red blood per rectum) 08/05/2016  . Chronic idiopathic constipation 07/09/2016  . Acute cystitis with hematuria 07/09/2016  . Bleeding hemorrhoid 07/03/2016  . Cystocele 12/27/2015  . Uterine procidentia 12/27/2015  . Pessary maintenance 03/22/2015  . Allergic conjunctivitis 11/17/2014  . Allergic rhinitis 11/17/2014  . Essential (primary) hypertension 11/17/2014  . Graves disease 11/17/2014  . Bursitis of hip 11/17/2014  . Combined fat and carbohydrate induced hyperlipemia 11/17/2014  . Chronic obstructive pulmonary emphysema ([place]) 11/17/2014  . Pelvic relaxation due to uterovaginal prolapse - pessary use 11/17/2014    Prior to Admission medications   Medication Sig Start Date End Date Taking? Authorizing Provider  albuterol (PROVENTIL HFA;VENTOLIN HFA) 108 (90 Base) MCG/ACT inhaler Inhale 2 puffs into the lungs every 4 (four) hours as needed for wheezing or shortness of breath. 03/27/16  Yes Glean Hess, MD  budesonide-formoterol [facility]) 160-4.5 MCG/ACT inhaler Inhale 2 puffs into the lungs 2 (two) times daily. 03/27/16  Yes Glean Hess, MD  docusate sodium (COLACE) 100 MG capsule Take 1 capsule (100 mg total) by mouth every other day. 07/07/16 07/07/17 Yes Paduchowski, Lennette Bihari, MD  ESTRACE VAGINAL 0.1 MG/GM vaginal cream INSERT 0.5GRAMS VAGINALLY ONCE WEEKLY 08/07/15  Yes Defrancesco, Alanda Slim, MD  losartan (COZAAR) 100 MG tablet Take 1 tablet (100 mg total) by mouth daily. 07/03/16  Yes Army Melia,  Jesse Sans, MD  MULTIPLE VITAMINS-MINERALS PO Take 1 tablet by mouth daily.   Yes [provider]  OXYQUINOLONE SULFATE VAGINAL 0.025 % GEL Place 1 application vaginally once a week.   Yes  [provider]    No Known Allergies  Past Surgical History:  Procedure Laterality Date  . COLONOSCOPY WITH PROPOFOL N/A 08/11/2016   Procedure: COLONOSCOPY WITH PROPOFOL;  Surgeon: [name], MD;  Location: [place];  Service: Endoscopy;  Laterality: N/A;  . OOPHORECTOMY Left 1970  . POLYPECTOMY  08/11/2016   Procedure: POLYPECTOMY;  Surgeon: [name], MD;  Location: [place];  Service: Endoscopy;;    Social History  Substance Use Topics  . Smoking status: Former Smoker    Quit date: 07/28/2004  . Smokeless tobacco: Never Used  . Alcohol use No     Medication list has been reviewed and updated.   Physical Exam  Constitutional: She is oriented to person, place, and time. She appears well-developed. No distress.  HENT:  Head: Normocephalic and atraumatic.  Eyes: EOM are normal. Right eye exhibits no chemosis. Left eye exhibits no chemosis.  Clear drainage noted  Cardiovascular: Normal rate, regular rhythm and normal heart sounds.   Pulmonary/Chest: Effort normal and breath sounds normal. No respiratory distress. She has no wheezes.  Musculoskeletal: Normal range of motion.  Neurological: She is alert and oriented to person, place, and time.  Skin: Skin is warm and dry. No rash noted.  Psychiatric: She has a normal mood and affect. Her behavior is normal. Thought content normal.  Nursing note and vitals reviewed.   BP 104/64 (BP Location: Right Arm, Patient Position: Sitting, Cuff Size: Normal)   Pulse 76   Ht 5' 4"  (1.626 m)   Wt 132 lb (59.9 kg)   SpO2 94%   BMI 22.66 kg/m   Assessment and Plan: 1. Essential (primary) hypertension controlled - Comprehensive metabolic panel  2. Graves disease S/p treatment - TSH  3. Allergic conjunctivitis of both eyes Begin Flonase and Claritin daily  4. Other emphysema ([place]) Stable on inhalers  No orders of the defined types were placed in this encounter.   Halina Maidens, MD [facility]  01/01/2017

## 2017-01-02 LAB — COMPREHENSIVE METABOLIC PANEL
A/G RATIO: 1.6 (ref 1.2–2.2)
ALT: 13 IU/L (ref 0–32)
AST: 20 IU/L (ref 0–40)
Albumin: 4.4 g/dL (ref 3.5–4.7)
Alkaline Phosphatase: 74 IU/L (ref 39–117)
BUN / CREAT RATIO: 19 (ref 12–28)
BUN: 18 mg/dL (ref 8–27)
Bilirubin Total: 0.3 mg/dL (ref 0.0–1.2)
CHLORIDE: 101 mmol/L (ref 96–106)
CO2: 21 mmol/L (ref 18–29)
Calcium: 9.7 mg/dL (ref 8.7–10.3)
Creatinine, Ser: 0.96 mg/dL (ref 0.57–1.00)
GFR calc Af Amer: 65 mL/min/{1.73_m2} (ref >59)
GFR calc non Af Amer: 56 mL/min/{1.73_m2} — ABNORMAL LOW (ref >59)
Globulin, Total: 2.7 g/dL (ref 1.5–4.5)
Glucose: 87 mg/dL (ref 65–99)
Potassium: 4.8 mmol/L (ref 3.5–5.2)
SODIUM: 142 mmol/L (ref 134–144)
TOTAL PROTEIN: 7.1 g/dL (ref 6.0–8.5)

## 2017-01-02 LAB — TSH: TSH: 2.02 u[IU]/mL (ref 0.450–4.500)

## 2017-01-13 ENCOUNTER — Encounter: Payer: Self-pay | Admitting: Obstetrics and Gynecology

## 2017-01-13 ENCOUNTER — Ambulatory Visit (INDEPENDENT_AMBULATORY_CARE_PROVIDER_SITE_OTHER): Payer: Medicare Other | Admitting: Obstetrics and Gynecology

## 2017-01-13 VITALS — BP 164/73 | HR 81 | Ht 64.0 in | Wt 142.5 lb

## 2017-01-13 DIAGNOSIS — N813 Complete uterovaginal prolapse: Secondary | ICD-10-CM | POA: Diagnosis not present

## 2017-01-13 DIAGNOSIS — S30814D Abrasion of vagina and vulva, subsequent encounter: Secondary | ICD-10-CM | POA: Diagnosis not present

## 2017-01-13 DIAGNOSIS — N814 Uterovaginal prolapse, unspecified: Secondary | ICD-10-CM

## 2017-01-13 DIAGNOSIS — Z4689 Encounter for fitting and adjustment of other specified devices: Secondary | ICD-10-CM

## 2017-01-13 NOTE — Patient Instructions (Signed)
1. Return in 6 weeks for pessary maintenance

## 2017-01-13 NOTE — Progress Notes (Signed)
Chief complaint: 1. Pessary maintenance 2. Uterine procidentia 3. Cystocele/rectocele 4. Vaginal abrasion and cervicitis follow-up  Patient presents for follow-up 3 weeks after last visit. Gel horn pessary was removed at last visit because of vaginal hyperemia and cervix hyperemia. No erosions were encountered at last visit. Patient has been using the Premarin cream intravaginal twice a week.  Past medical history, past surgical history, problem list, medications, and allergies are reviewed  OBJECTIVE: BP (!) 164/73   Pulse 81   Ht 5' 4"  (1.626 m)   Wt 142 lb 8 oz (64.6 kg)   BMI 24.46 kg/m  Pleasant female in no acute distress. Alert and oriented. PELVIC: External Genitalia: Normal BUS: Normal Vagina: Fair, estrogen effect. No odor.  Cervix: Parous; small cervical polyp present, patch of hyperemia without ulceration at 9:00 (pressure effect), RESOLVED Uterus: Mid plane, mobile, nontender RV: Normal external exam  PROCEDURE: Gel horn pessary is inserted  ASSESSMENT: 1. Pessary maintenance 2. Vaginal abrasion and cervicitis, resolved 3. History of symptomatic pelvic relaxation with cystocele, rectocele, and uterine prolapse  PLAN: 1. Pessary is inserted 2. Premarin cream is to be continued twice a week intravaginal 3. Return in 6 weeks for pessary maintenance   A total of 15 minutes were spent face-to-face with the patient during this encounter and over half of that time dealt with counseling and coordination of care.  Brayton Mars, MD  Note: This dictation was prepared with Dragon dictation along with smaller phrase technology. Any transcriptional errors that result from this process are unintentional.

## 2017-01-20 ENCOUNTER — Ambulatory Visit: Payer: Self-pay | Admitting: Internal Medicine

## 2017-01-20 ENCOUNTER — Telehealth: Payer: Self-pay | Admitting: Internal Medicine

## 2017-01-20 ENCOUNTER — Ambulatory Visit (INDEPENDENT_AMBULATORY_CARE_PROVIDER_SITE_OTHER)
Admission: EM | Admit: 2017-01-20 | Discharge: 2017-01-20 | Disposition: A | Discharge status: Home / Self Care | Payer: Medicare Other | Source: Home / Self Care | Attending: Emergency Medicine | Admitting: Emergency Medicine

## 2017-01-20 ENCOUNTER — Ambulatory Visit: Payer: Medicare Other

## 2017-01-20 DIAGNOSIS — R05 Cough: Secondary | ICD-10-CM | POA: Diagnosis not present

## 2017-01-20 DIAGNOSIS — Z7951 Long term (current) use of inhaled steroids: Secondary | ICD-10-CM | POA: Diagnosis not present

## 2017-01-20 DIAGNOSIS — J9601 Acute respiratory failure with hypoxia: Secondary | ICD-10-CM | POA: Diagnosis not present

## 2017-01-20 DIAGNOSIS — J189 Pneumonia, unspecified organism: Secondary | ICD-10-CM

## 2017-01-20 DIAGNOSIS — I10 Essential (primary) hypertension: Secondary | ICD-10-CM | POA: Diagnosis not present

## 2017-01-20 DIAGNOSIS — R0602 Shortness of breath: Secondary | ICD-10-CM | POA: Diagnosis not present

## 2017-01-20 DIAGNOSIS — R0902 Hypoxemia: Secondary | ICD-10-CM | POA: Diagnosis not present

## 2017-01-20 DIAGNOSIS — J181 Lobar pneumonia, unspecified organism: Secondary | ICD-10-CM | POA: Diagnosis not present

## 2017-01-20 DIAGNOSIS — Z87891 Personal history of nicotine dependence: Secondary | ICD-10-CM | POA: Diagnosis not present

## 2017-01-20 DIAGNOSIS — J440 Chronic obstructive pulmonary disease with acute lower respiratory infection: Secondary | ICD-10-CM | POA: Diagnosis not present

## 2017-01-20 DIAGNOSIS — R918 Other nonspecific abnormal finding of lung field: Secondary | ICD-10-CM

## 2017-01-20 LAB — CBC WITH DIFFERENTIAL/PLATELET
BASOS ABS: 0.1 10*3/uL (ref 0–0.1)
BASOS PCT: 1 %
EOS ABS: 0.2 10*3/uL (ref 0–0.7)
Eosinophils Relative: 2 %
HEMATOCRIT: 42.9 % (ref 35.0–47.0)
HEMOGLOBIN: 13.9 g/dL (ref 12.0–16.0)
Lymphocytes Relative: 16 %
Lymphs Abs: 2 10*3/uL (ref 1.0–3.6)
MCH: 28.5 pg (ref 26.0–34.0)
MCHC: 32.3 g/dL (ref 32.0–36.0)
MCV: 88.3 fL (ref 80.0–100.0)
Monocytes Absolute: 1.1 10*3/uL — ABNORMAL HIGH (ref 0.2–0.9)
Monocytes Relative: 9 %
NEUTROS ABS: 9 10*3/uL — AB (ref 1.4–6.5)
NEUTROS PCT: 72 %
Platelets: 275 10*3/uL (ref 150–440)
RBC: 4.86 MIL/uL (ref 3.80–5.20)
RDW: 14.2 % (ref 11.5–14.5)
WBC: 12.4 10*3/uL — ABNORMAL HIGH (ref 3.6–11.0)

## 2017-01-20 LAB — BASIC METABOLIC PANEL
Anion gap: 11 (ref 5–15)
BUN: 16 mg/dL (ref 6–20)
CHLORIDE: 103 mmol/L (ref 101–111)
CO2: 25 mmol/L (ref 22–32)
CREATININE: 1 mg/dL (ref 0.44–1.00)
Calcium: 9.1 mg/dL (ref 8.9–10.3)
GFR calc Af Amer: >60 mL/min (ref >60)
GFR calc non Af Amer: 52 mL/min — ABNORMAL LOW (ref >60)
Glucose, Bld: 121 mg/dL — ABNORMAL HIGH (ref 65–99)
Potassium: 4 mmol/L (ref 3.5–5.1)
SODIUM: 139 mmol/L (ref 135–145)

## 2017-01-20 MED ORDER — AZITHROMYCIN 500 MG PO TABS: 500.0000 mg | ORAL_TABLET | Freq: Every day | ORAL | 0 refills | Status: AC

## 2017-01-20 MED ORDER — IPRATROPIUM-ALBUTEROL 0.5-2.5 (3) MG/3ML IN SOLN
3.0000 mL | Freq: Once | RESPIRATORY_TRACT | Status: AC
  Administered 2017-01-20: 3 mL via RESPIRATORY_TRACT

## 2017-01-20 MED ORDER — METHYLPREDNISOLONE SODIUM SUCC 125 MG IJ SOLR
125.0000 mg | Freq: Once | INTRAMUSCULAR | Status: AC
  Administered 2017-01-20: 125 mg via INTRAMUSCULAR

## 2017-01-20 NOTE — Discharge Instructions (Signed)
Have someone check in on you several times a day to make sure that you're doing well. Use your albuterol inhaler every 4-6 hours as needed for coughing, wheezing, shortness of breath. Continue your Symbicort twice a day. Have a very low threshold to go to the emergency Department. Go to the ER for the signs and symptoms we discussed

## 2017-01-20 NOTE — ED Triage Notes (Signed)
Patient states that she has been having trouble breathing since yesterday. Patient states that she has a history of copd and is currently on albuterol and symbicort. Patient states that she has exhausted those today.

## 2017-01-20 NOTE — ED Provider Notes (Signed)
HPI  SUBJECTIVE:  Paige Hart is a 82 y.o. female who presents with gradual onset shortness of breath starting yesterday, dyspnea on exertion at 10 feet. She reports a nonproductive cough. States her symptoms started after working outside in the heat. She reports chest tightness at the end of the day. She has tried Symbicort twice a day and has been requiring her rescue inhaler more frequently, states that she is using it with her spacer every 3 hours. Symptoms are better with the albuterol and rest, worse with exertion. She denies wheezing, chest pressure, heaviness, nausea, diaphoresis. She denies calf pain, swelling, lower extremity edema, nocturia, orthopnea, unintentional weight gain, PND, abdominal pain. No surgery in the past 4 weeks, exogenous estrogen, hemoptysis, prolonged immobilization, road trip. No history of cancer. She states that she has a 40 year pack history of smoking and quit 12 years ago. No recent steriods, antibiotics or admissions to the hospital.  She also reports intermittent left upper shoulder pain described as "a pulled muscle" starting yesterday. There are no aggravating or alleviating factors. It is not associated with movement or exertion. It does not radiate to her neck, arm or down her back. She denies nausea, diaphoresis, chest pressure or heaviness. She has never had symptoms like this before. States that she has been doing more physical activity than usual recently.  She has a past medical history of emphysema/COPD and hypertension. No history of PE, DVT, diabetes, CHF, MI, coronary artery disease. Family history negative for MI. PMD: Glean Hess, MD    Past Medical History:  Diagnosis Date  . Colitis    left sided  . Cystocele   . Emphysema/COPD ([place])   . Hypertension   . Procidentia of uterus   . Thyroid disease   . Urge incontinence   . Vaginal atrophy   . Wears dentures    full upper and lower    Past Surgical History:  Procedure  Laterality Date  . COLONOSCOPY WITH PROPOFOL N/A 08/11/2016   Procedure: COLONOSCOPY WITH PROPOFOL;  Surgeon: [name], MD;  Location: [place];  Service: Endoscopy;  Laterality: N/A;  . OOPHORECTOMY Left 1970  . POLYPECTOMY  08/11/2016   Procedure: POLYPECTOMY;  Surgeon: [name], MD;  Location: [place];  Service: Endoscopy;;    Family History  Problem Relation Age of Onset  . Ovarian cancer Mother   . Mental illness Sister   . Diabetes Neg Hx   . Heart disease Neg Hx   . Breast cancer Neg Hx   . Colon cancer Neg Hx     Social History  Substance Use Topics  . Smoking status: Former Smoker    Quit date: 07/28/2004  . Smokeless tobacco: Never Used  . Alcohol use No    No current facility-administered medications for this encounter.   Current Outpatient Prescriptions:  .  albuterol (PROVENTIL HFA;VENTOLIN HFA) 108 (90 Base) MCG/ACT inhaler, Inhale 2 puffs into the lungs every 4 (four) hours as needed for wheezing or shortness of breath., Disp: 1 Inhaler, Rfl: 12 .  budesonide-formoterol (SYMBICORT) 160-4.5 MCG/ACT inhaler, Inhale 2 puffs into the lungs 2 (two) times daily., Disp: 10.2 g, Rfl: 12 .  docusate sodium (COLACE) 100 MG capsule, Take 1 capsule (100 mg total) by mouth every other day., Disp: 30 capsule, Rfl: 2 .  ESTRACE VAGINAL 0.1 MG/GM vaginal cream, INSERT 0.5GRAMS VAGINALLY ONCE WEEKLY, Disp: 45 g, Rfl: 1 .  losartan (COZAAR) 100 MG tablet, Take 1 tablet (100 mg total)  by mouth daily., Disp: 90 tablet, Rfl: 3 .  MULTIPLE VITAMINS-MINERALS PO, Take 1 tablet by mouth daily., Disp: , Rfl:  .  OXYQUINOLONE SULFATE VAGINAL 0.025 % GEL, Place 1 application vaginally once a week., Disp: , Rfl:  .  azithromycin (ZITHROMAX) 500 MG tablet, Take 1 tablet (500 mg total) by mouth daily. Take first 2 tablets together, then 1 every day until finished., Disp: 5 tablet, Rfl: 0  No Known Allergies   ROS  As noted in HPI.   Physical Exam  BP (!) 148/67  (BP Location: Left Arm)   Pulse 79   Temp 98.7 F (37.1 C) (Oral)   Resp 19   Ht 5' 4"  (1.626 m)   Wt 142 lb (64.4 kg)   SpO2 97%   BMI 24.37 kg/m   Constitutional: Well developed, well nourished, no acute distress Eyes: PERRL, EOMI, conjunctiva normal bilaterally HENT: Normocephalic, atraumatic,mucus membranes moist Respiratory: Fair air movement, diffuse inspiratory and expiratory wheeze with prolonged expiratory phase, rhonchi at the bases. No rales.  Neck: No JVD. Patient able to tolerate lying flat comfortably Cardiovascular: Normal rate and rhythm, no murmurs, no gallops, no rubs. RP and DP pulses 2+ equal bileterally.  GI: Soft, nondistended, normal bowel sounds, nontender, no rebound, no guarding skin: No rash, skin intact Musculoskeletal: Positive left trapezial tenderness. Negative empty can test. Negative drop arm test. No pain with abduction past 90. No pain with internal/external rotation, negative liftoff test. No pain with abduction/external rotation. No clavicle, AC joint tenderness, no tenderness along the biceps tendon. Calves symmetric, nontender No edema, no tenderness, no deformities Neurologic: Alert & oriented x 3, CN II-XII grossly intact, no motor deficits, sensation grossly intact Psychiatric: Speech and behavior appropriate   ED Course   Medications  methylPREDNISolone sodium succinate (SOLU-MEDROL) 125 mg/2 mL injection 125 mg (125 mg Intramuscular Given 01/20/17 1057)  ipratropium-albuterol (DUONEB) 0.5-2.5 (3) MG/3ML nebulizer solution 3 mL (3 mLs Nebulization Given 01/20/17 1102)    Orders Placed This Encounter  Procedures  . DG Chest 2 View    Standing Status:   Standing    Number of Occurrences:   1    Order Specific Question:   Reason for Exam (SYMPTOM  OR DIAGNOSIS REQUIRED)    Answer:   r/o PNA, ptx, dissection, infarct  . CBC with Differential    Standing Status:   Standing    Number of Occurrences:   1  . Basic metabolic panel     Standing Status:   Standing    Number of Occurrences:   1  . Recheck vitals    Walking sat post duoneb and 30 min after steriod    Standing Status:   Standing    Number of Occurrences:   1  . ED EKG    Standing Status:   Standing    Number of Occurrences:   1    Order Specific Question:   Reason for Exam    Answer:   Shortness of breath  . EKG 12-Lead    Standing Status:   Standing    Number of Occurrences:   1  . EKG 12-Lead    Standing Status:   Standing    Number of Occurrences:   1  . EKG 12-Lead    Standing Status:   Standing    Number of Occurrences:   1  . EKG 12-Lead    Standing Status:   Standing    Number of Occurrences:   1  .  EKG 12-Lead    Standing Status:   Standing    Number of Occurrences:   1  . ED EKG    Repeat in 20-30 min    Standing Status:   Standing    Number of Occurrences:   1    Order Specific Question:   Reason for Exam    Answer:   Shortness of breath  . EKG 12-Lead    Standing Status:   Standing    Number of Occurrences:   1  . EKG 12-Lead    Standing Status:   Standing    Number of Occurrences:   1   Results for orders placed or performed during the hospital encounter of 01/20/17 (from the past 24 hour(s))  CBC with Differential     Status: Abnormal   Collection Time: 01/20/17 10:52 AM  Result Value Ref Range   WBC 12.4 (H) 3.6 - 11.0 K/uL   RBC 4.86 3.80 - 5.20 MIL/uL   Hemoglobin 13.9 12.0 - 16.0 g/dL   HCT 42.9 35.0 - 47.0 %   MCV 88.3 80.0 - 100.0 fL   MCH 28.5 26.0 - 34.0 pg   MCHC 32.3 32.0 - 36.0 g/dL   RDW 14.2 11.5 - 14.5 %   Platelets 275 150 - 440 K/uL   Neutrophils Relative % 72 %   Neutro Abs 9.0 (H) 1.4 - 6.5 K/uL   Lymphocytes Relative 16 %   Lymphs Abs 2.0 1.0 - 3.6 K/uL   Monocytes Relative 9 %   Monocytes Absolute 1.1 (H) 0.2 - 0.9 K/uL   Eosinophils Relative 2 %   Eosinophils Absolute 0.2 0 - 0.7 K/uL   Basophils Relative 1 %   Basophils Absolute 0.1 0 - 0.1 K/uL  Basic metabolic panel     Status: Abnormal    Collection Time: 01/20/17 10:52 AM  Result Value Ref Range   Sodium 139 135 - 145 mmol/L   Potassium 4.0 3.5 - 5.1 mmol/L   Chloride 103 101 - 111 mmol/L   CO2 25 22 - 32 mmol/L   Glucose, Bld 121 (H) 65 - 99 mg/dL   BUN 16 6 - 20 mg/dL   Creatinine, Ser 1.00 0.44 - 1.00 mg/dL   Calcium 9.1 8.9 - 10.3 mg/dL   GFR calc non Af Amer 52 (L) >60 mL/min   GFR calc Af Amer >60 >60 mL/min   Anion gap 11 5 - 15   Dg Chest 2 View  Result Date: 01/20/2017 CLINICAL DATA:  82 year old female with chronic shortness of breath which has exacerbated over the past 2 days, particularly with exertion EXAM: CHEST  2 VIEW COMPARISON:  Prior chest x-ray 07/13/2011 FINDINGS: Patchy airspace opacity extending from the suprahilar region into the left upper lobe. The remainder of the lungs are clear. No pleural effusion or pneumothorax. The lungs remain hyperinflated. Stable chronic bronchitic changes and mild interstitial prominence. Cardiac and mediastinal contours are within normal limits. Atherosclerotic calcification present in the transverse aorta. No acute osseous abnormality. IMPRESSION: Patchy airspace opacity in the left upper lobe concerning for bronchopneumonia in the appropriate clinical setting. Alternately, a central obstructing mass with postobstructive changes in the left upper lobe is a consideration. Followup PA and lateral chest X-ray is recommended in 3-4 weeks following trial of antibiotic therapy to ensure resolution and exclude underlying malignancy. Electronically Signed   By: Jacqulynn Cadet M.D.   On: 01/20/2017 11:36    ED Clinical Impression  Community acquired pneumonia of left  upper lobe of lung [facility])   ED Assessment/Plan  Initial presentation most consistent with a COPD exacerbation given that she was out working in the heat yesterday. Also the differential is CHF, MI, PE, pneumothorax, PNA. Doubt PE given absence of tachycardia and absence of risk factors. She does not appear  fluid overloaded. Checking EKG, chest x-ray, CBC, CMP. Giving Solu-Medrol and a DuoNeb. We'll check walking saturations after medication. If she is not getting better she continues to desat while walking, anticipate sending to the ED. If she has pneumonia, anticipate sending to the ED.  EKG #1 Normal sinus rhythm, rate 81. Normal axis, normal intervals. No hypertrophy. No ST-T wave changes. No previous EKG for comparison.  EKG: #2. Normal sinus rhythm, rate 75. Normal axis, normal intervals. No hypertrophy. No ST-T wave changes. No interval change from previous EKG.  BMP normal. Mild leukocytosis.  Post DuoNeb No. 1, patient states she feels significantly better. She is 97% room air. She desaturates to 90% with walking. She is no longer tachypnic and her blood pressure has improved. Repeat exam: She still has rhonchi in the left upper lobe, and still diffuse wheezing throughout.  improved air movement.   Reviewed imaging independently and discussed the x-ray with radiology. Left upper lobe opacity concerning for pneumonia versus a central obstructing mass with postobstructive changes. No effusion. See radiology report for details  The patient's left shoulder pain appears to be musculoskeletal as she has left trapezial tenderness and she states that this is where her shoulder pain is located. She has 2 normal EKGs. Doubt cardiac cause of her symptoms. Alternatively, This could be referred pain from the left upper lobe consolidation  Patient lives by  herself but states that she can have somebody check in on her several times a day. States that she does not need a refill on her albuterol.  We'll treat as an upper lobe pneumonia with high dose azithromycin 500 mg daily for 5 days. She is to continue aggressive bronchodilators albuterol 2 puffs with aero spacer every 4-4 hours as needed. Follow-up with PMD  in 7-10 days for repeat chest x-ray to ensure improvement. Patient to have a very y low threshold  to go to the ED.  Discussed labs, imaging, MDM, plan and followup with patient. Discussed sn/sx that should prompt return to the ED. Patient  agrees with plan.   Meds ordered this encounter  Medications  . methylPREDNISolone sodium succinate (SOLU-MEDROL) 125 mg/2 mL injection 125 mg  . ipratropium-albuterol (DUONEB) 0.5-2.5 (3) MG/3ML nebulizer solution 3 mL  . azithromycin (ZITHROMAX) 500 MG tablet    Sig: Take 1 tablet (500 mg total) by mouth daily. Take first 2 tablets together, then 1 every day until finished.    Dispense:  5 tablet    Refill:  0    *This clinic note was created using [employer]. Therefore, there may be occasional mistakes despite careful proofreading.  ?   Melynda Ripple, MD 01/20/17 1203

## 2017-01-22 ENCOUNTER — Telehealth: Payer: Self-pay

## 2017-01-22 ENCOUNTER — Emergency Department: Payer: Medicare Other

## 2017-01-22 ENCOUNTER — Inpatient Hospital Stay
Admission: EM | Admit: 2017-01-22 | Discharge: 2017-01-25 | DRG: 193 | Disposition: A | Discharge status: Home / Self Care | Payer: Medicare Other | Attending: Internal Medicine | Admitting: Internal Medicine

## 2017-01-22 ENCOUNTER — Encounter: Payer: Self-pay | Admitting: Emergency Medicine

## 2017-01-22 DIAGNOSIS — J45909 Unspecified asthma, uncomplicated: Secondary | ICD-10-CM | POA: Diagnosis not present

## 2017-01-22 DIAGNOSIS — J181 Lobar pneumonia, unspecified organism: Secondary | ICD-10-CM

## 2017-01-22 DIAGNOSIS — Z801 Family history of malignant neoplasm of trachea, bronchus and lung: Secondary | ICD-10-CM

## 2017-01-22 DIAGNOSIS — Z7952 Long term (current) use of systemic steroids: Secondary | ICD-10-CM

## 2017-01-22 DIAGNOSIS — R0902 Hypoxemia: Secondary | ICD-10-CM

## 2017-01-22 DIAGNOSIS — R0602 Shortness of breath: Secondary | ICD-10-CM | POA: Diagnosis not present

## 2017-01-22 DIAGNOSIS — J440 Chronic obstructive pulmonary disease with acute lower respiratory infection: Secondary | ICD-10-CM | POA: Diagnosis present

## 2017-01-22 DIAGNOSIS — J189 Pneumonia, unspecified organism: Principal | ICD-10-CM | POA: Diagnosis present

## 2017-01-22 DIAGNOSIS — Z8041 Family history of malignant neoplasm of ovary: Secondary | ICD-10-CM | POA: Diagnosis not present

## 2017-01-22 DIAGNOSIS — Z87891 Personal history of nicotine dependence: Secondary | ICD-10-CM | POA: Diagnosis not present

## 2017-01-22 DIAGNOSIS — Z79899 Other long term (current) drug therapy: Secondary | ICD-10-CM

## 2017-01-22 DIAGNOSIS — I10 Essential (primary) hypertension: Secondary | ICD-10-CM | POA: Diagnosis present

## 2017-01-22 DIAGNOSIS — J9601 Acute respiratory failure with hypoxia: Secondary | ICD-10-CM | POA: Diagnosis present

## 2017-01-22 DIAGNOSIS — Z7951 Long term (current) use of inhaled steroids: Secondary | ICD-10-CM | POA: Diagnosis not present

## 2017-01-22 DIAGNOSIS — D72829 Elevated white blood cell count, unspecified: Secondary | ICD-10-CM | POA: Diagnosis not present

## 2017-01-22 DIAGNOSIS — R05 Cough: Secondary | ICD-10-CM | POA: Diagnosis not present

## 2017-01-22 LAB — BASIC METABOLIC PANEL
Anion gap: 8 (ref 5–15)
BUN: 20 mg/dL (ref 6–20)
CO2: 26 mmol/L (ref 22–32)
CREATININE: 0.8 mg/dL (ref 0.44–1.00)
Calcium: 9.4 mg/dL (ref 8.9–10.3)
Chloride: 104 mmol/L (ref 101–111)
Glucose, Bld: 100 mg/dL — ABNORMAL HIGH (ref 65–99)
Potassium: 4 mmol/L (ref 3.5–5.1)
SODIUM: 138 mmol/L (ref 135–145)

## 2017-01-22 LAB — CBC WITH DIFFERENTIAL/PLATELET
BASOS ABS: 0.1 10*3/uL (ref 0–0.1)
BASOS PCT: 1 %
EOS ABS: 0.2 10*3/uL (ref 0–0.7)
Eosinophils Relative: 2 %
HCT: 40.8 % (ref 35.0–47.0)
HEMOGLOBIN: 13.6 g/dL (ref 12.0–16.0)
Lymphocytes Relative: 16 %
Lymphs Abs: 2.2 10*3/uL (ref 1.0–3.6)
MCH: 29.1 pg (ref 26.0–34.0)
MCHC: 33.4 g/dL (ref 32.0–36.0)
MCV: 87.2 fL (ref 80.0–100.0)
Monocytes Absolute: 1.2 10*3/uL — ABNORMAL HIGH (ref 0.2–0.9)
Monocytes Relative: 8 %
NEUTROS PCT: 73 %
Neutro Abs: 10.2 10*3/uL — ABNORMAL HIGH (ref 1.4–6.5)
PLATELETS: 271 10*3/uL (ref 150–440)
RBC: 4.68 MIL/uL (ref 3.80–5.20)
RDW: 14.3 % (ref 11.5–14.5)
WBC: 13.8 10*3/uL — AB (ref 3.6–11.0)

## 2017-01-22 MED ORDER — AZITHROMYCIN 250 MG PO TABS
250.0000 mg | ORAL_TABLET | Freq: Every day | ORAL | Status: DC
  Administered 2017-01-23 – 2017-01-25 (×3): 250 mg via ORAL
  Filled 2017-01-22 (×3): qty 1

## 2017-01-22 MED ORDER — ACETAMINOPHEN 650 MG RE SUPP: 650.0000 mg | Freq: Four times a day (QID) | RECTAL | Status: DC | PRN

## 2017-01-22 MED ORDER — ACETAMINOPHEN 325 MG PO TABS
650.0000 mg | ORAL_TABLET | Freq: Four times a day (QID) | ORAL | Status: DC | PRN
  Administered 2017-01-23: 20:00:00 650 mg via ORAL
  Filled 2017-01-22: qty 2

## 2017-01-22 MED ORDER — IPRATROPIUM-ALBUTEROL 0.5-2.5 (3) MG/3ML IN SOLN
3.0000 mL | Freq: Four times a day (QID) | RESPIRATORY_TRACT | Status: DC
  Administered 2017-01-22 – 2017-01-24 (×9): 3 mL via RESPIRATORY_TRACT
  Filled 2017-01-22 (×9): qty 3

## 2017-01-22 MED ORDER — DOCUSATE SODIUM 100 MG PO CAPS
100.0000 mg | ORAL_CAPSULE | Freq: Every day | ORAL | Status: DC
  Administered 2017-01-22 – 2017-01-25 (×4): 100 mg via ORAL
  Filled 2017-01-22 (×4): qty 1

## 2017-01-22 MED ORDER — HYDROCOD POLST-CPM POLST ER 10-8 MG/5ML PO SUER
5.0000 mL | Freq: Two times a day (BID) | ORAL | Status: DC | PRN
  Administered 2017-01-22 – 2017-01-23 (×2): 5 mL via ORAL
  Filled 2017-01-22 (×2): qty 5

## 2017-01-22 MED ORDER — DEXTROSE 5 % IV SOLN
1.0000 g | INTRAVENOUS | Status: DC
  Administered 2017-01-23 – 2017-01-25 (×3): 1 g via INTRAVENOUS
  Filled 2017-01-22 (×3): qty 10

## 2017-01-22 MED ORDER — BUDESONIDE 0.5 MG/2ML IN SUSP
0.5000 mg | Freq: Two times a day (BID) | RESPIRATORY_TRACT | Status: DC
  Administered 2017-01-22 – 2017-01-25 (×6): 0.5 mg via RESPIRATORY_TRACT
  Filled 2017-01-22 (×6): qty 2

## 2017-01-22 MED ORDER — PREDNISONE 20 MG PO TABS
20.0000 mg | ORAL_TABLET | ORAL | Status: AC
  Administered 2017-01-22: 20 mg via ORAL
  Filled 2017-01-22: qty 1

## 2017-01-22 MED ORDER — ENOXAPARIN SODIUM 40 MG/0.4ML ~~LOC~~ SOLN
40.0000 mg | SUBCUTANEOUS | Status: DC
  Administered 2017-01-22 – 2017-01-24 (×3): 40 mg via SUBCUTANEOUS
  Filled 2017-01-22 (×3): qty 0.4

## 2017-01-22 MED ORDER — BENZONATATE 100 MG PO CAPS
100.0000 mg | ORAL_CAPSULE | Freq: Three times a day (TID) | ORAL | Status: DC | PRN
  Administered 2017-01-22: 100 mg via ORAL
  Filled 2017-01-22: qty 1

## 2017-01-22 MED ORDER — ALBUTEROL SULFATE (2.5 MG/3ML) 0.083% IN NEBU
5.0000 mg | INHALATION_SOLUTION | Freq: Once | RESPIRATORY_TRACT | Status: AC
  Administered 2017-01-22: 5 mg via RESPIRATORY_TRACT
  Filled 2017-01-22: qty 6

## 2017-01-22 MED ORDER — DEXTROSE 5 % IV SOLN
1.0000 g | Freq: Once | INTRAVENOUS | Status: AC
  Administered 2017-01-22: 1 g via INTRAVENOUS
  Filled 2017-01-22: qty 10

## 2017-01-22 MED ORDER — DEXTROSE 5 % IV SOLN
500.0000 mg | Freq: Once | INTRAVENOUS | Status: AC
  Administered 2017-01-22: 500 mg via INTRAVENOUS
  Filled 2017-01-22: qty 500

## 2017-01-22 MED ORDER — LOSARTAN POTASSIUM 50 MG PO TABS
100.0000 mg | ORAL_TABLET | Freq: Every day | ORAL | Status: DC
  Administered 2017-01-22 – 2017-01-25 (×4): 100 mg via ORAL
  Filled 2017-01-22 (×5): qty 2

## 2017-01-22 NOTE — ED Triage Notes (Signed)
Patient presents to the ED with shortness of breath, worse since yesterday evening.  Patient was seen at [facility] on Tuesday and was given steroids and antibiotics.  Patient was told she had pneumonia.  Patient states she tried to call her PCP but was unable to get in touch with them.  Patient is having some difficulty speaking in full sentences at this time.

## 2017-01-22 NOTE — ED Notes (Signed)
Pt ambulated to restroom with family member and was dyspnic upon entering room , pt o2 sats were 84% on RA. MD made aware, pt placed on 2L [employer] and 98% at this time

## 2017-01-22 NOTE — ED Notes (Signed)
Sandwich tray given at this time

## 2017-01-22 NOTE — ED Provider Notes (Signed)
[facility] Emergency Department Provider Note  ____________________________________________  Time seen: Approximately 4:35 PM  I have reviewed the triage vital signs and the nursing notes.   HISTORY  Chief Complaint Shortness of Breath    HPI Paige Hart is a 82 y.o. female who complains of shortness of breath for the past 3 days. She was seen in urgent care 2 days ago where she was diagnosed with left upper lobe pneumonia started on azithromycin and discharged home. She also had a dose of Solu-Medrol that day. She also felt better after receiving bronchodilators in urgent care. She is continued her Symbicort and albuterol at home but had worsening shortness of breath since last night. She still having a productive cough. Denies fevers or chills. No syncope. No chest pain. Shortness of breath is worse with any ambulation she feels like she can barely get up and walk around. Shortness breath is constant, currently moderate intensity. No alleviating factors.     Past Medical History:  Diagnosis Date  . Colitis    left sided  . Cystocele   . Emphysema/COPD ([place])   . Hypertension   . Procidentia of uterus   . Thyroid disease   . Urge incontinence   . Vaginal atrophy   . Wears dentures    full upper and lower     Patient Active Problem List   Diagnosis Date Noted  . Ulcerative colitis ([place]) 09/02/2016  . Blood in stool   . Rectal polyp   . Polyp of sigmoid colon   . Benign neoplasm of descending colon   . Benign neoplasm of transverse colon   . Benign neoplasm of cecum   . BRBPR (bright red blood per rectum) 08/05/2016  . Chronic idiopathic constipation 07/09/2016  . Acute cystitis with hematuria 07/09/2016  . Bleeding hemorrhoid 07/03/2016  . Cystocele 12/27/2015  . Uterine procidentia 12/27/2015  . Pessary maintenance 03/22/2015  . Allergic conjunctivitis 11/17/2014  . Allergic rhinitis 11/17/2014  . Essential (primary) hypertension  11/17/2014  . Graves disease 11/17/2014  . Bursitis of hip 11/17/2014  . Combined fat and carbohydrate induced hyperlipemia 11/17/2014  . Chronic obstructive pulmonary emphysema ([place]) 11/17/2014  . Pelvic relaxation due to uterovaginal prolapse - pessary use 11/17/2014     Past Surgical History:  Procedure Laterality Date  . COLONOSCOPY WITH PROPOFOL N/A 08/11/2016   Procedure: COLONOSCOPY WITH PROPOFOL;  Surgeon: [name], MD;  Location: [place];  Service: Endoscopy;  Laterality: N/A;  . OOPHORECTOMY Left 1970  . POLYPECTOMY  08/11/2016   Procedure: POLYPECTOMY;  Surgeon: [name], MD;  Location: [place];  Service: Endoscopy;;     Prior to Admission medications   Medication Sig Start Date End Date Taking? Authorizing Provider  albuterol (PROVENTIL HFA;VENTOLIN HFA) 108 (90 Base) MCG/ACT inhaler Inhale 2 puffs into the lungs every 4 (four) hours as needed for wheezing or shortness of breath. 03/27/16   Glean Hess, MD  azithromycin (ZITHROMAX) 500 MG tablet Take 1 tablet (500 mg total) by mouth daily. Take first 2 tablets together, then 1 every day until finished. 01/20/17 01/25/17  Melynda Ripple, MD  budesonide-formoterol [facility]) 160-4.5 MCG/ACT inhaler Inhale 2 puffs into the lungs 2 (two) times daily. 03/27/16   Glean Hess, MD  docusate sodium (COLACE) 100 MG capsule Take 1 capsule (100 mg total) by mouth every other day. 07/07/16 07/07/17  Harvest Dark, MD  ESTRACE VAGINAL 0.1 MG/GM vaginal cream INSERT 0.5GRAMS VAGINALLY ONCE WEEKLY 08/07/15  Defrancesco, Alanda Slim, MD  losartan (COZAAR) 100 MG tablet Take 1 tablet (100 mg total) by mouth daily. 07/03/16   Glean Hess, MD  MULTIPLE VITAMINS-MINERALS PO Take 1 tablet by mouth daily.    [provider]  OXYQUINOLONE SULFATE VAGINAL 0.025 % GEL Place 1 application vaginally once a week.    [provider]     Allergies Patient has no known  allergies.   Family History  Problem Relation Age of Onset  . Ovarian cancer Mother   . Mental illness Sister   . Diabetes Neg Hx   . Heart disease Neg Hx   . Breast cancer Neg Hx   . Colon cancer Neg Hx     Social History Social History  Substance Use Topics  . Smoking status: Former Smoker    Quit date: 07/28/2004  . Smokeless tobacco: Never Used  . Alcohol use No    Review of Systems  Constitutional:   No fever or chills.  ENT:   No sore throat. No rhinorrhea. Cardiovascular:   No chest pain or syncope. Respiratory:   Positive shortness of breath and productive cough. Gastrointestinal:   Negative for abdominal pain, vomiting and diarrhea.  Musculoskeletal:   Negative for focal pain or swelling All other systems reviewed and are negative except as documented above in ROS and HPI.  ____________________________________________   PHYSICAL EXAM:  VITAL SIGNS: ED Triage Vitals  Enc Vitals Group     BP 01/22/17 1129 (!) 170/97     Pulse Rate 01/22/17 1129 78     Resp 01/22/17 1129 (!) 24     Temp 01/22/17 1129 98.4 F (36.9 C)     Temp Source 01/22/17 1129 Oral     SpO2 01/22/17 1129 96 %     Weight 01/22/17 1129 142 lb (64.4 kg)     Height 01/22/17 1129 5' 4"  (1.626 m)     Head Circumference --      Peak Flow --      Pain Score 01/22/17 1128 0     Pain Loc --      Pain Edu? --      Excl. in [place]? --     Vital signs reviewed, nursing assessments reviewed.   Constitutional:   Alert and oriented.Not in distress. Eyes:   No scleral icterus.  EOMI. No nystagmus. No conjunctival pallor. PERRL. ENT   Head:   Normocephalic and atraumatic.   Nose:   No congestion/rhinnorhea.    Mouth/Throat:   MMM, no pharyngeal erythema. No peritonsillar mass.    Neck:   No meningismus. Full ROM Hematological/Lymphatic/Immunilogical:   No cervical lymphadenopathy. Cardiovascular:   RRR. Symmetric bilateral radial and DP pulses.  No murmurs.  Respiratory:   Normal  respiratory effort with mild tachypnea at 24 respiratory rate.. Crackles in left upper lung. Slight expiratory wheezing. Gastrointestinal:   Soft and nontender. Non distended. There is no CVA tenderness.  No rebound, rigidity, or guarding. Genitourinary:   deferred Musculoskeletal:   Normal range of motion in all extremities. No joint effusions.  No lower extremity tenderness.  No edema. Neurologic:   Normal speech and language.  Motor grossly intact. No gross focal neurologic deficits are appreciated.  Skin:    Skin is warm, dry and intact. No rash noted.  No petechiae, purpura, or bullae.  ____________________________________________    LABS (pertinent positives/negatives) (all labs ordered are listed, but only abnormal results are displayed) Labs Reviewed  BASIC METABOLIC PANEL - Abnormal; Notable  for the following:       Result Value   Glucose, Bld 100 (*)    All other components within normal limits  CBC WITH DIFFERENTIAL/PLATELET - Abnormal; Notable for the following:    WBC 13.8 (*)    Neutro Abs 10.2 (*)    Monocytes Absolute 1.2 (*)    All other components within normal limits   ____________________________________________   EKG  Interpreted by me Sinus rhythm rate of 75, normal axis and intervals. Normal QRS ST segments and T waves.  ____________________________________________    [id number]  Dg Chest 2 View  Result Date: 01/22/2017 CLINICAL DATA:  Shortness of breath and dry cough EXAM: CHEST  2 VIEW COMPARISON:  01/20/2017 FINDINGS: Cardiac shadow is stable. The right lung remains clear. Increasing infiltrate in the left lung particularly in the left upper lobe superiorly is noted. No sizable effusion is seen. No bony abnormality is noted. IMPRESSION: Increasing left upper lobe pneumonia Electronically Signed   By: Inez Catalina M.D.   On: 01/22/2017 16:11     ____________________________________________   PROCEDURES Procedures  ____________________________________________   INITIAL IMPRESSION / ASSESSMENT AND PLAN / ED COURSE  Pertinent labs & imaging results that were available during my care of the patient were reviewed by me and considered in my medical decision making (see chart for details).  Patient presents with worsening shortness of breath and continued productive cough over the past 3 days despite 2 days of azithromycin and continued bronchodilator use. Currently wheezing is minimal, we'll give a low dose of prednisone for continued symptom relief. We'll reassess with repeat chest x-ray and labs and walking pulse ox. 2 days ago she maintained a sat of 90% when walking.  ----------------------------------------- 4:41 PM on 01/22/2017 -----------------------------------------  White blood cell count slightly elevated to 13,000. Chest x-ray shows increasing infiltrate. Patient desatted to 83% when walking and had increased work of breathing. With worsening symptoms and clinical appearance, I will advise the patient that she should be hospitalized for further management of her pneumonia.   ----------------------------------------- 5:04 PM on 01/22/2017 -----------------------------------------  Patient agrees to hospitalization. Case discussed with the hospitalist for further management.     ____________________________________________   FINAL CLINICAL IMPRESSION(S) / ED DIAGNOSES  Final diagnoses:  Community acquired pneumonia of left upper lobe of lung ([place])  Shortness of breath  Hypoxia      New Prescriptions   No medications on file     Portions of this note were generated with dragon dictation software. Dictation errors may occur despite best attempts at proofreading.    Carrie Mew, MD 01/22/17 [id number]

## 2017-01-22 NOTE — H&P (Signed)
[place] at [place] NAME: Paige Hart    MR#:  [id number]  DATE OF BIRTH:  1934/08/02  DATE OF ADMISSION:  01/22/2017  PRIMARY CARE PHYSICIAN: Glean Hess, MD   REQUESTING/REFERRING PHYSICIAN: Dr Carrie Mew  CHIEF COMPLAINT:   Chief Complaint  Patient presents with  . Shortness of Breath    HISTORY OF PRESENT ILLNESS:  Paige Hart  is a 82 y.o. female with a known history of Asthma and hypertension presents with shortness of breath and cough. She was seen at the walk-in clinic and diagnosed with pneumonia and given Zithromax only. She stated she had a real bad night last night with coughing and couldn't sleep at all. She's been short of breath and occasional wheezing. Her cough is nonproductive. Hospitalist services contacted for further evaluation for failed outpatient treatment for pneumonia.  PAST MEDICAL HISTORY:   Past Medical History:  Diagnosis Date  . Colitis    left sided  . Cystocele   . Emphysema/COPD ([place])   . Hypertension   . Procidentia of uterus   . Thyroid disease   . Urge incontinence   . Vaginal atrophy   . Wears dentures    full upper and lower    PAST SURGICAL HISTORY:   Past Surgical History:  Procedure Laterality Date  . COLONOSCOPY WITH PROPOFOL N/A 08/11/2016   Procedure: COLONOSCOPY WITH PROPOFOL;  Surgeon: [name], MD;  Location: [facility];  Service: Endoscopy;  Laterality: N/A;  . OOPHORECTOMY Left 1970  . POLYPECTOMY  08/11/2016   Procedure: POLYPECTOMY;  Surgeon: [name], MD;  Location: [facility];  Service: Endoscopy;;    SOCIAL HISTORY:   Social History  Substance Use Topics  . Smoking status: Former Smoker    Quit date: 07/28/2004  . Smokeless tobacco: Never Used  . Alcohol use No    FAMILY HISTORY:   Family History  Problem Relation Age of Onset  . Ovarian cancer Mother   . Lung cancer Father   . Mental illness Sister   .  Diabetes Neg Hx   . Heart disease Neg Hx   . Breast cancer Neg Hx   . Colon cancer Neg Hx     DRUG ALLERGIES:  No Known Allergies  REVIEW OF SYSTEMS:  CONSTITUTIONAL: No fever. Positive for fatigue.  EYES: No blurred or double vision.  EARS, NOSE, AND THROAT: No tinnitus or ear pain. No sore throat. Positive for runny nose RESPIRATORY: Positive for cough, shortness of breath, and wheezing. No hemoptysis.  CARDIOVASCULAR: No chest pain, orthopnea, edema.  GASTROINTESTINAL: No nausea, vomiting, diarrhea or abdominal pain. No blood in bowel movements. Recent colitis episode GENITOURINARY: No dysuria, hematuria.  ENDOCRINE: No polyuria, nocturia,  HEMATOLOGY: No anemia, easy bruising or bleeding SKIN: No rash or lesion. MUSCULOSKELETAL: Positive for bilateral shoulder pain  NEUROLOGIC: No tingling, numbness, weakness.  PSYCHIATRY: No anxiety or depression.   MEDICATIONS AT HOME:   Prior to Admission medications   Medication Sig Start Date End Date Taking? Authorizing Provider  albuterol (PROVENTIL HFA;VENTOLIN HFA) 108 (90 Base) MCG/ACT inhaler Inhale 2 puffs into the lungs every 4 (four) hours as needed for wheezing or shortness of breath. 03/27/16  Yes Glean Hess, MD  azithromycin (ZITHROMAX) 500 MG tablet Take 1 tablet (500 mg total) by mouth daily. Take first 2 tablets together, then 1 every day until finished. 01/20/17 01/25/17 Yes Melynda Ripple, MD  budesonide-formoterol [facility]) 160-4.5 MCG/ACT inhaler Inhale 2 puffs  into the lungs 2 (two) times daily. 03/27/16  Yes Glean Hess, MD  docusate sodium (COLACE) 100 MG capsule Take 1 capsule (100 mg total) by mouth every other day. Patient taking differently: Take 100 mg by mouth daily.  07/07/16 07/07/17 Yes Paduchowski, Lennette Bihari, MD  ESTRACE VAGINAL 0.1 MG/GM vaginal cream INSERT 0.5GRAMS VAGINALLY ONCE WEEKLY Patient taking differently: INSERT 0.5GRAMS VAGINALLY ONCE WEEKLY (Mondays) 08/07/15  Yes Defrancesco, Alanda Slim,  MD  losartan (COZAAR) 100 MG tablet Take 1 tablet (100 mg total) by mouth daily. 07/03/16  Yes Glean Hess, MD  MULTIPLE VITAMINS-MINERALS PO Take 1 tablet by mouth daily.   Yes [provider]      VITAL SIGNS:  Blood pressure (!) 170/97, pulse 78, temperature 98.4 F (36.9 C), temperature source Oral, resp. rate (!) 24, height 5' 4"  (1.626 m), weight 64.4 kg (142 lb), SpO2 96 %.  PHYSICAL EXAMINATION:  GENERAL:  82 y.o.-year-old patient lying in the bed with no acute distress.  EYES: Pupils equal, round, reactive to light and accommodation. No scleral icterus. Extraocular muscles intact.  HEENT: Head atraumatic, normocephalic. Oropharynx and nasopharynx clear.  NECK:  Supple, no jugular venous distention. No thyroid enlargement, no tenderness.  LUNGS: Normal breath sounds bilaterally, no wheezing, rales,rhonchi or crepitation. No use of accessory muscles of respiration.  CARDIOVASCULAR: S1, S2 normal. No murmurs, rubs, or gallops.  ABDOMEN: Soft, nontender, nondistended. Bowel sounds present. No organomegaly or mass.  EXTREMITIES: No pedal edema, cyanosis, or clubbing.  NEUROLOGIC: Cranial nerves II through XII are intact. Muscle strength 5/5 in all extremities. Sensation intact. Gait not checked.  PSYCHIATRIC: The patient is alert and oriented x 3.  SKIN: No rash, lesion, or ulcer.   LABORATORY PANEL:   CBC  Recent Labs Lab 01/22/17 1547  WBC 13.8*  HGB 13.6  HCT 40.8  PLT 271   ------------------------------------------------------------------------------------------------------------------  Chemistries   Recent Labs Lab 01/22/17 1547  NA 138  K 4.0  CL 104  CO2 26  GLUCOSE 100*  BUN 20  CREATININE 0.80  CALCIUM 9.4   ------------------------------------------------------------------------------------------------------------------  ----------------------------------------------------------------------------------------------  RADIOLOGY:  Dg  Chest 2 View  Result Date: 01/22/2017 CLINICAL DATA:  Shortness of breath and dry cough EXAM: CHEST  2 VIEW COMPARISON:  01/20/2017 FINDINGS: Cardiac shadow is stable. The right lung remains clear. Increasing infiltrate in the left lung particularly in the left upper lobe superiorly is noted. No sizable effusion is seen. No bony abnormality is noted. IMPRESSION: Increasing left upper lobe pneumonia Electronically Signed   By: Inez Catalina M.D.   On: 01/22/2017 16:11      IMPRESSION AND PLAN:   1. Pneumonia with leukocytosis. Patient was placed on Zithromax for pneumonia from the walk-in clinic. Failed outpatient therapy. Add Rocephin to Zithromax. Since pneumonia is in the left upper lobe I would recommend a repeat chest x-ray in 6 weeks to ensure clearing. If this does not clear I recommend a CAT scan. 2. History of asthma. Start DuoNeb and budesonide nebulizers. Hold off on steroids at this point. 3. Essential hypertension. Blood pressure high right now give dose of Cozaar now. Continue to monitor. Patient states she has white coat syndrome also. 4. Recent episode of colitis which seemed to have passed  All the records are reviewed and case discussed with ED provider. Management plans discussed with the patient, family and they are in agreement.  CODE STATUS: Full code  TOTAL TIME TAKING CARE OF THIS PATIENT: 50 minutes.    Leslye Peer,  Alichia Alridge M.D on 01/22/2017 at 5:37 PM  Between 7am to 6pm - Pager - [phone]  After 6pm call admission pager [phone]  [facility] Office  [phone]  CC: Primary care physician; Glean Hess, MD

## 2017-01-22 NOTE — Telephone Encounter (Signed)
Patient daughter in law named [place] called stating that patient was seen in Emergency Clinic for difficulty breathing. They states patient had possible pneumonia. Was given steroid injection and prednisone and was sent home. Patient today is having difficulty breathing , feeling weak, and cannot lay down. I just received the message - spoke with Dr. Army Melia and I called daughter in law back to inform her that the patient needs to be seen at ER. I noticed she is already there in her chart so I informed on Vm that where were going to tell her to go and we hope she feels better soon.

## 2017-01-23 LAB — BASIC METABOLIC PANEL
ANION GAP: 7 (ref 5–15)
BUN: 21 mg/dL — ABNORMAL HIGH (ref 6–20)
CALCIUM: 9.1 mg/dL (ref 8.9–10.3)
CO2: 29 mmol/L (ref 22–32)
Chloride: 102 mmol/L (ref 101–111)
Creatinine, Ser: 0.84 mg/dL (ref 0.44–1.00)
GFR calc Af Amer: >60 mL/min (ref >60)
GFR calc non Af Amer: >60 mL/min (ref >60)
GLUCOSE: 91 mg/dL (ref 65–99)
Potassium: 4.4 mmol/L (ref 3.5–5.1)
Sodium: 138 mmol/L (ref 135–145)

## 2017-01-23 LAB — CBC
HCT: 41.1 % (ref 35.0–47.0)
HEMOGLOBIN: 13.5 g/dL (ref 12.0–16.0)
MCH: 29.4 pg (ref 26.0–34.0)
MCHC: 32.9 g/dL (ref 32.0–36.0)
MCV: 89.4 fL (ref 80.0–100.0)
Platelets: 262 10*3/uL (ref 150–440)
RBC: 4.6 MIL/uL (ref 3.80–5.20)
RDW: 14.3 % (ref 11.5–14.5)
WBC: 12.9 10*3/uL — ABNORMAL HIGH (ref 3.6–11.0)

## 2017-01-23 LAB — PROCALCITONIN

## 2017-01-23 NOTE — Progress Notes (Signed)
Patient ambulated around unit o2 stats dropped to 84% on room air while ambulating.  Back to 92 % at rest on 2 liters, O2 was 90-92 while ambulating on 2 liters

## 2017-01-23 NOTE — Progress Notes (Signed)
[place] at [place] NAME: Lafawn Lenoir    MR#:  [id number]  DATE OF BIRTH:  November 08, 1934  SUBJECTIVE:  CHIEF COMPLAINT:   Chief Complaint  Patient presents with  . Shortness of Breath  Feeling somewhat better, still requiring 2 L oxygen REVIEW OF SYSTEMS:  Review of Systems  Constitutional: Negative for chills, fever and weight loss.  HENT: Negative for nosebleeds and sore throat.   Eyes: Negative for blurred vision.  Respiratory: Positive for cough and shortness of breath. Negative for wheezing.   Cardiovascular: Negative for chest pain, orthopnea, leg swelling and PND.  Gastrointestinal: Negative for abdominal pain, constipation, diarrhea, heartburn, nausea and vomiting.  Genitourinary: Negative for dysuria and urgency.  Musculoskeletal: Negative for back pain.  Skin: Negative for rash.  Neurological: Negative for dizziness, speech change, focal weakness and headaches.  Endo/Heme/Allergies: Does not bruise/bleed easily.  Psychiatric/Behavioral: Negative for depression.    DRUG ALLERGIES:  No Known Allergies VITALS:  Blood pressure 107/88, pulse 72, temperature 97.5 F (36.4 C), temperature source Oral, resp. rate 16, height 5' 4"  (1.626 m), weight 63.5 kg (139 lb 14.4 oz), SpO2 98 %. PHYSICAL EXAMINATION:  Physical Exam LABORATORY PANEL:  Female CBC  Recent Labs Lab 01/23/17 0407  WBC 12.9*  HGB 13.5  HCT 41.1  PLT 262   ------------------------------------------------------------------------------------------------------------------ Chemistries   Recent Labs Lab 01/23/17 0407  NA 138  K 4.4  CL 102  CO2 29  GLUCOSE 91  BUN 21*  CREATININE 0.84  CALCIUM 9.1   RADIOLOGY:  Dg Chest 2 View  Result Date: 01/22/2017 CLINICAL DATA:  Shortness of breath and dry cough EXAM: CHEST  2 VIEW COMPARISON:  01/20/2017 FINDINGS: Cardiac shadow is stable. The right lung remains clear. Increasing infiltrate in the left lung  particularly in the left upper lobe superiorly is noted. No sizable effusion is seen. No bony abnormality is noted. IMPRESSION: Increasing left upper lobe pneumonia Electronically Signed   By: Inez Catalina M.D.   On: 01/22/2017 16:11   ASSESSMENT AND PLAN:   1. Pneumonia with leukocytosis. Patient was placed on Zithromax for pneumonia from the walk-in clinic. Failed outpatient therapy.  - continue Rocephin to Zithromax. Since pneumonia is in the left upper lobe I would recommend a repeat chest x-ray in 6 weeks to ensure clearing. If this does not clear I recommend a CAT scan. - We will wean oxygen as tolerated  2. History of asthma. continue DuoNeb and budesonide nebulizers. Hold off on steroids at this point As no wheezing  3. Essential hypertension. Blood pressure high , so started on Cozaar  - Continue to monitor. Patient states she has white coat syndrome also.  4. Recent episode of colitis which seemed to have passed     All the records are reviewed and case discussed with Care Management/Social Worker. Management plans discussed with the patient, nursing and they are in agreement.  CODE STATUS: Full Code  TOTAL TIME TAKING CARE OF THIS PATIENT: 20 minutes.   More than 50% of the time was spent in counseling/coordination of care: YES  POSSIBLE D/C IN 1-2 DAYS, DEPENDING ON CLINICAL CONDITION.   Max Sane M.D on 01/23/2017 at 10:19 AM  Between 7am to 6pm - Pager - [phone]  After 6pm go to [email or web address] - Proofreader  [facility]  Office  [phone]  CC: Primary care physician; Glean Hess, MD  Note: This dictation was prepared with Dragon dictation  along with smaller phrase technology. Any transcriptional errors that result from this process are unintentional.

## 2017-01-24 LAB — CBC
HEMATOCRIT: 40.3 % (ref 35.0–47.0)
HEMOGLOBIN: 13.5 g/dL (ref 12.0–16.0)
MCH: 29.2 pg (ref 26.0–34.0)
MCHC: 33.4 g/dL (ref 32.0–36.0)
MCV: 87.5 fL (ref 80.0–100.0)
Platelets: 257 10*3/uL (ref 150–440)
RBC: 4.61 MIL/uL (ref 3.80–5.20)
RDW: 14.4 % (ref 11.5–14.5)
WBC: 10.5 10*3/uL (ref 3.6–11.0)

## 2017-01-24 LAB — BASIC METABOLIC PANEL
Anion gap: 4 — ABNORMAL LOW (ref 5–15)
BUN: 29 mg/dL — ABNORMAL HIGH (ref 6–20)
CHLORIDE: 102 mmol/L (ref 101–111)
CO2: 31 mmol/L (ref 22–32)
CREATININE: 1.03 mg/dL — AB (ref 0.44–1.00)
Calcium: 9 mg/dL (ref 8.9–10.3)
GFR calc non Af Amer: 50 mL/min — ABNORMAL LOW (ref >60)
GFR, EST AFRICAN AMERICAN: 58 mL/min — AB (ref >60)
Glucose, Bld: 87 mg/dL (ref 65–99)
POTASSIUM: 4.6 mmol/L (ref 3.5–5.1)
Sodium: 137 mmol/L (ref 135–145)

## 2017-01-24 MED ORDER — POLYETHYLENE GLYCOL 3350 17 G PO PACK
17.0000 g | PACK | Freq: Every day | ORAL | Status: DC
  Administered 2017-01-24 – 2017-01-25 (×2): 17 g via ORAL
  Filled 2017-01-24 (×2): qty 1

## 2017-01-24 NOTE — Progress Notes (Signed)
SATURATION QUALIFICATIONS: (This note is used to comply with regulatory documentation for home oxygen)  Patient Saturations on Room Air at Rest = 90-92%  Patient Saturations on Room Air while Ambulating =87%  Patient Saturations on 1 Liters of oxygen while Ambulating = 91-92%  Please briefly explain why patient needs home oxygen: pneumonia

## 2017-01-24 NOTE — Progress Notes (Signed)
   [place] at [place] NAME: Paige Hart    MR#:  [id number]  DATE OF BIRTH:  07-10-1935  SUBJECTIVE:  CHIEF COMPLAINT:   Chief Complaint  Patient presents with  . Shortness of Breath   breating improving slowly   REVIEW OF SYSTEMS:  Review of Systems  Constitutional: Negative for chills, fever and weight loss.  HENT: Negative for nosebleeds and sore throat.   Eyes: Negative for blurred vision.  Respiratory: Positive for cough and shortness of breath. Negative for wheezing.   Cardiovascular: Negative for chest pain, orthopnea, leg swelling and PND.  Gastrointestinal: Negative for abdominal pain, constipation, diarrhea, heartburn, nausea and vomiting.  Genitourinary: Negative for dysuria and urgency.  Musculoskeletal: Negative for back pain.  Skin: Negative for rash.  Neurological: Negative for dizziness, speech change, focal weakness and headaches.  Endo/Heme/Allergies: Does not bruise/bleed easily.  Psychiatric/Behavioral: Negative for depression.    DRUG ALLERGIES:  No Known Allergies VITALS:  Blood pressure 116/80, pulse 95, temperature 97.8 F (36.6 C), temperature source Oral, resp. rate 17, height 5' 4"  (1.626 m), weight 139 lb 14.4 oz (63.5 kg), SpO2 96 %. PHYSICAL EXAMINATION:  Physical Exam LABORATORY PANEL:  Female CBC  Recent Labs Lab 01/24/17 0503  WBC 10.5  HGB 13.5  HCT 40.3  PLT 257   ------------------------------------------------------------------------------------------------------------------ Chemistries   Recent Labs Lab 01/24/17 0503  NA 137  K 4.6  CL 102  CO2 31  GLUCOSE 87  BUN 29*  CREATININE 1.03*  CALCIUM 9.0   RADIOLOGY:  No results found. ASSESSMENT AND PLAN:   1. Pneumonia with leukocytosis.  - continue Rocephin to Zithromax. Since pneumonia is in the left upper lobe I would recommend a repeat chest x-ray in 6 weeks to ensure clearing. If this does not clear I recommend a  CAT scan. improving slowly  2. History of asthma. continue DuoNeb and budesonide nebulizers.  No exaceberatoin   3. Essential hypertension. Blood pressure  Was high , conitinueCozaar  - Continue to monitor. Patient states she has white coat syndrome also.  4. Recent episode of colitis which seemed to have passed States bowel movement not regular, add miralex     All the records are reviewed and case discussed with Care Management/Social Worker. Management plans discussed with the patient, nursing and they are in agreement.  CODE STATUS: Full Code  TOTAL TIME TAKING CARE OF THIS PATIENT: 32 minutes.   More than 50% of the time was spent in counseling/coordination of care: YES  POSSIBLE D/C IN 1-2 DAYS, DEPENDING ON CLINICAL CONDITION.   Dustin Flock M.D on 01/24/2017 at 1:28 PM  Between 7am to 6pm - Pager - [phone]  After 6pm go to [email or web address] - Proofreader  [facility]  [facility]  Office  [phone]  CC: Primary care physician; Glean Hess, MD  Note: This dictation was prepared with Dragon dictation along with smaller phrase technology. Any transcriptional errors that result from this process are unintentional.

## 2017-01-25 LAB — PROCALCITONIN: Procalcitonin: <0.1 ng/mL

## 2017-01-25 MED ORDER — IPRATROPIUM-ALBUTEROL 0.5-2.5 (3) MG/3ML IN SOLN
3.0000 mL | Freq: Three times a day (TID) | RESPIRATORY_TRACT | Status: DC
  Administered 2017-01-25: 08:00:00 3 mL via RESPIRATORY_TRACT
  Filled 2017-01-25: qty 3

## 2017-01-25 MED ORDER — GUAIFENESIN-DM 100-10 MG/5ML PO SYRP: 5.0000 mL | ORAL_SOLUTION | ORAL | 0 refills | Status: DC | PRN

## 2017-01-25 MED ORDER — CEFUROXIME AXETIL 500 MG PO TABS: 500.0000 mg | ORAL_TABLET | Freq: Two times a day (BID) | ORAL | 0 refills | Status: AC

## 2017-01-25 MED ORDER — IPRATROPIUM-ALBUTEROL 0.5-2.5 (3) MG/3ML IN SOLN
3.0000 mL | Freq: Four times a day (QID) | RESPIRATORY_TRACT | Status: DC | PRN
  Administered 2017-01-25: 05:00:00 3 mL via RESPIRATORY_TRACT
  Filled 2017-01-25: qty 3

## 2017-01-25 NOTE — Discharge Summary (Signed)
[place] at [facility], 82 y.o., DOB 1934/09/08, MRN [id number]. Admission date: 01/22/2017 Discharge Date 01/25/2017 Primary MD Glean Hess, MD Admitting Physician Loletha Grayer, MD  Admission Diagnosis  Shortness of breath [R06.02] Hypoxia [R09.02] Community acquired pneumonia of left upper lobe of lung [facility]) [J18.1]  Discharge Diagnosis   Active Problems:   Community-acquired pneumonia    Acute hypoxic respiratory failure  History of recent colitis COPD without acute exasperation Essential hypertension Urgent incontinence       [name]  is a 82 y.o. female with a known history of Asthma and hypertension presents with shortness of breath and cough. She was seen at the walk-in clinic and diagnosed with pneumonia and given Zithromax only. Patient came to the ED and was noted to worsening chest x-ray therefore was admitted for pneumonia. She was started on therapy with IV antibiotics with significant improvement in her symptoms. She is doing much better today is off oxygen therapy. She does have history of COPD and has not seen any pulmonary physician in the past needs outpatient pulmonary follow-up.             Consults  None  Significant Tests:  See full reports for all details     Dg Chest 2 View  Result Date: 01/22/2017 CLINICAL DATA:  Shortness of breath and dry cough EXAM: CHEST  2 VIEW COMPARISON:  01/20/2017 FINDINGS: Cardiac shadow is stable. The right lung remains clear. Increasing infiltrate in the left lung particularly in the left upper lobe superiorly is noted. No sizable effusion is seen. No bony abnormality is noted. IMPRESSION: Increasing left upper lobe pneumonia Electronically Signed   By: Inez Catalina M.D.   On: 01/22/2017 16:11   Dg Chest 2 View  Result Date: 01/20/2017 CLINICAL DATA:  82 year old female with chronic shortness of breath which has exacerbated over the past 2 days,  particularly with exertion EXAM: CHEST  2 VIEW COMPARISON:  Prior chest x-ray 07/13/2011 FINDINGS: Patchy airspace opacity extending from the suprahilar region into the left upper lobe. The remainder of the lungs are clear. No pleural effusion or pneumothorax. The lungs remain hyperinflated. Stable chronic bronchitic changes and mild interstitial prominence. Cardiac and mediastinal contours are within normal limits. Atherosclerotic calcification present in the transverse aorta. No acute osseous abnormality. IMPRESSION: Patchy airspace opacity in the left upper lobe concerning for bronchopneumonia in the appropriate clinical setting. Alternately, a central obstructing mass with postobstructive changes in the left upper lobe is a consideration. Followup PA and lateral chest X-ray is recommended in 3-4 weeks following trial of antibiotic therapy to ensure resolution and exclude underlying malignancy. Electronically Signed   By: Jacqulynn Cadet M.D.   On: 01/20/2017 11:36       Today   Subjective:   Paige Hart  feeling much better shortness of breath resolved  Objective:   Blood pressure (!) 156/81, pulse 91, temperature 98.1 F (36.7 C), temperature source Oral, resp. rate 18, height 5' 4"  (1.626 m), weight 139 lb 14.4 oz (63.5 kg), SpO2 92 %.  .  Intake/Output Summary (Last 24 hours) at 01/25/17 1041 Last data filed at 01/25/17 0800  Gross per 24 hour  Intake              840 ml  Output                0 ml  Net  840 ml    Exam VITAL SIGNS: Blood pressure (!) 156/81, pulse 91, temperature 98.1 F (36.7 C), temperature source Oral, resp. rate 18, height 5' 4"  (1.626 m), weight 139 lb 14.4 oz (63.5 kg), SpO2 92 %.  GENERAL:  82 y.o.-year-old patient lying in the bed with no acute distress.  EYES: Pupils equal, round, reactive to light and accommodation. No scleral icterus. Extraocular muscles intact.  HEENT: Head atraumatic, normocephalic. Oropharynx and nasopharynx clear.   NECK:  Supple, no jugular venous distention. No thyroid enlargement, no tenderness.  LUNGS: Normal breath sounds bilaterally, no wheezing, rales,rhonchi or crepitation. No use of accessory muscles of respiration.  CARDIOVASCULAR: S1, S2 normal. No murmurs, rubs, or gallops.  ABDOMEN: Soft, nontender, nondistended. Bowel sounds present. No organomegaly or mass.  EXTREMITIES: No pedal edema, cyanosis, or clubbing.  NEUROLOGIC: Cranial nerves II through XII are intact. Muscle strength 5/5 in all extremities. Sensation intact. Gait not checked.  PSYCHIATRIC: The patient is alert and oriented x 3.  SKIN: No obvious rash, lesion, or ulcer.   Data Review     CBC w Diff: Lab Results  Component Value Date   WBC 10.5 01/24/2017   HGB 13.5 01/24/2017   HGB 13.4 03/27/2016   HCT 40.3 01/24/2017   HCT 41.6 03/27/2016   PLT 257 01/24/2017   PLT 258 03/27/2016   LYMPHOPCT 16 01/22/2017   MONOPCT 8 01/22/2017   EOSPCT 2 01/22/2017   BASOPCT 1 01/22/2017   CMP: Lab Results  Component Value Date   NA 137 01/24/2017   NA 142 01/01/2017   K 4.6 01/24/2017   CL 102 01/24/2017   CO2 31 01/24/2017   BUN 29 (H) 01/24/2017   BUN 18 01/01/2017   CREATININE 1.03 (H) 01/24/2017   PROT 7.1 01/01/2017   ALBUMIN 4.4 01/01/2017   BILITOT 0.3 01/01/2017   ALKPHOS 74 01/01/2017   AST 20 01/01/2017   ALT 13 01/01/2017  .  Micro Results No results found for this or any previous visit (from the past 240 hour(s)).      Code Status Orders        Start     Ordered   01/22/17 1735  Full code  Continuous     01/22/17 1734    Code Status History    Date Active Date Inactive Code Status Order ID Comments User Context   This patient has a current code status but no historical code status.    Advance Directive Documentation     Most Recent Value  Type of Advance Directive  Healthcare Power of Attorney, Living will  Pre-existing out of facility DNR order (yellow form or pink MOST form)  -   "MOST" Form in Place?  -          Follow-up Information    Glean Hess, MD Follow up in 1 week(s).   Specialty:  Internal Medicine Contact information: [address] [place] [address] [phone]        Wilhelmina Mcardle, MD Follow up in 2 week(s).   Specialty:  Pulmonary Disease Why:  pft's , copd Contact information: [place] [address] [phone]           Discharge Medications   Allergies as of 01/25/2017   No Known Allergies     Medication List    TAKE these medications   albuterol 108 (90 Base) MCG/ACT inhaler Commonly known as:  PROVENTIL HFA;VENTOLIN HFA Inhale 2  puffs into the lungs every 4 (four) hours as needed for wheezing or shortness of breath.   azithromycin 500 MG tablet Commonly known as:  ZITHROMAX Take 1 tablet (500 mg total) by mouth daily. Take first 2 tablets together, then 1 every day until finished.   budesonide-formoterol 160-4.5 MCG/ACT inhaler Commonly known as:  SYMBICORT Inhale 2 puffs into the lungs 2 (two) times daily.   cefUROXime 500 MG tablet Commonly known as:  CEFTIN Take 1 tablet (500 mg total) by mouth 2 (two) times daily with a meal.   docusate sodium 100 MG capsule Commonly known as:  COLACE Take 1 capsule (100 mg total) by mouth every other day. What changed:  when to take this   ESTRACE VAGINAL 0.1 MG/GM vaginal cream Generic drug:  estradiol INSERT 0.5GRAMS VAGINALLY ONCE WEEKLY What changed:  See the new instructions.   guaiFENesin-dextromethorphan 100-10 MG/5ML syrup Commonly known as:  ROBITUSSIN DM Take 5 mLs by mouth every 4 (four) hours as needed for cough.   losartan 100 MG tablet Commonly known as:  COZAAR Take 1 tablet (100 mg total) by mouth daily.   MULTIPLE VITAMINS-MINERALS PO Take 1 tablet by mouth daily.          Total Time in preparing paper work, data evaluation and todays exam - 35 minutes  Dustin Flock M.D on  01/25/2017 at 10:41 AM  [facility]   Office  [phone]

## 2017-01-25 NOTE — Progress Notes (Signed)
Pt being discharged home, discharge instructions and prescriptions reviewed with pt, states understanding, pt with no complaints

## 2017-02-02 ENCOUNTER — Ambulatory Visit
Admission: RE | Admit: 2017-02-02 | Discharge: 2017-02-02 | Disposition: A | Discharge status: Home / Self Care | Payer: Medicare Other | Source: Ambulatory Visit | Attending: Internal Medicine | Admitting: Internal Medicine

## 2017-02-02 ENCOUNTER — Encounter: Payer: Self-pay | Admitting: Internal Medicine

## 2017-02-02 ENCOUNTER — Ambulatory Visit (INDEPENDENT_AMBULATORY_CARE_PROVIDER_SITE_OTHER): Payer: Medicare Other | Admitting: Internal Medicine

## 2017-02-02 VITALS — BP 140/72 | HR 72 | Temp 98.4°F | Resp 14 | Ht 64.0 in | Wt 142.0 lb

## 2017-02-02 DIAGNOSIS — I10 Essential (primary) hypertension: Secondary | ICD-10-CM | POA: Diagnosis not present

## 2017-02-02 DIAGNOSIS — R0602 Shortness of breath: Secondary | ICD-10-CM | POA: Diagnosis not present

## 2017-02-02 DIAGNOSIS — J181 Lobar pneumonia, unspecified organism: Secondary | ICD-10-CM | POA: Diagnosis not present

## 2017-02-02 DIAGNOSIS — J189 Pneumonia, unspecified organism: Secondary | ICD-10-CM

## 2017-02-02 DIAGNOSIS — J438 Other emphysema: Secondary | ICD-10-CM | POA: Diagnosis not present

## 2017-02-02 NOTE — Progress Notes (Signed)
Date:  02/02/2017   Name:  Paige Hart   DOB:  1935/04/28   MRN:  [id number]   Chief Complaint: Hospitalization Follow-up (Pneumonia followup from hospital- Was seen at [facility]. Still having trouble with breathing- not getting any better. Wants to discuss nebulizer- ) Admitted to [facility] 01/22/17 to 01/25/17 for CAP of the left upper lobe of lung. She had failed to respond to out patient treatment with Zithromax.  Treatment was expanded to include Ceftin. She finished antibiotics 2 days ago.  She still has a cough but it is dry.   She is able to do some light house work.  She does feel that she is gradually improving.  She is using albuteral every 5-6 hours with benefit.  She also uses symbicort bid. She has an appt with Dr. Jamal Collin, pulmonologist, at the end of this month.  Review of Systems  Constitutional: Negative for chills, fatigue, fever and unexpected weight change.  Eyes: Negative for visual disturbance.  Respiratory: Positive for cough, shortness of breath and wheezing. Negative for chest tightness.   Cardiovascular: Negative for chest pain, palpitations and leg swelling.  Gastrointestinal: Positive for constipation. Negative for abdominal pain.  Skin: Negative for rash.  Neurological: Negative for dizziness and headaches.  Hematological: Negative for adenopathy.  Psychiatric/Behavioral: Negative for sleep disturbance.    Patient Active Problem List   Diagnosis Date Noted  . Pneumonia 01/22/2017  . Ulcerative colitis ([place]) 09/02/2016  . Blood in stool   . Rectal polyp   . Polyp of sigmoid colon   . Benign neoplasm of descending colon   . Benign neoplasm of transverse colon   . Benign neoplasm of cecum   . BRBPR (bright red blood per rectum) 08/05/2016  . Chronic idiopathic constipation 07/09/2016  . Acute cystitis with hematuria 07/09/2016  . Bleeding hemorrhoid 07/03/2016  . Cystocele 12/27/2015  . Uterine procidentia 12/27/2015  . Pessary maintenance 03/22/2015  .  Allergic conjunctivitis 11/17/2014  . Allergic rhinitis 11/17/2014  . Essential (primary) hypertension 11/17/2014  . Graves disease 11/17/2014  . Bursitis of hip 11/17/2014  . Combined fat and carbohydrate induced hyperlipemia 11/17/2014  . Chronic obstructive pulmonary emphysema ([place]) 11/17/2014  . Pelvic relaxation due to uterovaginal prolapse - pessary use 11/17/2014    Prior to Admission medications   Medication Sig Start Date End Date Taking? Authorizing Provider  albuterol (PROVENTIL HFA;VENTOLIN HFA) 108 (90 Base) MCG/ACT inhaler Inhale 2 puffs into the lungs every 4 (four) hours as needed for wheezing or shortness of breath. 03/27/16  Yes Glean Hess, MD  budesonide-formoterol [facility]) 160-4.5 MCG/ACT inhaler Inhale 2 puffs into the lungs 2 (two) times daily. 03/27/16  Yes Glean Hess, MD  docusate sodium (COLACE) 100 MG capsule Take 1 capsule (100 mg total) by mouth every other day. Patient taking differently: Take 100 mg by mouth daily.  07/07/16 07/07/17 Yes Harvest Dark, MD  ESTRACE VAGINAL 0.1 MG/GM vaginal cream INSERT 0.5GRAMS VAGINALLY ONCE WEEKLY Patient taking differently: INSERT 0.5GRAMS VAGINALLY ONCE WEEKLY (Mondays) 08/07/15  Yes Defrancesco, Alanda Slim, MD  guaiFENesin-dextromethorphan (ROBITUSSIN DM) 100-10 MG/5ML syrup Take 5 mLs by mouth every 4 (four) hours as needed for cough. 01/25/17  Yes Dustin Flock, MD  losartan (COZAAR) 100 MG tablet Take 1 tablet (100 mg total) by mouth daily. 07/03/16  Yes Glean Hess, MD  MULTIPLE VITAMINS-MINERALS PO Take 1 tablet by mouth daily.   Yes [provider]    No Known Allergies  Past Surgical History:  Procedure Laterality Date  . COLONOSCOPY WITH PROPOFOL N/A 08/11/2016   Procedure: COLONOSCOPY WITH PROPOFOL;  Surgeon: [name], MD;  Location: [place];  Service: Endoscopy;  Laterality: N/A;  . OOPHORECTOMY Left 1970  . POLYPECTOMY  08/11/2016   Procedure: POLYPECTOMY;   Surgeon: [name], MD;  Location: [place];  Service: Endoscopy;;    Social History  Substance Use Topics  . Smoking status: Former Smoker    Quit date: 07/28/2004  . Smokeless tobacco: Never Used  . Alcohol use No     Medication list has been reviewed and updated.   Physical Exam  Constitutional: She is oriented to person, place, and time. She appears well-developed. No distress.  HENT:  Head: Normocephalic and atraumatic.  Cardiovascular: Normal rate, regular rhythm and normal heart sounds.   Pulmonary/Chest: Effort normal. No respiratory distress. She has decreased breath sounds in the left upper field. She has no wheezes. She has no rhonchi. She has no rales.  Abdominal: There is no tenderness.  Musculoskeletal: Normal range of motion.  Neurological: She is alert and oriented to person, place, and time.  Skin: Skin is warm and dry. No rash noted.  Psychiatric: She has a normal mood and affect. Her speech is normal and behavior is normal. Thought content normal.  Nursing note and vitals reviewed.   BP 140/72   Pulse 72   Ht 5' 4"  (1.626 m)   Wt 142 lb (64.4 kg)   SpO2 93%   BMI 24.37 kg/m   Assessment and Plan: 1. Pneumonia of left upper lobe due to infectious organism ([place]) CXR to rule out progression of infiltrate Continue symbicort, MDI albuterol See Pulmonary as planned - discuss nebulizer - DG Chest 2 View; Future  2. Essential (primary) hypertension controlled  3. Other emphysema ([place]) Pulmonary evaluation pending   No orders of the defined types were placed in this encounter.   Halina Maidens, MD [facility]  02/02/2017

## 2017-02-04 ENCOUNTER — Telehealth: Payer: Self-pay | Admitting: Gastroenterology

## 2017-02-04 NOTE — Telephone Encounter (Signed)
Patient stated she just got out of the hospital with pneumonia and her colitis is starting to flare up. She needs some advise. Please call this afternoon. She is running to the drug store this morning.

## 2017-02-05 ENCOUNTER — Other Ambulatory Visit: Payer: Self-pay

## 2017-02-05 MED ORDER — BUDESONIDE 3 MG PO CPEP: 9.0000 mg | ORAL_CAPSULE | ORAL | 1 refills | Status: DC

## 2017-02-05 NOTE — Telephone Encounter (Signed)
Please okay for me to refill pt's Budesonide 90m. Her Colitis is flaring back up.

## 2017-02-07 ENCOUNTER — Ambulatory Visit (INDEPENDENT_AMBULATORY_CARE_PROVIDER_SITE_OTHER)
Admission: EM | Admit: 2017-02-07 | Discharge: 2017-02-07 | Disposition: A | Discharge status: Other Acute Inpatient Hospital | Payer: Medicare Other | Source: Home / Self Care | Attending: Family Medicine | Admitting: Family Medicine

## 2017-02-07 ENCOUNTER — Emergency Department: Payer: Medicare Other

## 2017-02-07 ENCOUNTER — Inpatient Hospital Stay
Admission: EM | Admit: 2017-02-07 | Discharge: 2017-02-08 | DRG: 195 | Disposition: A | Discharge status: Home / Self Care | Payer: Medicare Other | Attending: Internal Medicine | Admitting: Internal Medicine

## 2017-02-07 ENCOUNTER — Encounter: Payer: Self-pay | Admitting: Emergency Medicine

## 2017-02-07 DIAGNOSIS — Z801 Family history of malignant neoplasm of trachea, bronchus and lung: Secondary | ICD-10-CM

## 2017-02-07 DIAGNOSIS — Z87891 Personal history of nicotine dependence: Secondary | ICD-10-CM | POA: Diagnosis not present

## 2017-02-07 DIAGNOSIS — Z1630 Resistance to unspecified antimicrobial drugs: Secondary | ICD-10-CM

## 2017-02-07 DIAGNOSIS — J189 Pneumonia, unspecified organism: Principal | ICD-10-CM | POA: Diagnosis present

## 2017-02-07 DIAGNOSIS — R531 Weakness: Secondary | ICD-10-CM

## 2017-02-07 DIAGNOSIS — M79662 Pain in left lower leg: Secondary | ICD-10-CM | POA: Diagnosis not present

## 2017-02-07 DIAGNOSIS — R0602 Shortness of breath: Secondary | ICD-10-CM | POA: Diagnosis not present

## 2017-02-07 DIAGNOSIS — J432 Centrilobular emphysema: Secondary | ICD-10-CM | POA: Diagnosis present

## 2017-02-07 DIAGNOSIS — J449 Chronic obstructive pulmonary disease, unspecified: Secondary | ICD-10-CM | POA: Diagnosis not present

## 2017-02-07 DIAGNOSIS — R0600 Dyspnea, unspecified: Secondary | ICD-10-CM

## 2017-02-07 DIAGNOSIS — Z7951 Long term (current) use of inhaled steroids: Secondary | ICD-10-CM | POA: Diagnosis not present

## 2017-02-07 DIAGNOSIS — I10 Essential (primary) hypertension: Secondary | ICD-10-CM

## 2017-02-07 DIAGNOSIS — Z79899 Other long term (current) drug therapy: Secondary | ICD-10-CM

## 2017-02-07 DIAGNOSIS — K5900 Constipation, unspecified: Secondary | ICD-10-CM | POA: Diagnosis present

## 2017-02-07 DIAGNOSIS — R918 Other nonspecific abnormal finding of lung field: Secondary | ICD-10-CM

## 2017-02-07 DIAGNOSIS — A499 Bacterial infection, unspecified: Secondary | ICD-10-CM

## 2017-02-07 DIAGNOSIS — Z1620 Resistance to unspecified antibiotic: Secondary | ICD-10-CM | POA: Diagnosis not present

## 2017-02-07 DIAGNOSIS — J181 Lobar pneumonia, unspecified organism: Secondary | ICD-10-CM

## 2017-02-07 HISTORY — DX: Unspecified asthma, uncomplicated: J45.909

## 2017-02-07 LAB — CBC
HEMATOCRIT: 40.8 % (ref 35.0–47.0)
Hemoglobin: 13.5 g/dL (ref 12.0–16.0)
MCH: 29.5 pg (ref 26.0–34.0)
MCHC: 33 g/dL (ref 32.0–36.0)
MCV: 89.3 fL (ref 80.0–100.0)
PLATELETS: 260 10*3/uL (ref 150–440)
RBC: 4.57 MIL/uL (ref 3.80–5.20)
RDW: 14.3 % (ref 11.5–14.5)
WBC: 7.7 10*3/uL (ref 3.6–11.0)

## 2017-02-07 LAB — BASIC METABOLIC PANEL
ANION GAP: 8 (ref 5–15)
BUN: 16 mg/dL (ref 6–20)
CHLORIDE: 105 mmol/L (ref 101–111)
CO2: 26 mmol/L (ref 22–32)
Calcium: 9.5 mg/dL (ref 8.9–10.3)
Creatinine, Ser: 0.81 mg/dL (ref 0.44–1.00)
GFR calc Af Amer: >60 mL/min (ref >60)
GFR calc non Af Amer: >60 mL/min (ref >60)
GLUCOSE: 118 mg/dL — AB (ref 65–99)
POTASSIUM: 4 mmol/L (ref 3.5–5.1)
Sodium: 139 mmol/L (ref 135–145)

## 2017-02-07 LAB — GLUCOSE, CAPILLARY: Glucose-Capillary: 95 mg/dL (ref 65–99)

## 2017-02-07 LAB — TROPONIN I: Troponin I: <0.03 ng/mL (ref <0.03)

## 2017-02-07 LAB — CK: Total CK: 271 U/L — ABNORMAL HIGH (ref 38–234)

## 2017-02-07 LAB — MRSA PCR SCREENING: MRSA BY PCR: NEGATIVE

## 2017-02-07 LAB — PROCALCITONIN

## 2017-02-07 MED ORDER — LOSARTAN POTASSIUM 50 MG PO TABS
100.0000 mg | ORAL_TABLET | Freq: Every day | ORAL | Status: DC
  Administered 2017-02-08: 08:00:00 100 mg via ORAL
  Filled 2017-02-07: qty 2

## 2017-02-07 MED ORDER — ACETAMINOPHEN 325 MG PO TABS
650.0000 mg | ORAL_TABLET | Freq: Four times a day (QID) | ORAL | Status: DC | PRN
  Administered 2017-02-08: 650 mg via ORAL
  Filled 2017-02-07: qty 2

## 2017-02-07 MED ORDER — ALBUTEROL SULFATE HFA 108 (90 BASE) MCG/ACT IN AERS: 2.0000 | INHALATION_SPRAY | RESPIRATORY_TRACT | Status: DC | PRN

## 2017-02-07 MED ORDER — LEVOFLOXACIN IN D5W 250 MG/50ML IV SOLN
250.0000 mg | INTRAVENOUS | Status: DC
  Administered 2017-02-08: 15:00:00 250 mg via INTRAVENOUS
  Filled 2017-02-07 (×2): qty 50

## 2017-02-07 MED ORDER — LEVOFLOXACIN IN D5W 750 MG/150ML IV SOLN
750.0000 mg | Freq: Once | INTRAVENOUS | Status: AC
  Administered 2017-02-07: 750 mg via INTRAVENOUS
  Filled 2017-02-07: qty 150

## 2017-02-07 MED ORDER — GUAIFENESIN-DM 100-10 MG/5ML PO SYRP
5.0000 mL | ORAL_SOLUTION | ORAL | Status: DC | PRN
  Filled 2017-02-07: qty 5

## 2017-02-07 MED ORDER — IOPAMIDOL (ISOVUE-370) INJECTION 76%
75.0000 mL | Freq: Once | INTRAVENOUS | Status: AC | PRN
  Administered 2017-02-07: 75 mL via INTRAVENOUS

## 2017-02-07 MED ORDER — IPRATROPIUM-ALBUTEROL 0.5-2.5 (3) MG/3ML IN SOLN
3.0000 mL | Freq: Four times a day (QID) | RESPIRATORY_TRACT | Status: DC
  Administered 2017-02-07: 3 mL via RESPIRATORY_TRACT

## 2017-02-07 MED ORDER — ENOXAPARIN SODIUM 40 MG/0.4ML ~~LOC~~ SOLN
40.0000 mg | SUBCUTANEOUS | Status: DC
  Filled 2017-02-07: qty 0.4

## 2017-02-07 MED ORDER — IOPAMIDOL (ISOVUE-300) INJECTION 61%: 75.0000 mL | Freq: Once | INTRAVENOUS | Status: DC | PRN

## 2017-02-07 MED ORDER — DOCUSATE SODIUM 100 MG PO CAPS
100.0000 mg | ORAL_CAPSULE | Freq: Every day | ORAL | Status: DC
  Administered 2017-02-07 – 2017-02-08 (×2): 100 mg via ORAL
  Filled 2017-02-07 (×2): qty 1

## 2017-02-07 MED ORDER — ONDANSETRON HCL 4 MG PO TABS
4.0000 mg | ORAL_TABLET | Freq: Four times a day (QID) | ORAL | Status: DC | PRN
Start: 2017-02-07 — End: 2017-02-08

## 2017-02-07 MED ORDER — VANCOMYCIN HCL IN DEXTROSE 750-5 MG/150ML-% IV SOLN
750.0000 mg | INTRAVENOUS | Status: DC
  Administered 2017-02-07: 750 mg via INTRAVENOUS
  Filled 2017-02-07 (×3): qty 150

## 2017-02-07 MED ORDER — MOMETASONE FURO-FORMOTEROL FUM 200-5 MCG/ACT IN AERO
2.0000 | INHALATION_SPRAY | Freq: Two times a day (BID) | RESPIRATORY_TRACT | Status: DC
  Administered 2017-02-07 – 2017-02-08 (×2): 2 via RESPIRATORY_TRACT
  Filled 2017-02-07: qty 8.8

## 2017-02-07 MED ORDER — SENNOSIDES-DOCUSATE SODIUM 8.6-50 MG PO TABS: 1.0000 | ORAL_TABLET | Freq: Every evening | ORAL | Status: DC | PRN

## 2017-02-07 MED ORDER — ALBUTEROL SULFATE (2.5 MG/3ML) 0.083% IN NEBU
2.5000 mg | INHALATION_SOLUTION | RESPIRATORY_TRACT | Status: DC | PRN
  Administered 2017-02-07 – 2017-02-08 (×3): 2.5 mg via RESPIRATORY_TRACT
  Filled 2017-02-07 (×3): qty 3

## 2017-02-07 MED ORDER — VANCOMYCIN HCL IN DEXTROSE 1-5 GM/200ML-% IV SOLN
1000.0000 mg | Freq: Once | INTRAVENOUS | Status: AC
  Administered 2017-02-07: 1000 mg via INTRAVENOUS
  Filled 2017-02-07: qty 200

## 2017-02-07 MED ORDER — ONDANSETRON HCL 4 MG/2ML IJ SOLN: 4.0000 mg | Freq: Four times a day (QID) | INTRAMUSCULAR | Status: DC | PRN

## 2017-02-07 MED ORDER — ACETAMINOPHEN 650 MG RE SUPP: 650.0000 mg | Freq: Four times a day (QID) | RECTAL | Status: DC | PRN

## 2017-02-07 MED ORDER — SODIUM CHLORIDE 0.9 % IV SOLN
Freq: Once | INTRAVENOUS | Status: AC
  Administered 2017-02-07: 16:00:00 via INTRAVENOUS

## 2017-02-07 MED ORDER — BUDESONIDE 3 MG PO CPEP
9.0000 mg | ORAL_CAPSULE | ORAL | Status: DC
  Administered 2017-02-08: 9 mg via ORAL
  Filled 2017-02-07: qty 3

## 2017-02-07 NOTE — ED Notes (Signed)
Patient transported to X-ray 

## 2017-02-07 NOTE — H&P (Signed)
[facility] [place] NAME: Paige Hart    MR#:  [id number]  DATE OF BIRTH:  Jul 18, 1935  DATE OF ADMISSION:  02/07/2017  PRIMARY CARE PHYSICIAN: Glean Hess, MD   REQUESTING/REFERRING PHYSICIAN: Dr Jacqualine Code  CHIEF COMPLAINT:   Increasing weakness and not feeling overall well.  Patient is here in the emergency room with her son  HISTORY OF PRESENT ILLNESS:  Paige Hart  is a 82 y.o. female with a known history of COPD with bullous emphysema history of smoking in the past, hypertension, comes to the emergency room with her son complains of generalized weakness and " by leg gave out" Patient was recently admitted 01/22/2017 discharge 01/25/2017 with right middle lobe pneumonia. She finish up antibiotic followed up with her primary care physician Dr. Asencion Noble who repeated a chest x-ray did look improved from previous one. In the emergency room patient was found to be short of breath with some hypoxia and a CT angiogram was done. CT) shows right middle lobe consolidation with lymphadenopathy possible mucus plugging and a spiculated nodule in the posterior part of the lung. She received a dose of IV vancomycin and Levaquin. Pulmonary was consulted and spoke with Dr. Rosita Fire and recommend continue IV antibiotics and we'll schedule a bronchoscopy for further evaluation to rule out underlying mass  given history of smoking versus mucus plugging of the bronchus.  PAST MEDICAL HISTORY:   Past Medical History:  Diagnosis Date  . Asthma   . Colitis    left sided  . Cystocele   . Emphysema/COPD ([place])   . Hypertension   . Procidentia of uterus   . Thyroid disease   . Urge incontinence   . Vaginal atrophy   . Wears dentures    full upper and lower    PAST SURGICAL HISTOIRY:   Past Surgical History:  Procedure Laterality Date  . COLONOSCOPY WITH PROPOFOL N/A 08/11/2016   Procedure: COLONOSCOPY WITH PROPOFOL;  Surgeon: [name],  MD;  Location: [place];  Service: Endoscopy;  Laterality: N/A;  . OOPHORECTOMY Left 1970  . POLYPECTOMY  08/11/2016   Procedure: POLYPECTOMY;  Surgeon: [name], MD;  Location: [place];  Service: Endoscopy;;    SOCIAL HISTORY:   Social History  Substance Use Topics  . Smoking status: Former Smoker    Quit date: 07/28/2004  . Smokeless tobacco: Never Used  . Alcohol use No    FAMILY HISTORY:   Family History  Problem Relation Age of Onset  . Ovarian cancer Mother   . Lung cancer Father   . Mental illness Sister   . Diabetes Neg Hx   . Heart disease Neg Hx   . Breast cancer Neg Hx   . Colon cancer Neg Hx     DRUG ALLERGIES:  No Known Allergies  REVIEW OF SYSTEMS:  Review of Systems  Constitutional: Negative for chills, fever and weight loss.  HENT: Negative for ear discharge, ear pain and nosebleeds.   Eyes: Negative for blurred vision, pain and discharge.  Respiratory: Positive for shortness of breath. Negative for sputum production, wheezing and stridor.   Cardiovascular: Negative for chest pain, palpitations, orthopnea and PND.  Gastrointestinal: Negative for abdominal pain, diarrhea, nausea and vomiting.  Genitourinary: Negative for frequency and urgency.  Musculoskeletal: Negative for back pain and joint pain.  Neurological: Positive for weakness. Negative for sensory change, speech change and focal weakness.  Psychiatric/Behavioral: Negative for depression and hallucinations. The patient  is not nervous/anxious.      MEDICATIONS AT HOME:   Prior to Admission medications   Medication Sig Start Date End Date Taking? Authorizing Provider  albuterol (PROVENTIL HFA;VENTOLIN HFA) 108 (90 Base) MCG/ACT inhaler Inhale 2 puffs into the lungs every 4 (four) hours as needed for wheezing or shortness of breath. 03/27/16  Yes Glean Hess, MD  budesonide (ENTOCORT EC) 3 MG 24 hr capsule Take 3 capsules (9 mg total) by mouth every morning. 02/05/17   Yes [name], MD  budesonide-formoterol [facility]) 160-4.5 MCG/ACT inhaler Inhale 2 puffs into the lungs 2 (two) times daily. 03/27/16  Yes Glean Hess, MD  docusate sodium (COLACE) 100 MG capsule Take 1 capsule (100 mg total) by mouth every other day. Patient taking differently: Take 100 mg by mouth daily.  07/07/16 07/07/17 Yes Harvest Dark, MD  ESTRACE VAGINAL 0.1 MG/GM vaginal cream INSERT 0.5GRAMS VAGINALLY ONCE WEEKLY Patient taking differently: INSERT 0.5GRAMS VAGINALLY twice weekly on monday and friday 08/07/15  Yes Defrancesco, Alanda Slim, MD  guaiFENesin-dextromethorphan (ROBITUSSIN DM) 100-10 MG/5ML syrup Take 5 mLs by mouth every 4 (four) hours as needed for cough. 01/25/17  Yes Dustin Flock, MD  losartan (COZAAR) 100 MG tablet Take 1 tablet (100 mg total) by mouth daily. 07/03/16  Yes Glean Hess, MD  MULTIPLE VITAMINS-MINERALS PO Take 1 tablet by mouth daily.   Yes [provider]      VITAL SIGNS:  Blood pressure 130/71, pulse 83, temperature 98 F (36.7 C), resp. rate 18, weight 64.4 kg (142 lb), SpO2 93 %.  PHYSICAL EXAMINATION:  GENERAL:  82 y.o.-year-old patient lying in the bed with no acute distress.  EYES: Pupils equal, round, reactive to light and accommodation. No scleral icterus. Extraocular muscles intact.  HEENT: Head atraumatic, normocephalic. Oropharynx and nasopharynx clear.  NECK:  Supple, no jugular venous distention. No thyroid enlargement, no tenderness.  LUNGS: Normal breath sounds bilaterally, no wheezing, rales,rhonchi or crepitation. No use of accessory muscles of respiration.  CARDIOVASCULAR: S1, S2 normal. No murmurs, rubs, or gallops.  ABDOMEN: Soft, nontender, nondistended. Bowel sounds present. No organomegaly or mass.  EXTREMITIES: No pedal edema, cyanosis, or clubbing.  NEUROLOGIC: Cranial nerves II through XII are intact. Muscle strength 5/5 in all extremities. Sensation intact. Gait not checked.  PSYCHIATRIC: The  patient is alert and oriented x 3.  SKIN: No obvious rash, lesion, or ulcer.   LABORATORY PANEL:   CBC  Recent Labs Lab 02/07/17 1023  WBC 7.7  HGB 13.5  HCT 40.8  PLT 260   ------------------------------------------------------------------------------------------------------------------  Chemistries   Recent Labs Lab 02/07/17 1023  NA 139  K 4.0  CL 105  CO2 26  GLUCOSE 118*  BUN 16  CREATININE 0.81  CALCIUM 9.5   ------------------------------------------------------------------------------------------------------------------  Cardiac Enzymes  Recent Labs Lab 02/07/17 1023  TROPONINI <0.03   ------------------------------------------------------------------------------------------------------------------  RADIOLOGY:  Dg Chest 2 View  Result Date: 02/07/2017 CLINICAL DATA:  Pt with complaints of shortness of breath and weakness. Reports she was recently treated inpatient for left upper pneumonia the very end of June, had a follow-up chest x-ray this past week that showed improving pneumonia. Son reports that patient's shortness of breath acutely returned this past Wednesday or Thursday morning. Patient reports some intermittent wheezing, last use inhaler at 6 AM. Reports feels shaky and generalized weak. Decreased appetite, continues to drink fluids well. Does further report has some left posterior calf pain that is atypical for her and different. Denies known fevers. Former  smoker. EXAM: CHEST  2 VIEW COMPARISON:  02/02/2017 FINDINGS: The area of opacity in the central left upper lobe has mildly increased from the prior study. Remainder of the lungs is clear. Lungs are hyperexpanded. No pleural effusion.  No pneumothorax. Cardiac silhouette is normal in size. No mediastinal masses or convincing adenopathy. Left superior hilum is obscured by the contiguous left upper lobe opacity. Right hilum is unremarkable. Skeletal structures are demineralized but intact. IMPRESSION:  1. Left upper lobe opacity has mildly increased since the most recent prior exam. Although this remains consistent with pneumonia, an underlying mass should be considered. Recommend followup chest CT with contrast for further assessment. 2. COPD. Electronically Signed   By: Lajean Manes M.D.   On: 02/07/2017 10:43   Ct Angio Chest Pe W Or Wo Contrast  Result Date: 02/07/2017 CLINICAL DATA:  Shortness of breath. EXAM: CT ANGIOGRAPHY CHEST WITH CONTRAST TECHNIQUE: Multidetector CT imaging of the chest was performed using the standard protocol during bolus administration of intravenous contrast. Multiplanar CT image reconstructions and MIPs were obtained to evaluate the vascular anatomy. CONTRAST:  75 cc Isovue 370 COMPARISON:  Chest radiographs obtained earlier today and previously. FINDINGS: Cardiovascular: Normally opacified pulmonary arteries with no pulmonary arterial filling defects. Aortic calcifications. Mediastinum/Nodes: Mildly enlarged thyroid gland containing multiple calcified and noncalcified nodules. The largest nodules on the left, measuring 1.7 cm in maximum diameter on image number 9 of series 4. Enlarged mediastinal and left hilar lymph nodes. The largest noted is an AP window node with a short axis diameter of 2.3 cm on image number 32 of series 4. The largest left hilar node has a short axis diameter of 1.6 cm on image number 42 of series 4. Lungs/Pleura: Dense consolidation in the central left upper lobe containing normally opacified pulmonary arteries. The area of consolidation measures 7.6 x 6.9 cm on image number 31 of series 6. There is a gradually occluded bronchus extending into this area containing patchy filling defects. There is patchy opacity in the majority of the remainder of the left upper lobe peripheral to this dense consolidation. Also demonstrated is a 1.1 x 1.0 cm rounded, mildly irregular and spiculated nodule in the posteromedial aspect of the right upper lobe on image  number 34 of series 6. Mild bullous changes in both lungs.  No pleural fluid. Upper Abdomen: Unremarkable.  Normal appearing adrenal glands. Musculoskeletal: Thoracic spine degenerative changes. Review of the MIP images confirms the above findings. IMPRESSION: 1. 7.6 x 6.9 cm rounded area of dense, confluent consolidation in the left upper lobe with less pronounced patchy opacity distal to this area in the majority of the left upper lobe. This is compatible with dense, consolidative pneumonia and less dense pneumonia more distally in the left upper lobe. An underlying mass with peripheral postobstructive changes cannot be excluded. 2. 1.1 x 1.0 cm mildly irregular and spiculated nodule in the right upper lobe. This has CT features suspicious for a primary lung carcinoma. 3. Mediastinal and left hilar adenopathy. This is most likely reactive adenopathy associated with left upper lobe pneumonia. If there is an underlying neoplasm, this could represent metastatic adenopathy. 4. COPD with centrilobular emphysema. 5. Aortic atherosclerosis. 6. Multinodular thyroid goiter. The largest nodule measures 1.7 cm. Consider further evaluation with thyroid ultrasound. If patient is clinically hyperthyroid, consider nuclear medicine thyroid uptake and scan. Aortic Atherosclerosis (ICD10-I70.0) and Emphysema (ICD10-J43.9). Electronically Signed   By: Claudie Revering M.D.   On: 02/07/2017 13:11   US Venous Img  Lower Unilateral Left  Result Date: 02/07/2017 CLINICAL DATA:  Left lower extremity pain. EXAM: LEFT LOWER EXTREMITY VENOUS DOPPLER ULTRASOUND TECHNIQUE: Gray-scale sonography with graded compression, as well as color Doppler and duplex ultrasound were performed to evaluate the lower extremity deep venous systems from the level of the common femoral vein and including the common femoral, femoral, profunda femoral, popliteal and calf veins including the posterior tibial, peroneal and gastrocnemius veins when visible. The  superficial great saphenous vein was also interrogated. Spectral Doppler was utilized to evaluate flow at rest and with distal augmentation maneuvers in the common femoral, femoral and popliteal veins. COMPARISON:  None. FINDINGS: Contralateral Common Femoral Vein: Respiratory phasicity is normal and symmetric with the symptomatic side. No evidence of thrombus. Normal compressibility. Common Femoral Vein: No evidence of thrombus. Normal compressibility, respiratory phasicity and response to augmentation. Saphenofemoral Junction: No evidence of thrombus. Normal compressibility and flow on color Doppler imaging. Profunda Femoral Vein: No evidence of thrombus. Normal compressibility and flow on color Doppler imaging. Femoral Vein: No evidence of thrombus. Normal compressibility, respiratory phasicity and response to augmentation. Popliteal Vein: No evidence of thrombus. Normal compressibility, respiratory phasicity and response to augmentation. Calf Veins: No evidence of thrombus. Normal compressibility and flow on color Doppler imaging. Superficial Great Saphenous Vein: No evidence of thrombus. Normal compressibility and flow on color Doppler imaging. Venous Reflux:  None. Other Findings:  None. IMPRESSION: No evidence of DVT within the left lower extremity. Electronically Signed   By: Rolm Baptise M.D.   On: 02/07/2017 12:35    EKG:    IMPRESSION AND PLAN:  Tallie Dodds  is a 82 y.o. female with a known history of COPD with bullous emphysema history of smoking in the past, hypertension, comes to the emergency room with her son complains of generalized weakness and " by leg gave out" Patient was recently admitted 01/22/2017 discharge 01/25/2017 with right middle lobe pneumonia.   1. Right middle lobe consolidation versus mucus plugging of the bronchus versus questionable mass -Admit to medical floor -IV Levaquin and vancomycin -Pulmonary consultation by Dr. Rosita Fire case discussed. Plans for bronchoscopy  next week -Quantiferon TB test, Legionella antigen  2. History of COPD not on home oxygen. -Continue nebulizers as needed and oral inhalers  3. Hypertension - continue losartan  4. DVT prophylaxis on Lovenox  5. Bilateral generalized weakness lower extremity with pain.  -Ultrasound negative for DVT.  All the records are reviewed and case discussed with ED provider. Management plans discussed with the patient, family and they are in agreement.  CODE STATUS: full  TOTAL TIME TAKING CARE OF THIS PATIENT: 45 minutes.    Chevonne Bostrom M.D on 02/07/2017 at 3:34 PM  Between 7am to 6pm - Pager - [phone]  After 6pm go to [email or web address] - password [email or web address] [facility]  [facility]  Office  [phone]  CC: Primary care physician; Glean Hess, MD

## 2017-02-07 NOTE — ED Triage Notes (Signed)
Pt was seen in the ED on 01/22/2017 for CAP of LUL. She was treated with azithromycin. Pt reports her sx are not improving, and now she is experiencing shakes, myalgias.

## 2017-02-07 NOTE — ED Notes (Signed)
Patient transported to CT 

## 2017-02-07 NOTE — Discharge Instructions (Signed)
Go directly to emergency room as discussed.  °

## 2017-02-07 NOTE — ED Provider Notes (Signed)
[facility] ____________________________________________  Time seen: Approximately 8:41 AM  I have reviewed the triage vital signs and the nursing notes.   HISTORY  Chief Complaint Shortness of Breath and myalgias  HPI Paige Hart is a 82 y.o. female presenting with son at bedside for complaints of shortness of breath and weakness. Reports she was recently treated inpatient for left upper pneumonia the very end of June, had a follow-up chest x-ray this past week that showed improving pneumonia. Son reports that patient's shortness of breath acutely returned this past Wednesday or Thursday morning. Patient reports some intermittent wheezing, last use inhaler at 6 AM. Reports feels shaky and generalized weak. Decreased appetite, continues to drink fluids well. Does further report has some left posterior calf pain that is atypical for her and different. Denies known fevers. For some nausea, no vomiting or diarrhea. Some constipation. Awaiting pulmonology follow-up appointment. Also does have a history of COPD and emphysema. No other alleviating measures taken prior to arrival. Does reports having difficulty walking more than a few feet because of weakness and shortness of breath. Denies other aggravating or alleviating factors. Denies any cardiac history. Denies associated chest pain or pain with deep breath or abdominal pain.  Glean Hess, MD: PCP   Past Medical History:  Diagnosis Date  . Colitis    left sided  . Cystocele   . Emphysema/COPD ([place])   . Hypertension   . Procidentia of uterus   . Thyroid disease   . Urge incontinence   . Vaginal atrophy   . Wears dentures    full upper and lower    Patient Active Problem List   Diagnosis Date Noted  . Pneumonia of left upper lobe due to infectious organism ([place]) 01/22/2017  . Ulcerative colitis ([place]) 09/02/2016  . Blood in stool   . Rectal polyp   . Polyp of sigmoid colon   . Benign neoplasm of descending  colon   . Benign neoplasm of transverse colon   . Benign neoplasm of cecum   . BRBPR (bright red blood per rectum) 08/05/2016  . Chronic idiopathic constipation 07/09/2016  . Acute cystitis with hematuria 07/09/2016  . Bleeding hemorrhoid 07/03/2016  . Cystocele 12/27/2015  . Uterine procidentia 12/27/2015  . Pessary maintenance 03/22/2015  . Allergic conjunctivitis 11/17/2014  . Allergic rhinitis 11/17/2014  . Essential (primary) hypertension 11/17/2014  . Graves disease 11/17/2014  . Bursitis of hip 11/17/2014  . Combined fat and carbohydrate induced hyperlipemia 11/17/2014  . Chronic obstructive pulmonary emphysema ([place]) 11/17/2014  . Pelvic relaxation due to uterovaginal prolapse - pessary use 11/17/2014    Past Surgical History:  Procedure Laterality Date  . COLONOSCOPY WITH PROPOFOL N/A 08/11/2016   Procedure: COLONOSCOPY WITH PROPOFOL;  Surgeon: [name], MD;  Location: [place];  Service: Endoscopy;  Laterality: N/A;  . OOPHORECTOMY Left 1970  . POLYPECTOMY  08/11/2016   Procedure: POLYPECTOMY;  Surgeon: [name], MD;  Location: [place];  Service: Endoscopy;;      Current Facility-Administered Medications:  .  ipratropium-albuterol (DUONEB) 0.5-2.5 (3) MG/3ML nebulizer solution 3 mL, 3 mL, Nebulization, Q6H, Marylene Land, NP, 3 mL at 02/07/17 0845  Current Outpatient Prescriptions:  .  albuterol (PROVENTIL HFA;VENTOLIN HFA) 108 (90 Base) MCG/ACT inhaler, Inhale 2 puffs into the lungs every 4 (four) hours as needed for wheezing or shortness of breath., Disp: 1 Inhaler, Rfl: 12 .  budesonide (ENTOCORT EC) 3 MG 24 hr capsule, Take 3 capsules (9 mg total) by  mouth every morning., Disp: 90 capsule, Rfl: 1 .  budesonide-formoterol (SYMBICORT) 160-4.5 MCG/ACT inhaler, Inhale 2 puffs into the lungs 2 (two) times daily., Disp: 10.2 g, Rfl: 12 .  losartan (COZAAR) 100 MG tablet, Take 1 tablet (100 mg total) by mouth daily., Disp: 90 tablet, Rfl: 3 .   docusate sodium (COLACE) 100 MG capsule, Take 1 capsule (100 mg total) by mouth every other day. (Patient taking differently: Take 100 mg by mouth daily. ), Disp: 30 capsule, Rfl: 2 .  ESTRACE VAGINAL 0.1 MG/GM vaginal cream, INSERT 0.5GRAMS VAGINALLY ONCE WEEKLY (Patient taking differently: INSERT 0.5GRAMS VAGINALLY ONCE WEEKLY (Mondays)), Disp: 45 g, Rfl: 1 .  guaiFENesin-dextromethorphan (ROBITUSSIN DM) 100-10 MG/5ML syrup, Take 5 mLs by mouth every 4 (four) hours as needed for cough., Disp: 118 mL, Rfl: 0 .  MULTIPLE VITAMINS-MINERALS PO, Take 1 tablet by mouth daily., Disp: , Rfl:   Allergies Patient has no known allergies.  Family History  Problem Relation Age of Onset  . Ovarian cancer Mother   . Lung cancer Father   . Mental illness Sister   . Diabetes Neg Hx   . Heart disease Neg Hx   . Breast cancer Neg Hx   . Colon cancer Neg Hx     Social History Social History  Substance Use Topics  . Smoking status: Former Smoker    Quit date: 07/28/2004  . Smokeless tobacco: Never Used  . Alcohol use No    Review of Systems Constitutional: No fever/chills Eyes: No visual changes. ENT: No sore throat. Cardiovascular: Denies chest pain. Respiratory: positive shortness of breath. Gastrointestinal: No abdominal pain.  Genitourinary: Negative for dysuria. Musculoskeletal: Negative for back pain. Skin: Negative for rash.  ____________________________________________   PHYSICAL EXAM:  VITAL SIGNS: ED Triage Vitals  Enc Vitals Group     BP 02/07/17 0812 (!) 176/76     Pulse Rate 02/07/17 0812 78     Resp -- 26     Temp 02/07/17 0812 98.1 F (36.7 C)     Temp Source 02/07/17 0812 Oral     SpO2 02/07/17 0812 96 %     Weight --      Height --      Head Circumference --      Peak Flow --      Pain Score 02/07/17 0816 6     Pain Loc --      Pain Edu? --      Excl. in [place]? --     Constitutional: Alert and oriented. Well appearing and in no acute distress. Cardiovascular:  Normal rate, regular rhythm. Grossly normal heart sounds.  Good peripheral circulation. Respiratory: Increased respiratory rate. Mild scattered wheezing, no focal consolidation auscultated.  Gastrointestinal: Soft and nontender. Musculoskeletal: Left lower posterior calf mild tenderness to palpation. Bilateral pedal pulses equal and easily palpated. Neurologic:  Normal speech and language. No gross focal neurologic deficits are appreciated. Speech is normal. No gait instability.  Skin:  Skin is warm, dry and intact. No rash noted. Psychiatric: Mood and affect are normal. Speech and behavior are normal. Patient exhibits appropriate insight and judgment   ___________________________________________   LABS (all labs ordered are listed, but only abnormal results are displayed)  Labs Reviewed  GLUCOSE, CAPILLARY  CBG MONITORING, ED   ____________________________________________  EKG  ED ECG REPORT I, Marylene Land, the attending physician, personally viewed and interpreted this ECG.   Date: 02/07/2017  EKG Time: 0842  Rate: 77  Rhythm: normal sinus rhythm  Axis: none  Intervals:normal   ST&T Change: no ST or T wave depression or elevation noted.   RADIOLOGY  No results found. ____________________________________________   PROCEDURES Procedures   INITIAL IMPRESSION / ASSESSMENT AND PLAN / ED COURSE  Pertinent labs & imaging results that were available during my care of the patient were reviewed by me and considered in my medical decision making (see chart for details).  Well-appearing patient. Son at bedside. Presenting for increased shortness of breath over the last 3 days. Patient with recent hospitalization for left upper lobe pneumonia. Mild wheezing noted, one duoneb given in urgent care with improvement but continues with shortness of breath. Discussed with patient with patient concern for reevaluation of pneumonia, versus pulmonary emboli versus COPD exacerbation.  Encouraged further evaluation and treatment in emergency room at this time. Patient alert and oriented with decisional capacity and states that her son will take her to the ER, declines EMS. States he was at [facility]. Maudie Mercury RN triage nurse at [facility] called and given report. Patient stable at time of discharge.  ____________________________________________   FINAL CLINICAL IMPRESSION(S) / ED DIAGNOSES  Final diagnoses:  SOB (shortness of breath)     Discharge Medication List as of 02/07/2017  8:35 AM      Note: This dictation was prepared with Dragon dictation along with smaller phrase technology. Any transcriptional errors that result from this process are unintentional.         Marylene Land, NP 02/07/17 [phone]

## 2017-02-07 NOTE — ED Triage Notes (Signed)
Pt to ED via POV from [place] urgent care for Shortness of breath. On triage, pt states that she is also having weakness in her left leg that started yesterday and joint aches. Pt reports that she was "stumbling around". Pt does not appear to be in any distress at this time. Pt able to speak in complete sentences.

## 2017-02-07 NOTE — ED Provider Notes (Signed)
[facility] Emergency Department Provider Note   ____________________________________________   First MD Initiated Contact with Patient 02/07/17 1023     (approximate)  I have reviewed the triage vital signs and the nursing notes.   HISTORY  Chief Complaint Shortness of Breath    HPI Paige Hart is a 82 y.o. female recently treated for community acquired pneumonia but with resistance requiring hospitalization  Patient reports that the last day she began experiencing increased feeling of shortness of breath. Denies cough or fever. She has been rinsing some muscle aches in her hands and also discomfort in the back of the left calf. Denies pain.   Came to urgent care for concerns of increasing shortness of breath. Reports completing entire antibiotic course.  Recent pneumonia. History of smoking in the past. Reports feeling shortness breath has improved slightly after breathing treatment urgent care   Past Medical History:  Diagnosis Date  . Asthma   . Colitis    left sided  . Cystocele   . Emphysema/COPD ([place])   . Hypertension   . Procidentia of uterus   . Thyroid disease   . Urge incontinence   . Vaginal atrophy   . Wears dentures    full upper and lower    Patient Active Problem List   Diagnosis Date Noted  . Pneumonia of left upper lobe due to infectious organism ([place]) 01/22/2017  . Ulcerative colitis ([place]) 09/02/2016  . Blood in stool   . Rectal polyp   . Polyp of sigmoid colon   . Benign neoplasm of descending colon   . Benign neoplasm of transverse colon   . Benign neoplasm of cecum   . BRBPR (bright red blood per rectum) 08/05/2016  . Chronic idiopathic constipation 07/09/2016  . Acute cystitis with hematuria 07/09/2016  . Bleeding hemorrhoid 07/03/2016  . Cystocele 12/27/2015  . Uterine procidentia 12/27/2015  . Pessary maintenance 03/22/2015  . Allergic conjunctivitis 11/17/2014  . Allergic rhinitis 11/17/2014  .  Essential (primary) hypertension 11/17/2014  . Graves disease 11/17/2014  . Bursitis of hip 11/17/2014  . Combined fat and carbohydrate induced hyperlipemia 11/17/2014  . Chronic obstructive pulmonary emphysema ([place]) 11/17/2014  . Pelvic relaxation due to uterovaginal prolapse - pessary use 11/17/2014    Past Surgical History:  Procedure Laterality Date  . COLONOSCOPY WITH PROPOFOL N/A 08/11/2016   Procedure: COLONOSCOPY WITH PROPOFOL;  Surgeon: [name], MD;  Location: [place];  Service: Endoscopy;  Laterality: N/A;  . OOPHORECTOMY Left 1970  . POLYPECTOMY  08/11/2016   Procedure: POLYPECTOMY;  Surgeon: [name], MD;  Location: [place];  Service: Endoscopy;;    Prior to Admission medications   Medication Sig Start Date End Date Taking? Authorizing Provider  albuterol (PROVENTIL HFA;VENTOLIN HFA) 108 (90 Base) MCG/ACT inhaler Inhale 2 puffs into the lungs every 4 (four) hours as needed for wheezing or shortness of breath. 03/27/16  Yes Glean Hess, MD  budesonide (ENTOCORT EC) 3 MG 24 hr capsule Take 3 capsules (9 mg total) by mouth every morning. 02/05/17  Yes [name], MD  budesonide-formoterol [facility]) 160-4.5 MCG/ACT inhaler Inhale 2 puffs into the lungs 2 (two) times daily. 03/27/16  Yes Glean Hess, MD  docusate sodium (COLACE) 100 MG capsule Take 1 capsule (100 mg total) by mouth every other day. Patient taking differently: Take 100 mg by mouth daily.  07/07/16 07/07/17 Yes Harvest Dark, MD  ESTRACE VAGINAL 0.1 MG/GM vaginal cream INSERT 0.5GRAMS VAGINALLY ONCE WEEKLY Patient  taking differently: INSERT 0.5GRAMS VAGINALLY twice weekly on monday and friday 08/07/15  Yes Defrancesco, Alanda Slim, MD  guaiFENesin-dextromethorphan (ROBITUSSIN DM) 100-10 MG/5ML syrup Take 5 mLs by mouth every 4 (four) hours as needed for cough. 01/25/17  Yes Dustin Flock, MD  losartan (COZAAR) 100 MG tablet Take 1 tablet (100 mg total) by mouth daily. 07/03/16   Yes Glean Hess, MD  MULTIPLE VITAMINS-MINERALS PO Take 1 tablet by mouth daily.   Yes [provider]    Allergies Patient has no known allergies.  Family History  Problem Relation Age of Onset  . Ovarian cancer Mother   . Lung cancer Father   . Mental illness Sister   . Diabetes Neg Hx   . Heart disease Neg Hx   . Breast cancer Neg Hx   . Colon cancer Neg Hx     Social History Social History  Substance Use Topics  . Smoking status: Former Smoker    Quit date: 07/28/2004  . Smokeless tobacco: Never Used  . Alcohol use No    Review of Systems Constitutional: No fever/chills But having some joint aches to go in the forearms Eyes: No visual changes. ENT: No sore throat. Cardiovascular: Denies chest pain. Respiratory: Denies shortness of breath. Gastrointestinal: No abdominal pain.  No nausea, no vomiting.  No diarrhea.  No constipation. Genitourinary: Negative for dysuria. Musculoskeletal: Negative for back pain. Skin: Negative for rash. Neurological: Negative for headaches, focal weakness or numbness.    ____________________________________________   PHYSICAL EXAM:  VITAL SIGNS: ED Triage Vitals  Enc Vitals Group     BP 02/07/17 1018 (!) 148/80     Pulse Rate 02/07/17 1018 82     Resp 02/07/17 1018 20     Temp 02/07/17 1018 98 F (36.7 C)     Temp src --      SpO2 02/07/17 1018 94 %     Weight 02/07/17 1013 142 lb (64.4 kg)     Height --      Head Circumference --      Peak Flow --      Pain Score --      Pain Loc --      Pain Edu? --      Excl. in [place]? --     Constitutional: Alert and oriented. Well appearing and in no acute distress. Eyes: Conjunctivae are normal. Head: Atraumatic. Nose: No congestion/rhinnorhea. Mouth/Throat: Mucous membranes are moist. Neck: No stridor.   Cardiovascular: Normal rate, regular rhythm. Mild systolic ejection murmur. Good peripheral circulation. Respiratory: Normal respiratory effort.  No  retractions. Lungs CTAB. Gastrointestinal: Soft and nontender. No distention. Musculoskeletal: No lower extremity tenderness nor edema. Neurologic:  Normal speech and language. No gross focal neurologic deficits are appreciated.  Skin:  Skin is warm, dry and intact. No rash noted. Psychiatric: Mood and affect are normal. Speech and behavior are normal.  ____________________________________________   LABS (all labs ordered are listed, but only abnormal results are displayed)  Labs Reviewed  BASIC METABOLIC PANEL - Abnormal; Notable for the following:       Result Value   Glucose, Bld 118 (*)    All other components within normal limits  CK - Abnormal; Notable for the following:    Total CK 271 (*)    All other components within normal limits  CULTURE, EXPECTORATED SPUTUM-ASSESSMENT  MRSA PCR SCREENING  CULTURE, BLOOD (ROUTINE X 2)  CULTURE, BLOOD (ROUTINE X 2)  CBC  TROPONIN I  QUANTIFERON TB GOLD  ASSAY (BLOOD)  LEGIONELLA PNEUMOPHILA SEROGP 1 UR AG   ____________________________________________  EKG  Reviewed and interpreted by me at 11:15 Heart rate 78 QRS 90 QTc 4:30 Slight baseline wander, normal sinus rhythm without evidence of ischemia ____________________________________________  RADIOLOGY  Dg Chest 2 View  Result Date: 02/07/2017 CLINICAL DATA:  Pt with complaints of shortness of breath and weakness. Reports she was recently treated inpatient for left upper pneumonia the very end of June, had a follow-up chest x-ray this past week that showed improving pneumonia. Son reports that patient's shortness of breath acutely returned this past Wednesday or Thursday morning. Patient reports some intermittent wheezing, last use inhaler at 6 AM. Reports feels shaky and generalized weak. Decreased appetite, continues to drink fluids well. Does further report has some left posterior calf pain that is atypical for her and different. Denies known fevers. Former smoker. EXAM: CHEST   2 VIEW COMPARISON:  02/02/2017 FINDINGS: The area of opacity in the central left upper lobe has mildly increased from the prior study. Remainder of the lungs is clear. Lungs are hyperexpanded. No pleural effusion.  No pneumothorax. Cardiac silhouette is normal in size. No mediastinal masses or convincing adenopathy. Left superior hilum is obscured by the contiguous left upper lobe opacity. Right hilum is unremarkable. Skeletal structures are demineralized but intact. IMPRESSION: 1. Left upper lobe opacity has mildly increased since the most recent prior exam. Although this remains consistent with pneumonia, an underlying mass should be considered. Recommend followup chest CT with contrast for further assessment. 2. COPD. Electronically Signed   By: Lajean Manes M.D.   On: 02/07/2017 10:43   Ct Angio Chest Pe W Or Wo Contrast  Result Date: 02/07/2017 CLINICAL DATA:  Shortness of breath. EXAM: CT ANGIOGRAPHY CHEST WITH CONTRAST TECHNIQUE: Multidetector CT imaging of the chest was performed using the standard protocol during bolus administration of intravenous contrast. Multiplanar CT image reconstructions and MIPs were obtained to evaluate the vascular anatomy. CONTRAST:  75 cc Isovue 370 COMPARISON:  Chest radiographs obtained earlier today and previously. FINDINGS: Cardiovascular: Normally opacified pulmonary arteries with no pulmonary arterial filling defects. Aortic calcifications. Mediastinum/Nodes: Mildly enlarged thyroid gland containing multiple calcified and noncalcified nodules. The largest nodules on the left, measuring 1.7 cm in maximum diameter on image number 9 of series 4. Enlarged mediastinal and left hilar lymph nodes. The largest noted is an AP window node with a short axis diameter of 2.3 cm on image number 32 of series 4. The largest left hilar node has a short axis diameter of 1.6 cm on image number 42 of series 4. Lungs/Pleura: Dense consolidation in the central left upper lobe containing  normally opacified pulmonary arteries. The area of consolidation measures 7.6 x 6.9 cm on image number 31 of series 6. There is a gradually occluded bronchus extending into this area containing patchy filling defects. There is patchy opacity in the majority of the remainder of the left upper lobe peripheral to this dense consolidation. Also demonstrated is a 1.1 x 1.0 cm rounded, mildly irregular and spiculated nodule in the posteromedial aspect of the right upper lobe on image number 34 of series 6. Mild bullous changes in both lungs.  No pleural fluid. Upper Abdomen: Unremarkable.  Normal appearing adrenal glands. Musculoskeletal: Thoracic spine degenerative changes. Review of the MIP images confirms the above findings. IMPRESSION: 1. 7.6 x 6.9 cm rounded area of dense, confluent consolidation in the left upper lobe with less pronounced patchy opacity distal to this area in the  majority of the left upper lobe. This is compatible with dense, consolidative pneumonia and less dense pneumonia more distally in the left upper lobe. An underlying mass with peripheral postobstructive changes cannot be excluded. 2. 1.1 x 1.0 cm mildly irregular and spiculated nodule in the right upper lobe. This has CT features suspicious for a primary lung carcinoma. 3. Mediastinal and left hilar adenopathy. This is most likely reactive adenopathy associated with left upper lobe pneumonia. If there is an underlying neoplasm, this could represent metastatic adenopathy. 4. COPD with centrilobular emphysema. 5. Aortic atherosclerosis. 6. Multinodular thyroid goiter. The largest nodule measures 1.7 cm. Consider further evaluation with thyroid ultrasound. If patient is clinically hyperthyroid, consider nuclear medicine thyroid uptake and scan. Aortic Atherosclerosis (ICD10-I70.0) and Emphysema (ICD10-J43.9). Electronically Signed   By: Claudie Revering M.D.   On: 02/07/2017 13:11   US Venous Img Lower Unilateral Left  Result Date:  02/07/2017 CLINICAL DATA:  Left lower extremity pain. EXAM: LEFT LOWER EXTREMITY VENOUS DOPPLER ULTRASOUND TECHNIQUE: Gray-scale sonography with graded compression, as well as color Doppler and duplex ultrasound were performed to evaluate the lower extremity deep venous systems from the level of the common femoral vein and including the common femoral, femoral, profunda femoral, popliteal and calf veins including the posterior tibial, peroneal and gastrocnemius veins when visible. The superficial great saphenous vein was also interrogated. Spectral Doppler was utilized to evaluate flow at rest and with distal augmentation maneuvers in the common femoral, femoral and popliteal veins. COMPARISON:  None. FINDINGS: Contralateral Common Femoral Vein: Respiratory phasicity is normal and symmetric with the symptomatic side. No evidence of thrombus. Normal compressibility. Common Femoral Vein: No evidence of thrombus. Normal compressibility, respiratory phasicity and response to augmentation. Saphenofemoral Junction: No evidence of thrombus. Normal compressibility and flow on color Doppler imaging. Profunda Femoral Vein: No evidence of thrombus. Normal compressibility and flow on color Doppler imaging. Femoral Vein: No evidence of thrombus. Normal compressibility, respiratory phasicity and response to augmentation. Popliteal Vein: No evidence of thrombus. Normal compressibility, respiratory phasicity and response to augmentation. Calf Veins: No evidence of thrombus. Normal compressibility and flow on color Doppler imaging. Superficial Great Saphenous Vein: No evidence of thrombus. Normal compressibility and flow on color Doppler imaging. Venous Reflux:  None. Other Findings:  None. IMPRESSION: No evidence of DVT within the left lower extremity. Electronically Signed   By: Rolm Baptise M.D.   On: 02/07/2017 12:35    ____________________________________________   PROCEDURES  Procedure(s) performed:  None  Procedures  Critical Care performed: No  ____________________________________________   INITIAL IMPRESSION / ASSESSMENT AND PLAN / ED COURSE  Pertinent labs & imaging results that were available during my care of the patient were reviewed by me and considered in my medical decision making (see chart for details).    Clinical Course as of Feb 07 1333  Sat Feb 07, 2017  1322 Patient's care discussed with Dr. Alva Garnet of pulmonology who reviewed the history and images with me. He recommends starting patient on vancomycin and Levaquin, admission.   [MQ]    Clinical Course User Index [MQ] Delman Kitten, MD    ----------------------------------------- 1:34 PM on 02/07/2017 -----------------------------------------  Discussed results with patient. She remains  stable. Will admit for initiation of additional IV antibiotic for seemingly resistant infection with concern for possible lung mass also associated ____________________________________________   FINAL CLINICAL IMPRESSION(S) / ED DIAGNOSES  Final diagnoses:  Dyspnea  Mass of right lung  Antibiotic-resistant bacterial infection  Community acquired pneumonia of left upper lobe  of lung ([place])      NEW MEDICATIONS STARTED DURING THIS VISIT:  New Prescriptions   No medications on file     Note:  This document was prepared using Dragon voice recognition software and may include unintentional dictation errors.     Delman Kitten, MD 02/07/17 1334

## 2017-02-07 NOTE — Progress Notes (Signed)
Pharmacy Antibiotic Note  Paige Hart is a 82 y.o. female admitted on 02/07/2017 with  pneumonia.  Pharmacy has been consulted for Levofloxacin and vancomycin dosing. Patient received vancomycin 1g IV and Levofloxacin 77m IV x 1 dose in ED.   Plan: Ke: 0.050   T1/2: 13.86   Vd: 44.8     Will start patient on vancomycin 7531mIV every 18 hours with 6 hour stack dosing. Calculated trough at Css is 15. Trough ordered prior to 4th dose. Will monitor renal function and adjust dose as needed. MRSA PCR is ordered- if negative, recommend discontinuing vancomycin.   Will order levofloxacin 25033mvery 24 hours based on current CrCl <85m44mn.   Weight: 142 lb (64.4 kg)  Temp (24hrs), Avg:98.1 F (36.7 C), Min:98 F (36.7 C), Max:98.1 F (36.7 C)   Recent Labs Lab 02/07/17 1023  WBC 7.7  CREATININE 0.81    Estimated Creatinine Clearance: 47.8 mL/min (by C-G formula based on SCr of 0.81 mg/dL).    No Known Allergies  Antimicrobials this admission: 7/14 vancomycin  >>  7/14 levofloxacin  >>   Dose adjustments this admission:  Microbiology results: 7/14 BCx: pending 7/14 Sputum: pending 7/14 MRSA PCR: pending  Thank you for allowing pharmacy to be a part of this patient's care.  SheePernell DuprearmD, BCPS Clinical Pharmacist 02/07/2017 3:04 PM

## 2017-02-07 NOTE — ED Triage Notes (Signed)
Pt also states her main complaint is shakes, weakness. She has also noticed nausea.

## 2017-02-08 DIAGNOSIS — J189 Pneumonia, unspecified organism: Secondary | ICD-10-CM | POA: Diagnosis not present

## 2017-02-08 DIAGNOSIS — Z87891 Personal history of nicotine dependence: Secondary | ICD-10-CM

## 2017-02-08 DIAGNOSIS — R918 Other nonspecific abnormal finding of lung field: Secondary | ICD-10-CM | POA: Diagnosis not present

## 2017-02-08 DIAGNOSIS — J432 Centrilobular emphysema: Secondary | ICD-10-CM | POA: Diagnosis not present

## 2017-02-08 DIAGNOSIS — I10 Essential (primary) hypertension: Secondary | ICD-10-CM | POA: Diagnosis not present

## 2017-02-08 DIAGNOSIS — R531 Weakness: Secondary | ICD-10-CM | POA: Diagnosis not present

## 2017-02-08 DIAGNOSIS — J449 Chronic obstructive pulmonary disease, unspecified: Secondary | ICD-10-CM | POA: Diagnosis not present

## 2017-02-08 DIAGNOSIS — K5900 Constipation, unspecified: Secondary | ICD-10-CM | POA: Diagnosis not present

## 2017-02-08 DIAGNOSIS — R0602 Shortness of breath: Secondary | ICD-10-CM | POA: Diagnosis not present

## 2017-02-08 LAB — BASIC METABOLIC PANEL
ANION GAP: 6 (ref 5–15)
BUN: 13 mg/dL (ref 6–20)
CHLORIDE: 108 mmol/L (ref 101–111)
CO2: 27 mmol/L (ref 22–32)
CREATININE: 0.99 mg/dL (ref 0.44–1.00)
Calcium: 9.1 mg/dL (ref 8.9–10.3)
GFR calc non Af Amer: 52 mL/min — ABNORMAL LOW (ref >60)
Glucose, Bld: 94 mg/dL (ref 65–99)
POTASSIUM: 4.4 mmol/L (ref 3.5–5.1)
SODIUM: 141 mmol/L (ref 135–145)

## 2017-02-08 MED ORDER — LEVOFLOXACIN 500 MG PO TABS: 500.0000 mg | ORAL_TABLET | Freq: Every day | ORAL | 0 refills | Status: DC

## 2017-02-08 MED ORDER — PREDNISONE 10 MG PO TABS: ORAL_TABLET | ORAL | 0 refills | Status: DC

## 2017-02-08 NOTE — Discharge Summary (Signed)
[facility] [place] NAME: Paige Hart    MR#:  [id number]  DATE OF BIRTH:  10-Dec-1934  DATE OF ADMISSION:  02/07/2017 ADMITTING PHYSICIAN: Fritzi Mandes, MD  DATE OF DISCHARGE: 02/08/2017  PRIMARY CARE PHYSICIAN: Glean Hess, MD    ADMISSION DIAGNOSIS:  Dyspnea [R06.00] Antibiotic-resistant bacterial infection [A49.9, Z16.30] Mass of right lung [R91.8] Community acquired pneumonia of left upper lobe of lung ([place]) [J18.1]  DISCHARGE DIAGNOSIS:  Active Problems:   SOB (shortness of breath)   SECONDARY DIAGNOSIS:   Past Medical History:  Diagnosis Date  . Asthma   . Colitis    left sided  . Cystocele   . Emphysema/COPD ([place])   . Hypertension   . Procidentia of uterus   . Thyroid disease   . Urge incontinence   . Vaginal atrophy   . Wears dentures    full upper and lower    HOSPITAL COURSE:   1.  Unresolving dense consolidation left upper lobe, spiculated nodule right upper lobe, mediastinal and left hilar adenopathy. Patient was started on antibiotics. Lungs sound clear. Patient was seen in consultation by pulmonary Dr. Alva Garnet and he set up an outpatient bronchoscopy on Thursday. Patient is stable for discharge home on Levaquin for 4 more days and prednisone 40 mg for 4 more days. Patient has an albuterol inhaler and Symbicort at home. Need to rule out underlying cancerous process with bronchoscopy. 2. History of COPD on albuterol inhaler and Symbicort 3. Essential hypertension on losartan 4. History of colitis  DISCHARGE CONDITIONS:   Satisfactory  CONSULTS OBTAINED:  Treatment Team:  Wilhelmina Mcardle, MD  DRUG ALLERGIES:  No Known Allergies  DISCHARGE MEDICATIONS:   Current Discharge Medication List    START taking these medications   Details  levofloxacin (LEVAQUIN) 500 MG tablet Take 1 tablet (500 mg total) by mouth daily. Qty: 4 tablet, Refills: 0    predniSONE (DELTASONE) 10 MG tablet 4 tabs po  daily for 4 days Qty: 16 tablet, Refills: 0      CONTINUE these medications which have NOT CHANGED   Details  albuterol (PROVENTIL HFA;VENTOLIN HFA) 108 (90 Base) MCG/ACT inhaler Inhale 2 puffs into the lungs every 4 (four) hours as needed for wheezing or shortness of breath. Qty: 1 Inhaler, Refills: 12   Associated Diagnoses: Other emphysema (HCC)    budesonide (ENTOCORT EC) 3 MG 24 hr capsule Take 3 capsules (9 mg total) by mouth every morning. Qty: 90 capsule, Refills: 1    budesonide-formoterol (SYMBICORT) 160-4.5 MCG/ACT inhaler Inhale 2 puffs into the lungs 2 (two) times daily. Qty: 10.2 g, Refills: 12   Associated Diagnoses: Other emphysema (HCC)    docusate sodium (COLACE) 100 MG capsule Take 1 capsule (100 mg total) by mouth every other day. Qty: 30 capsule, Refills: 2    ESTRACE VAGINAL 0.1 MG/GM vaginal cream INSERT 0.5GRAMS VAGINALLY ONCE WEEKLY Qty: 45 g, Refills: 1    guaiFENesin-dextromethorphan (ROBITUSSIN DM) 100-10 MG/5ML syrup Take 5 mLs by mouth every 4 (four) hours as needed for cough. Qty: 118 mL, Refills: 0    losartan (COZAAR) 100 MG tablet Take 1 tablet (100 mg total) by mouth daily. Qty: 90 tablet, Refills: 3   Associated Diagnoses: Essential (primary) hypertension    MULTIPLE VITAMINS-MINERALS PO Take 1 tablet by mouth daily.         DISCHARGE INSTRUCTIONS:   Follow-up for bronchoscopy on Thursday Follow-up PMD one week  If you experience worsening  of your admission symptoms, develop shortness of breath, life threatening emergency, suicidal or homicidal thoughts you must seek medical attention immediately by calling 911 or calling your MD immediately  if symptoms less severe.  You Must read complete instructions/literature along with all the possible adverse reactions/side effects for all the Medicines you take and that have been prescribed to you. Take any new Medicines after you have completely understood and accept all the possible adverse  reactions/side effects.   Please note  You were cared for by a hospitalist during your hospital stay. If you have any questions about your discharge medications or the care you received while you were in the hospital after you are discharged, you can call the unit and asked to speak with the hospitalist on call if the hospitalist that took care of you is not available. Once you are discharged, your primary care physician will handle any further medical issues. Please note that NO REFILLS for any discharge medications will be authorized once you are discharged, as it is imperative that you return to your primary care physician (or establish a relationship with a primary care physician if you do not have one) for your aftercare needs so that they can reassess your need for medications and monitor your lab values.    Today   CHIEF COMPLAINT:   Chief Complaint  Patient presents with  . Shortness of Breath    HISTORY OF PRESENT ILLNESS:  Paige Hart  is a 82 y.o. female presents with shortness of breath on exertion. Found to have pneumonia that has not gone away.   VITAL SIGNS:  Blood pressure (!) 132/58, pulse 93, temperature 99.3 F (37.4 C), temperature source Oral, resp. rate 20, height 5' 4"  (1.626 m), weight 63.7 kg (140 lb 6.4 oz), SpO2 92 %.    PHYSICAL EXAMINATION:  GENERAL:  82 y.o.-year-old patient lying in the bed with no acute distress.  EYES: Pupils equal, round, reactive to light and accommodation. No scleral icterus. Extraocular muscles intact.  HEENT: Head atraumatic, normocephalic. Oropharynx and nasopharynx clear.  NECK:  Supple, no jugular venous distention. No thyroid enlargement, no tenderness.  LUNGS: Normal breath sounds bilaterally, no wheezing, rales,rhonchi or crepitation. No use of accessory muscles of respiration.  CARDIOVASCULAR: S1, S2 normal. No murmurs, rubs, or gallops.  ABDOMEN: Soft, non-tender, non-distended. Bowel sounds present. No organomegaly  or mass.  EXTREMITIES: No pedal edema, cyanosis, or clubbing.  NEUROLOGIC: Cranial nerves II through XII are intact. Muscle strength 5/5 in all extremities. Sensation intact. Gait not checked.  PSYCHIATRIC: The patient is alert and oriented x 3.  SKIN: No obvious rash, lesion, or ulcer.   DATA REVIEW:   CBC  Recent Labs Lab 02/07/17 1023  WBC 7.7  HGB 13.5  HCT 40.8  PLT 260    Chemistries   Recent Labs Lab 02/08/17 0538  NA 141  K 4.4  CL 108  CO2 27  GLUCOSE 94  BUN 13  CREATININE 0.99  CALCIUM 9.1    Cardiac Enzymes  Recent Labs Lab 02/07/17 1023  [place] <0.03    Microbiology Results  Results for orders placed or performed during the hospital encounter of 02/07/17  Blood culture (routine x 2)     Status: None (Preliminary result)   Collection Time: 02/07/17  1:30 PM  Result Value Ref Range Status   Specimen Description BLOOD RIGHT ANTECUBITAL  Final   Special Requests   Final    BOTTLES DRAWN AEROBIC AND ANAEROBIC Blood Culture adequate  volume   Culture NO GROWTH < 24 HOURS  Final   Report Status PENDING  Incomplete  Blood culture (routine x 2)     Status: None (Preliminary result)   Collection Time: 02/07/17  1:45 PM  Result Value Ref Range Status   Specimen Description BLOOD BLOOD RIGHT FOREARM  Final   Special Requests   Final    BOTTLES DRAWN AEROBIC AND ANAEROBIC Blood Culture adequate volume   Culture NO GROWTH < 24 HOURS  Final   Report Status PENDING  Incomplete  MRSA PCR Screening     Status: None   Collection Time: 02/07/17  4:40 PM  Result Value Ref Range Status   MRSA by PCR NEGATIVE NEGATIVE Final    Comment:        The GeneXpert MRSA Assay (FDA approved for NASAL specimens only), is one component of a comprehensive MRSA colonization surveillance program. It is not intended to diagnose MRSA infection nor to guide or monitor treatment for MRSA infections.     RADIOLOGY:  Dg Chest 2 View  Result Date: 02/07/2017 CLINICAL  DATA:  Pt with complaints of shortness of breath and weakness. Reports she was recently treated inpatient for left upper pneumonia the very end of June, had a follow-up chest x-ray this past week that showed improving pneumonia. Son reports that patient's shortness of breath acutely returned this past Wednesday or Thursday morning. Patient reports some intermittent wheezing, last use inhaler at 6 AM. Reports feels shaky and generalized weak. Decreased appetite, continues to drink fluids well. Does further report has some left posterior calf pain that is atypical for her and different. Denies known fevers. Former smoker. EXAM: CHEST  2 VIEW COMPARISON:  02/02/2017 FINDINGS: The area of opacity in the central left upper lobe has mildly increased from the prior study. Remainder of the lungs is clear. Lungs are hyperexpanded. No pleural effusion.  No pneumothorax. Cardiac silhouette is normal in size. No mediastinal masses or convincing adenopathy. Left superior hilum is obscured by the contiguous left upper lobe opacity. Right hilum is unremarkable. Skeletal structures are demineralized but intact. IMPRESSION: 1. Left upper lobe opacity has mildly increased since the most recent prior exam. Although this remains consistent with pneumonia, an underlying mass should be considered. Recommend followup chest CT with contrast for further assessment. 2. COPD. Electronically Signed   By: Lajean Manes M.D.   On: 02/07/2017 10:43   Ct Angio Chest Pe W Or Wo Contrast  Result Date: 02/07/2017 CLINICAL DATA:  Shortness of breath. EXAM: CT ANGIOGRAPHY CHEST WITH CONTRAST TECHNIQUE: Multidetector CT imaging of the chest was performed using the standard protocol during bolus administration of intravenous contrast. Multiplanar CT image reconstructions and MIPs were obtained to evaluate the vascular anatomy. CONTRAST:  75 cc Isovue 370 COMPARISON:  Chest radiographs obtained earlier today and previously. FINDINGS: Cardiovascular:  Normally opacified pulmonary arteries with no pulmonary arterial filling defects. Aortic calcifications. Mediastinum/Nodes: Mildly enlarged thyroid gland containing multiple calcified and noncalcified nodules. The largest nodules on the left, measuring 1.7 cm in maximum diameter on image number 9 of series 4. Enlarged mediastinal and left hilar lymph nodes. The largest noted is an AP window node with a short axis diameter of 2.3 cm on image number 32 of series 4. The largest left hilar node has a short axis diameter of 1.6 cm on image number 42 of series 4. Lungs/Pleura: Dense consolidation in the central left upper lobe containing normally opacified pulmonary arteries. The area of consolidation measures  7.6 x 6.9 cm on image number 31 of series 6. There is a gradually occluded bronchus extending into this area containing patchy filling defects. There is patchy opacity in the majority of the remainder of the left upper lobe peripheral to this dense consolidation. Also demonstrated is a 1.1 x 1.0 cm rounded, mildly irregular and spiculated nodule in the posteromedial aspect of the right upper lobe on image number 34 of series 6. Mild bullous changes in both lungs.  No pleural fluid. Upper Abdomen: Unremarkable.  Normal appearing adrenal glands. Musculoskeletal: Thoracic spine degenerative changes. Review of the MIP images confirms the above findings. IMPRESSION: 1. 7.6 x 6.9 cm rounded area of dense, confluent consolidation in the left upper lobe with less pronounced patchy opacity distal to this area in the majority of the left upper lobe. This is compatible with dense, consolidative pneumonia and less dense pneumonia more distally in the left upper lobe. An underlying mass with peripheral postobstructive changes cannot be excluded. 2. 1.1 x 1.0 cm mildly irregular and spiculated nodule in the right upper lobe. This has CT features suspicious for a primary lung carcinoma. 3. Mediastinal and left hilar adenopathy.  This is most likely reactive adenopathy associated with left upper lobe pneumonia. If there is an underlying neoplasm, this could represent metastatic adenopathy. 4. COPD with centrilobular emphysema. 5. Aortic atherosclerosis. 6. Multinodular thyroid goiter. The largest nodule measures 1.7 cm. Consider further evaluation with thyroid ultrasound. If patient is clinically hyperthyroid, consider nuclear medicine thyroid uptake and scan. Aortic Atherosclerosis (ICD10-I70.0) and Emphysema (ICD10-J43.9). Electronically Signed   By: Claudie Revering M.D.   On: 02/07/2017 13:11   US Venous Img Lower Unilateral Left  Result Date: 02/07/2017 CLINICAL DATA:  Left lower extremity pain. EXAM: LEFT LOWER EXTREMITY VENOUS DOPPLER ULTRASOUND TECHNIQUE: Gray-scale sonography with graded compression, as well as color Doppler and duplex ultrasound were performed to evaluate the lower extremity deep venous systems from the level of the common femoral vein and including the common femoral, femoral, profunda femoral, popliteal and calf veins including the posterior tibial, peroneal and gastrocnemius veins when visible. The superficial great saphenous vein was also interrogated. Spectral Doppler was utilized to evaluate flow at rest and with distal augmentation maneuvers in the common femoral, femoral and popliteal veins. COMPARISON:  None. FINDINGS: Contralateral Common Femoral Vein: Respiratory phasicity is normal and symmetric with the symptomatic side. No evidence of thrombus. Normal compressibility. Common Femoral Vein: No evidence of thrombus. Normal compressibility, respiratory phasicity and response to augmentation. Saphenofemoral Junction: No evidence of thrombus. Normal compressibility and flow on color Doppler imaging. Profunda Femoral Vein: No evidence of thrombus. Normal compressibility and flow on color Doppler imaging. Femoral Vein: No evidence of thrombus. Normal compressibility, respiratory phasicity and response to  augmentation. Popliteal Vein: No evidence of thrombus. Normal compressibility, respiratory phasicity and response to augmentation. Calf Veins: No evidence of thrombus. Normal compressibility and flow on color Doppler imaging. Superficial Great Saphenous Vein: No evidence of thrombus. Normal compressibility and flow on color Doppler imaging. Venous Reflux:  None. Other Findings:  None. IMPRESSION: No evidence of DVT within the left lower extremity. Electronically Signed   By: Rolm Baptise M.D.   On: 02/07/2017 12:35       Management plans discussed with the patient, and she is in agreement. Dr. Alva Garnet spoke with admitting physician Dr. Posey Pronto for discharge today  CODE STATUS:     Code Status Orders        Start     Ordered  02/07/17 1548  Full code  Continuous     02/07/17 1547    Code Status History    Date Active Date Inactive Code Status Order ID Comments User Context   01/22/2017  5:34 PM 01/25/2017  1:55 PM Full Code [id number]  Loletha Grayer, MD ED    Advance Directive Documentation     Most Recent Value  Type of Advance Directive  Healthcare Power of Attorney  Pre-existing out of facility DNR order (yellow form or pink MOST form)  -  "MOST" Form in Place?  -      TOTAL TIME TAKING CARE OF THIS PATIENT: 32 minutes.    Loletha Grayer M.D on 02/08/2017 at 2:46 PM  Between 7am to 6pm - Pager - [phone]  After 6pm go to [email or web address] - password [email or web address] [facility]  [phone]  CC: Primary care physician; Glean Hess, MD

## 2017-02-08 NOTE — Progress Notes (Signed)
Awaiting son to provide transportation. Madlyn Frankel, RN

## 2017-02-08 NOTE — Progress Notes (Signed)
Discharge instructions given and went over with patient at bedside. Prescriptions reviewed. All questions answered. Patient to discharge home after infusion of IV antibiotic is complete. Madlyn Frankel, RN

## 2017-02-08 NOTE — Progress Notes (Signed)
Patient discharged home with son via wheelchair by nursing staff Madlyn Frankel, RN

## 2017-02-08 NOTE — Consult Note (Signed)
PULMONARY CONSULT NOTE  Requesting MD/Service: Fritzi Mandes, MD Date of initial consultation: 02/08/17 Reason for consultation: Nonresolving PNA vs Lung mass  PT PROFILE: 81 y.o. female former smoker (1/2-1 PPD X greater than 40 yrs, quit 2005) recently treated for PNA, returned to ED with recurrent SOB and persistent opacity in LUL. Underwent CT chest with concern for possible mass  HPI:  She initially presented 06/26 with 2-3 days of SOB/DOE over her baseline and her CXR revealed LUL opacity. She was administered methylprednisolone and given a prescription for azithromycin. At that time, she had no fever, CP, purulent sputum or hemoptysis. She initially felt better but symptoms relapsed a couple of days later and was admitted to [facility] 06/28 with a diagnosis of CAP. She was improved and discharged to home 07/01. She returned to [facility] ED 07/13 with increased dyspnea. She again denies fever, CP, purulent sputum and hemoptysis. She has had no unexplained weight loss.   Past Medical History:  Diagnosis Date  . Asthma   . Colitis    left sided  . Cystocele   . Emphysema/COPD ([place])   . Hypertension   . Procidentia of uterus   . Thyroid disease   . Urge incontinence   . Vaginal atrophy   . Wears dentures    full upper and lower    Past Surgical History:  Procedure Laterality Date  . COLONOSCOPY WITH PROPOFOL N/A 08/11/2016   Procedure: COLONOSCOPY WITH PROPOFOL;  Surgeon: [name], MD;  Location: [place];  Service: Endoscopy;  Laterality: N/A;  . OOPHORECTOMY Left 1970  . POLYPECTOMY  08/11/2016   Procedure: POLYPECTOMY;  Surgeon: [name], MD;  Location: [place];  Service: Endoscopy;;    MEDICATIONS: I have reviewed all medications and confirmed regimen as documented  Social History   Social History  . Marital status: Widowed    Spouse name: N/A  . Number of children: N/A  . Years of education: N/A   Occupational History  . Not on file.   Social  History Main Topics  . Smoking status: Former Smoker    Quit date: 07/28/2004  . Smokeless tobacco: Never Used  . Alcohol use No  . Drug use: No  . Sexual activity: Not Currently   Other Topics Concern  . Not on file   Social History Narrative  . No narrative on file    Family History  Problem Relation Age of Onset  . Ovarian cancer Mother   . Lung cancer Father   . Mental illness Sister   . Diabetes Neg Hx   . Heart disease Neg Hx   . Breast cancer Neg Hx   . Colon cancer Neg Hx     ROS: No fever, myalgias/arthralgias, unexplained weight loss or weight gain No new focal weakness or sensory deficits No otalgia, hearing loss, visual changes, nasal and sinus symptoms, mouth and throat problems No neck pain or adenopathy No abdominal pain, N/V/D, diarrhea, change in bowel pattern No dysuria, change in urinary pattern   Vitals:   02/07/17 2018 02/08/17 0159 02/08/17 0345 02/08/17 1214  BP: (!) 146/74  (!) 156/63 (!) 132/58  Pulse: 79  81 93  Resp: 16  18 20   Temp: 98.2 F (36.8 C)  97.6 F (36.4 C) 99.3 F (37.4 C)  TempSrc: Oral  Oral Oral  SpO2: 96% 94% 91% 92%  Weight:      Height:         EXAM:  Gen: WDWN, No overt  respiratory distress HEENT: NCAT, sclera white, oropharynx normal Neck: Supple without LAN, thyromegaly, JVD Lungs: breath sounds full and slightly coarse, percussion normal, Scattered wheezes, particularly in LUL area Cardiovascular: Reg, no murmurs noted Abdomen: Soft, nontender, normal BS Ext: without clubbing, cyanosis, edema Neuro: CNs grossly intact, motor and sensory intact Skin: Limited exam, no lesions noted  DATA:   BMP Latest Ref Rng & Units 02/08/2017 02/07/2017 01/24/2017  Glucose 65 - 99 mg/dL 94 118(H) 87  BUN 6 - 20 mg/dL 13 16 29(H)  Creatinine 0.44 - 1.00 mg/dL 0.99 0.81 1.03(H)  BUN/Creat Ratio 12 - 28 - - -  Sodium 135 - 145 mmol/L 141 139 137  Potassium 3.5 - 5.1 mmol/L 4.4 4.0 4.6  Chloride 101 - 111 mmol/L 108 105 102   CO2 22 - 32 mmol/L 27 26 31   Calcium 8.9 - 10.3 mg/dL 9.1 9.5 9.0    CBC Latest Ref Rng & Units 02/07/2017 01/24/2017 01/23/2017  WBC 3.6 - 11.0 K/uL 7.7 10.5 12.9(H)  Hemoglobin 12.0 - 16.0 g/dL 13.5 13.5 13.5  Hematocrit 35.0 - 47.0 % 40.8 40.3 41.1  Platelets 150 - 440 K/uL 260 257 262    CXR:  LUL opacity  CT chest: Mild emphysema, large area of consolidation of LUL, L hilar adenopathy, RUL lobulated nodule  IMPRESSION:   - Former smoker - Mild emphysema by CT chest - I am concerned that the LUL opacity is malignancy or could represent post-obstructive changes - The RUL nodule is highly suggestive of malignancy  - mild wheezing especially localized to LUL. Concern for partial airway obstruction    PLAN:  Will proceed with bronchoscopy. This can be performed as an outpt. We discussed the procedure, its risks and its alternatives. She wishes to go home today and we should be able to set this for Thurs 07/19. She already has a follow up scheduled with me 07/31 and we will keep this appointment on the books.  Discussed with Dr Posey Pronto and recommended that she can be discharged home with Levofloxacin X 5 days, prednisone 40 mg daily X 5 days. I will get her officially scheduled tomorrow   Merton Border, MD PCCM service Mobile [phone] Pager [phone] 02/08/2017

## 2017-02-09 ENCOUNTER — Telehealth: Payer: Self-pay

## 2017-02-09 NOTE — Telephone Encounter (Signed)
Yes may refill.

## 2017-02-09 NOTE — Telephone Encounter (Signed)
Pt calling from hospital- was told by doctor there to get Dr. Army Melia to review what is happening- the pneumonia is not clearing up and they are going to do a outpatient procedure this Thursday to "go into lungs and see what's going on." Please advise.

## 2017-02-09 NOTE — Telephone Encounter (Signed)
Let her know that I am aware of the findings and that I agree with proceeding with additional testing and biopsy.

## 2017-02-09 NOTE — Telephone Encounter (Signed)
Rx for Budesonide 48m sent to pt's pharmacy.

## 2017-02-09 NOTE — Telephone Encounter (Signed)
Pt informed and understood verbally

## 2017-02-12 ENCOUNTER — Encounter: Payer: Self-pay | Admitting: *Deleted

## 2017-02-12 ENCOUNTER — Other Ambulatory Visit: Payer: Self-pay | Admitting: *Deleted

## 2017-02-12 ENCOUNTER — Ambulatory Visit: Payer: Medicare Other

## 2017-02-12 ENCOUNTER — Encounter
Admission: RE | Disposition: A | Discharge status: Home / Self Care | Payer: Self-pay | Source: Ambulatory Visit | Attending: Pulmonary Disease

## 2017-02-12 ENCOUNTER — Ambulatory Visit
Admission: RE | Admit: 2017-02-12 | Discharge: 2017-02-12 | Disposition: A | Discharge status: Home / Self Care | Payer: Medicare Other | Source: Ambulatory Visit | Attending: Pulmonary Disease | Admitting: Pulmonary Disease

## 2017-02-12 DIAGNOSIS — Z87891 Personal history of nicotine dependence: Secondary | ICD-10-CM | POA: Insufficient documentation

## 2017-02-12 DIAGNOSIS — R918 Other nonspecific abnormal finding of lung field: Secondary | ICD-10-CM

## 2017-02-12 DIAGNOSIS — C3412 Malignant neoplasm of upper lobe, left bronchus or lung: Secondary | ICD-10-CM | POA: Insufficient documentation

## 2017-02-12 DIAGNOSIS — I10 Essential (primary) hypertension: Secondary | ICD-10-CM | POA: Diagnosis not present

## 2017-02-12 DIAGNOSIS — J439 Emphysema, unspecified: Secondary | ICD-10-CM | POA: Diagnosis not present

## 2017-02-12 HISTORY — PX: FLEXIBLE BRONCHOSCOPY: SHX5094

## 2017-02-12 LAB — CULTURE, BLOOD (ROUTINE X 2)
CULTURE: NO GROWTH
Culture: NO GROWTH
SPECIAL REQUESTS: ADEQUATE
SPECIAL REQUESTS: ADEQUATE

## 2017-02-12 SURGERY — BRONCHOSCOPY, FLEXIBLE
Anesthesia: Moderate Sedation

## 2017-02-12 MED ORDER — FENTANYL CITRATE (PF) 100 MCG/2ML IJ SOLN
INTRAMUSCULAR | Status: AC
  Filled 2017-02-12: qty 4

## 2017-02-12 MED ORDER — MIDAZOLAM HCL 2 MG/2ML IJ SOLN
INTRAMUSCULAR | Status: AC | PRN
  Administered 2017-02-12: 2 mg via INTRAVENOUS

## 2017-02-12 MED ORDER — PHENYLEPHRINE HCL 0.25 % NA SOLN
1.0000 | Freq: Four times a day (QID) | NASAL | Status: DC | PRN
  Filled 2017-02-12: qty 15

## 2017-02-12 MED ORDER — FENTANYL CITRATE (PF) 100 MCG/2ML IJ SOLN
INTRAMUSCULAR | Status: AC | PRN
  Administered 2017-02-12: 12.5 ug via INTRAVENOUS
  Administered 2017-02-12: 25 ug via INTRAVENOUS

## 2017-02-12 MED ORDER — BUTAMBEN-TETRACAINE-BENZOCAINE 2-2-14 % EX AERO
1.0000 | INHALATION_SPRAY | Freq: Once | CUTANEOUS | Status: DC
  Filled 2017-02-12: qty 20

## 2017-02-12 MED ORDER — MIDAZOLAM HCL 5 MG/5ML IJ SOLN
INTRAMUSCULAR | Status: AC
  Filled 2017-02-12: qty 5

## 2017-02-12 MED ORDER — LIDOCAINE HCL 2 % EX GEL: 1.0000 "application " | Freq: Once | CUTANEOUS | Status: DC

## 2017-02-12 NOTE — H&P (View-Only) (Signed)
PULMONARY CONSULT NOTE  Requesting MD/Service: Fritzi Mandes, MD Date of initial consultation: 02/08/17 Reason for consultation: Nonresolving PNA vs Lung mass  PT PROFILE: 82 y.o. female former smoker (1/2-1 PPD X greater than 40 yrs, quit 2005) recently treated for PNA, returned to ED with recurrent SOB and persistent opacity in LUL. Underwent CT chest with concern for possible mass  HPI:  She initially presented 06/26 with 2-3 days of SOB/DOE over her baseline and her CXR revealed LUL opacity. She was administered methylprednisolone and given a prescription for azithromycin. At that time, she had no fever, CP, purulent sputum or hemoptysis. She initially felt better but symptoms relapsed a couple of days later and was admitted to [facility]/28 with a diagnosis of CAP. She was improved and discharged to home 07/01. She returned to [facility] ED 07/13 with increased dyspnea. She again denies fever, CP, purulent sputum and hemoptysis. She has had no unexplained weight loss.   Past Medical History:  Diagnosis Date  . Asthma   . Colitis    left sided  . Cystocele   . Emphysema/COPD ([place])   . Hypertension   . Procidentia of uterus   . Thyroid disease   . Urge incontinence   . Vaginal atrophy   . Wears dentures    full upper and lower    Past Surgical History:  Procedure Laterality Date  . COLONOSCOPY WITH PROPOFOL N/A 08/11/2016   Procedure: COLONOSCOPY WITH PROPOFOL;  Surgeon: [name], MD;  Location: [place];  Service: Endoscopy;  Laterality: N/A;  . OOPHORECTOMY Left 1970  . POLYPECTOMY  08/11/2016   Procedure: POLYPECTOMY;  Surgeon: [name], MD;  Location: [place];  Service: Endoscopy;;    MEDICATIONS: I have reviewed all medications and confirmed regimen as documented  Social History   Social History  . Marital status: Widowed    Spouse name: N/A  . Number of children: N/A  . Years of education: N/A   Occupational History  . Not on file.   Social  History Main Topics  . Smoking status: Former Smoker    Quit date: 07/28/2004  . Smokeless tobacco: Never Used  . Alcohol use No  . Drug use: No  . Sexual activity: Not Currently   Other Topics Concern  . Not on file   Social History Narrative  . No narrative on file    Family History  Problem Relation Age of Onset  . Ovarian cancer Mother   . Lung cancer Father   . Mental illness Sister   . Diabetes Neg Hx   . Heart disease Neg Hx   . Breast cancer Neg Hx   . Colon cancer Neg Hx     ROS: No fever, myalgias/arthralgias, unexplained weight loss or weight gain No new focal weakness or sensory deficits No otalgia, hearing loss, visual changes, nasal and sinus symptoms, mouth and throat problems No neck pain or adenopathy No abdominal pain, N/V/D, diarrhea, change in bowel pattern No dysuria, change in urinary pattern   Vitals:   02/07/17 2018 02/08/17 0159 02/08/17 0345 02/08/17 1214  BP: (!) 146/74  (!) 156/63 (!) 132/58  Pulse: 79  81 93  Resp: 16  18 20   Temp: 98.2 F (36.8 C)  97.6 F (36.4 C) 99.3 F (37.4 C)  TempSrc: Oral  Oral Oral  SpO2: 96% 94% 91% 92%  Weight:      Height:         EXAM:  Gen: WDWN, No overt  respiratory distress HEENT: NCAT, sclera white, oropharynx normal Neck: Supple without LAN, thyromegaly, JVD Lungs: breath sounds full and slightly coarse, percussion normal, Scattered wheezes, particularly in LUL area Cardiovascular: Reg, no murmurs noted Abdomen: Soft, nontender, normal BS Ext: without clubbing, cyanosis, edema Neuro: CNs grossly intact, motor and sensory intact Skin: Limited exam, no lesions noted  DATA:   BMP Latest Ref Rng & Units 02/08/2017 02/07/2017 01/24/2017  Glucose 65 - 99 mg/dL 94 118(H) 87  BUN 6 - 20 mg/dL 13 16 29(H)  Creatinine 0.44 - 1.00 mg/dL 0.99 0.81 1.03(H)  BUN/Creat Ratio 12 - 28 - - -  Sodium 135 - 145 mmol/L 141 139 137  Potassium 3.5 - 5.1 mmol/L 4.4 4.0 4.6  Chloride 101 - 111 mmol/L 108 105 102   CO2 22 - 32 mmol/L 27 26 31   Calcium 8.9 - 10.3 mg/dL 9.1 9.5 9.0    CBC Latest Ref Rng & Units 02/07/2017 01/24/2017 01/23/2017  WBC 3.6 - 11.0 K/uL 7.7 10.5 12.9(H)  Hemoglobin 12.0 - 16.0 g/dL 13.5 13.5 13.5  Hematocrit 35.0 - 47.0 % 40.8 40.3 41.1  Platelets 150 - 440 K/uL 260 257 262    CXR:  LUL opacity  CT chest: Mild emphysema, large area of consolidation of LUL, L hilar adenopathy, RUL lobulated nodule  IMPRESSION:   - Former smoker - Mild emphysema by CT chest - I am concerned that the LUL opacity is malignancy or could represent post-obstructive changes - The RUL nodule is highly suggestive of malignancy  - mild wheezing especially localized to LUL. Concern for partial airway obstruction    PLAN:  Will proceed with bronchoscopy. This can be performed as an outpt. We discussed the procedure, its risks and its alternatives. She wishes to go home today and we should be able to set this for Thurs 07/19. She already has a follow up scheduled with me 07/31 and we will keep this appointment on the books.  Discussed with Dr Posey Pronto and recommended that she can be discharged home with Levofloxacin X 5 days, prednisone 40 mg daily X 5 days. I will get her officially scheduled tomorrow   Merton Border, MD PCCM service Mobile [phone] Pager [phone] 02/08/2017

## 2017-02-12 NOTE — Interval H&P Note (Signed)
History and Physical Interval Note:  02/12/2017 1:29 PM  Paige Hart  has presented today for surgery, with the diagnosis of Regular Bronchoscopy    Lung Mass [facility] emailed  C-arm needed  Called Rad Dept to confirm  The various methods of treatment have been discussed with the patient and family. After consideration of risks, benefits and other options for treatment, the patient has consented to  Procedure(s): FLEXIBLE BRONCHOSCOPY (N/A) as a surgical intervention .  The patient's history has been reviewed, patient examined, no change in status, stable for surgery.  I have reviewed the patient's chart and labs.  Questions were answered to the patient's satisfaction.     Merton Border, MD PCCM service Mobile [phone] Pager [phone] 02/12/2017 1:30 PM

## 2017-02-12 NOTE — Discharge Instructions (Signed)
Moderate Conscious Sedation, Adult, Care After These instructions provide you with information about caring for yourself after your procedure. Your health care provider may also give you more specific instructions. Your treatment has been planned according to current medical practices, but problems sometimes occur. Call your health care provider if you have any problems or questions after your procedure. What can I expect after the procedure? After your procedure, it is common:  To feel sleepy for several hours.  To feel clumsy and have poor balance for several hours.  To have poor judgment for several hours.  To vomit if you eat too soon.  Follow these instructions at home: For at least 24 hours after the procedure:   Do not: ? Participate in activities where you could fall or become injured. ? Drive. ? Use heavy machinery. ? Drink alcohol. ? Take sleeping pills or medicines that cause drowsiness. ? Make important decisions or sign legal documents. ? Take care of children on your own.  Rest. Eating and drinking  Follow the diet recommended by your health care provider.  If you vomit: ? Drink water, juice, or soup when you can drink without vomiting. ? Make sure you have little or no nausea before eating solid foods. General instructions  Have a responsible adult stay with you until you are awake and alert.  Take over-the-counter and prescription medicines only as told by your health care provider.  If you smoke, do not smoke without supervision.  Keep all follow-up visits as told by your health care provider. This is important. Contact a health care provider if:  You keep feeling nauseous or you keep vomiting.  You feel light-headed.  You develop a rash.  You have a fever. Get help right away if:  You have trouble breathing. This information is not intended to replace advice given to you by your health care provider. Make sure you discuss any questions you have  with your health care provider. Document Released: 05/04/2013 Document Revised: 12/17/2015 Document Reviewed: 11/03/2015 Elsevier Interactive Patient Education  2018 [employer]. Flexible Bronchoscopy, Care After This sheet gives you information about how to care for yourself after your procedure. Your health care provider may also give you more specific instructions. If you have problems or questions, contact your health care provider. What can I expect after the procedure? After the procedure, it is common to have the following symptoms for 24-48 hours:  A cough that is worse than it was before the procedure.  A low-grade fever.  A sore throat or hoarse voice.  Small streaks of blood in the mucus from your lungs (sputum), if tissue samples were removed (biopsy).  Follow these instructions at home: Eating and drinking  Do not eat or drink anything (including water) for 2 hours after your procedure, or until your numbing medicine (local anesthetic) has worn off. Having a numb throat increases your risk of burning yourself or choking.  After your numbness is gone and your cough and gag reflexes have returned, you may start eating only soft foods and slowly drinking liquids.  The day after the procedure, return to your normal diet. Driving  Do not drive for 24 hours if you were given a medicine to help you relax (sedative).  Do not drive or use heavy machinery while taking prescription pain medicine. General instructions  Take over-the-counter and prescription medicines only as told by your health care provider.  Return to your normal activities as told by your health care provider. Ask your  health care provider what activities are safe for you.  Do not use any products that contain nicotine or tobacco, such as cigarettes and e-cigarettes. If you need help quitting, ask your health care provider.  Keep all follow-up visits as told by your health care provider. This is important,  especially if you had a biopsy taken. Get help right away if:  You have shortness of breath that gets worse.  You become light-headed or feel like you might faint.  You have chest pain.  You cough up more than a small amount of blood.  The amount of blood you cough up increases. Summary  Common symptoms in the 24-48 hours following a flexible bronchoscopy include cough, low-grade fever, sore throat or hoarse voice, and blood-streaked mucus from the lungs (if you had a biopsy).  Do not eat or drink anything (including water) for 2 hours after your procedure, or until your local anesthetic has worn off. You can return to your normal diet the day after the procedure.  Get help right away if you develop worsening shortness of breath, have chest pain, become light-headed, or cough up more than a small amount of blood. This information is not intended to replace advice given to you by your health care provider. Make sure you discuss any questions you have with your health care provider. Document Released: 01/31/2005 Document Revised: 08/01/2016 Document Reviewed: 08/01/2016 Elsevier Interactive Patient Education  2017 [employer].

## 2017-02-13 ENCOUNTER — Telehealth: Payer: Self-pay | Admitting: *Deleted

## 2017-02-13 ENCOUNTER — Encounter: Payer: Self-pay | Admitting: *Deleted

## 2017-02-13 ENCOUNTER — Encounter: Payer: Self-pay | Admitting: Pulmonary Disease

## 2017-02-13 LAB — CYTOLOGY - NON PAP

## 2017-02-13 LAB — SURGICAL PATHOLOGY

## 2017-02-13 NOTE — Progress Notes (Signed)
  Oncology Nurse Navigator Documentation  Navigator Location: [facility] (02/13/17 1500) Referral date to RadOnc/MedOnc: 02/12/17 (02/13/17 1500) )Navigator Encounter Type: Introductory phone call (02/13/17 1500)   Abnormal Finding Date: 02/07/17 (02/13/17 1500)                     Barriers/Navigation Needs: No barriers at this time (02/13/17 1500)   Interventions: Coordination of Care (02/13/17 1500)   Coordination of Care: Appts (02/13/17 1500)        Acuity: Level 2 (02/13/17 1500)   Acuity Level 2: Educational needs;Initial guidance, education and coordination as needed;Assistance expediting appointments (02/13/17 1500)    Phone call made to patient to introduce to navigator services. Pt informed of appointment on Thursday 7/26 at 10am with Dr. Tasia Catchings in our [facility] location. Informed pt that new patient packet has been mailed to her and instructed pt to bring photo ID, insurance card, and list of medications with her to her appt. Contact info given to patient and instructed to call if has any further questions. Pt verbalized understanding and confirmed appt.    Time Spent with Patient: 30 (02/13/17 1500)

## 2017-02-13 NOTE — Telephone Encounter (Signed)
[facility] pathologist call report 4:35  Left upper lobe Dx: Adenoma carcinoma

## 2017-02-14 ENCOUNTER — Encounter: Payer: Self-pay | Admitting: Internal Medicine

## 2017-02-14 DIAGNOSIS — C3412 Malignant neoplasm of upper lobe, left bronchus or lung: Secondary | ICD-10-CM | POA: Insufficient documentation

## 2017-02-14 LAB — LEGIONELLA PNEUMOPHILA SEROGP 1 UR AG: L. PNEUMOPHILA SEROGP 1 UR AG: NEGATIVE

## 2017-02-16 NOTE — Procedures (Signed)
Indication:   LUL opacity/mass  Premedication:   Fentanyl 37.5 mcg Midaz 4 mg  Anesthesia: Topical to nose and throat 40 cc of 1% lidocaine used during the course of procedure  Procedure: After adequate sedation and anesthesia, the bronchoscope was introduced via the L naris and advanced into the posterior pharynx. Further anesthesia was obtained with 1% lidocaine and the scope was advanced into the trachea. Complete airway anesthesia was achieved with 1% lidocaine and a thorough airway examination was performed. This revealed the following findings:  Findings:  Upper airway - Polyp on L vocal cord Tracheobronchial anatomy - anatomic variant in RUL. No definite endobronchial tumor, mass or FB. There might have been EB tumor noted distally in the LUL bronchus - it was difficult to discern whether it was tumor or abnormal airway mucosa Bronchial mucosa - diffuse changes of chronic bronchitis Other -   Specimens:   BAL from LUL Cytology brushings from LUL  Transbronchial biopsy (with flouroscopic guidance) from LUL  All specimens were sent for cytology and surgical pathology  Complications: none  Post procedure evaluation:  The patient tolerated the procedure well with no major complications CXR - not performed   Merton Border, MD PCCM service Mobile [phone] Pager [phone]  02/16/2017

## 2017-02-17 ENCOUNTER — Ambulatory Visit
Admission: RE | Admit: 2017-02-17 | Discharge: 2017-02-17 | Disposition: A | Discharge status: Home / Self Care | Payer: Medicare Other | Source: Ambulatory Visit | Attending: Pulmonary Disease | Admitting: Pulmonary Disease

## 2017-02-17 DIAGNOSIS — R590 Localized enlarged lymph nodes: Secondary | ICD-10-CM | POA: Insufficient documentation

## 2017-02-17 DIAGNOSIS — J189 Pneumonia, unspecified organism: Secondary | ICD-10-CM | POA: Insufficient documentation

## 2017-02-17 DIAGNOSIS — R918 Other nonspecific abnormal finding of lung field: Secondary | ICD-10-CM

## 2017-02-17 DIAGNOSIS — K5730 Diverticulosis of large intestine without perforation or abscess without bleeding: Secondary | ICD-10-CM | POA: Insufficient documentation

## 2017-02-17 DIAGNOSIS — I700 Atherosclerosis of aorta: Secondary | ICD-10-CM | POA: Diagnosis not present

## 2017-02-17 LAB — GLUCOSE, CAPILLARY: GLUCOSE-CAPILLARY: 88 mg/dL (ref 65–99)

## 2017-02-17 MED ORDER — FLUDEOXYGLUCOSE F - 18 (FDG) INJECTION
12.0000 | Freq: Once | INTRAVENOUS | Status: AC | PRN
  Administered 2017-02-17: 12.73 via INTRAVENOUS

## 2017-02-18 ENCOUNTER — Encounter: Payer: Self-pay | Admitting: Obstetrics and Gynecology

## 2017-02-19 ENCOUNTER — Encounter: Payer: Self-pay | Admitting: Oncology

## 2017-02-19 ENCOUNTER — Encounter: Payer: Self-pay | Admitting: *Deleted

## 2017-02-19 ENCOUNTER — Inpatient Hospital Stay: Payer: Medicare Other | Attending: Oncology | Admitting: Oncology

## 2017-02-19 VITALS — BP 147/85 | HR 81 | Temp 97.8°F | Wt 138.3 lb

## 2017-02-19 DIAGNOSIS — Z8041 Family history of malignant neoplasm of ovary: Secondary | ICD-10-CM | POA: Diagnosis not present

## 2017-02-19 DIAGNOSIS — Z87891 Personal history of nicotine dependence: Secondary | ICD-10-CM

## 2017-02-19 DIAGNOSIS — J449 Chronic obstructive pulmonary disease, unspecified: Secondary | ICD-10-CM | POA: Diagnosis not present

## 2017-02-19 DIAGNOSIS — E079 Disorder of thyroid, unspecified: Secondary | ICD-10-CM | POA: Insufficient documentation

## 2017-02-19 DIAGNOSIS — C3412 Malignant neoplasm of upper lobe, left bronchus or lung: Secondary | ICD-10-CM | POA: Diagnosis not present

## 2017-02-19 DIAGNOSIS — I10 Essential (primary) hypertension: Secondary | ICD-10-CM | POA: Diagnosis not present

## 2017-02-19 DIAGNOSIS — N3941 Urge incontinence: Secondary | ICD-10-CM | POA: Insufficient documentation

## 2017-02-19 DIAGNOSIS — K5730 Diverticulosis of large intestine without perforation or abscess without bleeding: Secondary | ICD-10-CM | POA: Diagnosis not present

## 2017-02-19 DIAGNOSIS — Z7189 Other specified counseling: Secondary | ICD-10-CM

## 2017-02-19 DIAGNOSIS — J9611 Chronic respiratory failure with hypoxia: Secondary | ICD-10-CM

## 2017-02-19 DIAGNOSIS — Z79899 Other long term (current) drug therapy: Secondary | ICD-10-CM | POA: Diagnosis not present

## 2017-02-19 DIAGNOSIS — Z8701 Personal history of pneumonia (recurrent): Secondary | ICD-10-CM | POA: Diagnosis not present

## 2017-02-19 DIAGNOSIS — N952 Postmenopausal atrophic vaginitis: Secondary | ICD-10-CM | POA: Insufficient documentation

## 2017-02-19 DIAGNOSIS — I700 Atherosclerosis of aorta: Secondary | ICD-10-CM | POA: Diagnosis not present

## 2017-02-19 DIAGNOSIS — E042 Nontoxic multinodular goiter: Secondary | ICD-10-CM

## 2017-02-19 DIAGNOSIS — C3492 Malignant neoplasm of unspecified part of left bronchus or lung: Secondary | ICD-10-CM

## 2017-02-19 MED ORDER — ALPRAZOLAM 0.5 MG PO TABS: 0.5000 mg | ORAL_TABLET | Freq: Once | ORAL | 0 refills | Status: AC

## 2017-02-19 NOTE — Progress Notes (Signed)
Patient here today as a new patient.   Patient c/o SOB today.  O2 sat 86% during a 3 minute walk.

## 2017-02-19 NOTE — Progress Notes (Signed)
[facility] Initial Visit:  Patient Care Team: Glean Hess, MD as PCP - General (Family Medicine) Gabriel Carina, Betsey Holiday, MD as Physician Assistant (Endocrinology) Defrancesco, Alanda Slim, MD as Consulting Physician (Obstetrics and Gynecology)  CHIEF COMPLAINTS/PURPOSE OF CONSULTATION: Lung cancer  HISTORY OF PRESENTING ILLNESS: Paige Hart 82 y.o. female with PMH listed as below  is referred to cancer center for evaluation of newly diagnosed lung cancer and further management. She is a former smoker (1/2-1 PPD X greater than 40 yrs, quit 2005) recently treated for pneumonia, returned to ED with recurrent SOB and persistent opacity in LUL. She underwent CT chest which showed a LUL mass of 7.6cm and RUL mass with size of 1.1cm, as well left hilar and mediastinal lymphadenopathy. PET scan was done on 02/17/2017 which showed LUL, RUL mass are FDG avid as well as lymph node.  She underwent bronchoscopy on 02/12/2017 and had BAL and LUL mass biopsied. BAL revealed negative for malignancy. LUL biopsy showed adenocarcinoma.  Today patient is accompanied by her daughter [place] to her visit. Patient reports feeling shortness of breath more than her baseline. Patient is a former smoker and quit in 2005. She has COPD and uses inhalers at home. She is not on home oxygen. Denies headache or double vision, chest pain, back pain, abdominal pain, lower extremity swelling, fever or chills. At baseline patient has a pretty good performance status up until the episode of pneumonia. She was able to cook, do gardening, housework, she does not go out and walk too much due to the limitation of COPD.  Review of Systems  Constitutional: Negative.   HENT:  Negative.   Eyes: Negative.   Respiratory: Positive for cough and shortness of breath.   Cardiovascular: Negative.   Gastrointestinal: Negative.   Endocrine: Negative.   Genitourinary: Negative.    Musculoskeletal: Negative.   Skin: Negative.    Neurological: Negative.   Hematological: Negative.   Psychiatric/Behavioral: Negative.     MEDICAL HISTORY: Past Medical History:  Diagnosis Date  . Asthma   . Colitis    left sided  . Cystocele   . Emphysema/COPD ([place])   . Hypertension   . Procidentia of uterus   . Thyroid disease   . Urge incontinence   . Vaginal atrophy   . Wears dentures    full upper and lower    SURGICAL HISTORY: Past Surgical History:  Procedure Laterality Date  . COLONOSCOPY WITH PROPOFOL N/A 08/11/2016   Procedure: COLONOSCOPY WITH PROPOFOL;  Surgeon: [name], MD;  Location: [facility];  Service: Endoscopy;  Laterality: N/A;  . FLEXIBLE BRONCHOSCOPY N/A 02/12/2017   Procedure: FLEXIBLE BRONCHOSCOPY;  Surgeon: Wilhelmina Mcardle, MD;  Location: [facility];  Service: Pulmonary;  Laterality: N/A;  . OOPHORECTOMY Left 1970  . POLYPECTOMY  08/11/2016   Procedure: POLYPECTOMY;  Surgeon: [name], MD;  Location: [place];  Service: Endoscopy;;    SOCIAL HISTORY: Social History   Social History  . Marital status: Widowed    Spouse name: N/A  . Number of children: N/A  . Years of education: N/A   Occupational History  . Not on file.   Social History Main Topics  . Smoking status: Former Smoker    Quit date: 07/28/2004  . Smokeless tobacco: Never Used  . Alcohol use No  . Drug use: No  . Sexual activity: Not Currently   Other Topics Concern  . Not on file   Social History Narrative  .  No narrative on file    FAMILY HISTORY Family History  Problem Relation Age of Onset  . Ovarian cancer Mother   . Lung cancer Father   . Mental illness Sister   . Diabetes Neg Hx   . Heart disease Neg Hx   . Breast cancer Neg Hx   . Colon cancer Neg Hx     ALLERGIES:  has No Known Allergies.  MEDICATIONS:  Current Outpatient Prescriptions  Medication Sig Dispense Refill  . acetaminophen (TYLENOL) 500 MG tablet Take 1,000 mg by mouth every 6 (six) hours as needed (for  pain.).    Marland Kitchen albuterol (PROVENTIL HFA;VENTOLIN HFA) 108 (90 Base) MCG/ACT inhaler Inhale 2 puffs into the lungs every 4 (four) hours as needed for wheezing or shortness of breath. 1 Inhaler 12  . budesonide (ENTOCORT EC) 3 MG 24 hr capsule Take 3 capsules (9 mg total) by mouth every morning. 90 capsule 1  . budesonide-formoterol (SYMBICORT) 160-4.5 MCG/ACT inhaler Inhale 2 puffs into the lungs 2 (two) times daily. 10.2 g 12  . docusate sodium (COLACE) 100 MG capsule Take 1 capsule (100 mg total) by mouth every other day. (Patient taking differently: Take 100 mg by mouth daily. ) 30 capsule 2  . ESTRACE VAGINAL 0.1 MG/GM vaginal cream INSERT 0.5GRAMS VAGINALLY ONCE WEEKLY (Patient taking differently: INSERT 0.5GRAMS VAGINALLY twice weekly on monday and friday) 45 g 1  . guaiFENesin-dextromethorphan (ROBITUSSIN DM) 100-10 MG/5ML syrup Take 5 mLs by mouth every 4 (four) hours as needed for cough. 118 mL 0  . levofloxacin (LEVAQUIN) 500 MG tablet Take 1 tablet (500 mg total) by mouth daily. 4 tablet 0  . losartan (COZAAR) 100 MG tablet Take 1 tablet (100 mg total) by mouth daily. 90 tablet 3  . MULTIPLE VITAMINS-MINERALS PO Take 1 tablet by mouth daily.    . predniSONE (DELTASONE) 10 MG tablet 4 tabs po daily for 4 days (Patient taking differently: Take 40 mg by mouth daily. ) 16 tablet 0   No current facility-administered medications for this visit.     PHYSICAL EXAMINATION:  ECOG PERFORMANCE STATUS: 1 - Symptomatic but completely ambulatory   Vitals:   02/19/17 1005  BP: (!) 147/85  Pulse: 81  Temp: 97.8 F (36.6 C)    Filed Weights   02/19/17 1005  Weight: 138 lb 5.4 oz (62.8 kg)     Physical Exam GENERAL: No distress, well nourished.  SKIN:  No rashes or significant lesions  HEAD: Normocephalic, No masses, lesions, tenderness or abnormalities  EYES: Conjunctiva are pink, non icteric ENT: External ears normal ,lips , buccal mucosa, and tongue normal and mucous membranes are  moist  LYMPH: No palpable cervical and axillary lymphadenopathy  LUNGS: no crackles or wheezes, decreased breath sound left upper.  HEART: Regular rate & rhythm, no murmurs, no gallops, S1 normal and S2 normal  ABDOMEN: Abdomen soft, non-tender, normal bowel sounds, I did not appreciate any  masses or organomegaly  MUSCULOSKELETAL: No CVA tenderness and no tenderness on percussion of the back or rib cage.  EXTREMITIES: No edema, no skin discoloration or tenderness NEURO: Alert & oriented, no focal motor/sensory deficits.    LABORATORY DATA: I have personally reviewed the data as listed: Surgical Pathology  CASE: [id number]  PATIENT: Rada Hay  Surgical Pathology Report  SPECIMEN SUBMITTED:  A. Lung, left upper lobe, transbronchial biopsy  CLINICAL HISTORY:  None provided  PRE-OPERATIVE DIAGNOSIS:  Lung mass, left upper lobe  POST-OPERATIVE DIAGNOSIS:  None provided.  DIAGNOSIS:  A. LUNG, LEFT UPPER LOBE; TRANSBRONCHIAL BIOPSY:  - ADENOCARCINOMA.  Comment:  Adenocarcinoma is present, with architectural complexity and papillary  features consistent with invasive disease. Tumor measures 2 mm in this  sample, and there may be enough for ancillary studies.  The diagnosis was called to Derrek Gu in Dr. Alva Garnet' office on  02/13/17 at 4:34 PM. Read-back was performed.      RADIOGRAPHIC STUDIES: I have personally reviewed the radiological images as listed and agree with the findings in the report CT Angio Chest PE 02/07/2017  IMPRESSION: 1. 7.6 x 6.9 cm rounded area of dense, confluent consolidation in the left upper lobe with less pronounced patchy opacity distal to this area in the majority of the left upper lobe. This is compatible with dense, consolidative pneumonia and less dense pneumonia more distally in the left upper lobe. An underlying mass with peripheral postobstructive changes cannot be excluded. 2. 1.1 x 1.0 cm mildly irregular and spiculated nodule in  the right upper lobe. This has CT features suspicious for a primary lung carcinoma. 3. Mediastinal and left hilar adenopathy. This is most likely reactive adenopathy associated with left upper lobe pneumonia. If there is an underlying neoplasm, this could represent metastatic adenopathy. 4. COPD with centrilobular emphysema. 5. Aortic atherosclerosis. 6. Multinodular thyroid goiter. The largest nodule measures 1.7 cm. Consider further evaluation with thyroid ultrasound. If patient is clinically hyperthyroid, consider nuclear medicine thyroid uptake and scan.  PET 02/17/2017 IMPRESSION: 1. Hypermetabolic consolidation/mass in the left upper lobe, 7.6 cm in long axis, favoring malignancy. Associated postobstructive pneumonitis. There is hypermetabolic left hilar and AP window adenopathy, and a hypermetabolic contralateral 1.2 cm right upper lobe pulmonary nodule. Assuming non-small cell lung cancer the appearance is compatible with T3 N2 M1 disease (stage IV). Tissue diagnosis recommended if not already obtained. 2.  Aortic Atherosclerosis (ICD10-I70.0). 3. Sigmoid colon diverticulosis.   ASSESSMENT/PLAN Cancer Staging Primary adenocarcinoma of upper lobe of left lung ([place]) Staging form: Lung, AJCC 8th Edition - Clinical stage from 02/19/2017: Stage IVA (cT4(m), cN2, cM1a) - Unsigned  1. Adenocarcinoma of left lung ([place])   2. Chronic respiratory failure with hypoxia (H[facility])   3. Goals of care, counseling/discussion   . # I had a lengthy discussion with patient and her daughter regarding patient's imaging studies and a biopsy results. I discussed with him regarding her cancer diagnosis, extent of the disease, and the prognosis. Ideally I would recommend her to have a CT-guided biopsy of her right upper lobe mass to establish biopsy proven M1a disease. Patient tells me that she is not interested for another invasive procedure at this point. Clinically with the data we have she is a  stage IVA  Lung adenocarcinoma.   # Send molecular studies and PD-L1. Patient and her daughter asked about treatment options. I discussed with them that we will need to wait for the molecular studies and PD L1 results before we pain down the treatment plan. We will obtain foundation one test which will take about 2 weeks.  If she does not have any targetable mutations,  Plan combination of chemotherapy and immunotherapy.   # Goal of treatment is palliative, discussed with patient and her daughter.   # MRI Brain to evaluate any CNS involvement.   # She has pulse ox lower than 88% with 3 min walking. Will obtain home oxygen for her.   # Refer to Dr.Chrystal for evaluation of palliative RT to open her left lung.   Follow up w me  after foundation one test resulted. Meanwhile patient requests second opinion from [facility], will refer.   All questions were answered. The patient knows to call the clinic with any problems, questions or concerns. Thank you for this kind referral and the opportunity to participate in the care of this patient. A copy of today's note will be routed to referring physician Dr Alva Garnet.  Dr. Earlie Server, MD, PhD [facility] at [facility] Pager- [phone] 02/19/2017

## 2017-02-19 NOTE — Progress Notes (Signed)
  Oncology Nurse Navigator Documentation  Navigator Location: [facility] (02/19/17 1100)   )Navigator Encounter Type: Initial MedOnc (02/19/17 1100)     Confirmed Diagnosis Date: 02/13/17 (02/19/17 1100)                   Barriers/Navigation Needs: Coordination of Care (02/19/17 1100)   Interventions: Coordination of Care (02/19/17 1100)   Coordination of Care: Appts;Radiology (02/19/17 1100)       met with patient during initial consultation with Dr. Tasia Catchings for lung cancer. All questions answered at the time of visit. Assisted patient with appts and sending referral to [facility] for second opinion. All upcoming appts reviewed with the patient. Supportive services and educational materials given to patient. Contact info given and instructed to call with further questions. Pt verbalized understanding.            Time Spent with Patient: 60 (02/19/17 1100)

## 2017-02-20 ENCOUNTER — Encounter: Payer: Self-pay | Admitting: *Deleted

## 2017-02-20 ENCOUNTER — Other Ambulatory Visit: Payer: Self-pay | Admitting: *Deleted

## 2017-02-20 MED ORDER — ALPRAZOLAM 0.25 MG PO TABS: 0.2500 mg | ORAL_TABLET | Freq: Every evening | ORAL | 0 refills | Status: DC | PRN

## 2017-02-20 MED ORDER — ALPRAZOLAM 0.25 MG PO TABS: ORAL_TABLET | ORAL | 0 refills | Status: DC

## 2017-02-20 NOTE — Patient Instructions (Signed)
RA O2 sats at rest: 91% RA O2 sats with ambulation/exertion: 87% 2L O2 at rest: 95% 2L O2 with ambulation/exertion: 97%

## 2017-02-20 NOTE — Telephone Encounter (Signed)
-----   Message from Secundino Ginger sent at 02/20/2017 12:09 PM EDT ----- Regarding: Paige Hart, I just remembered that this pt told me she was clostrophobic for MRI (prolly spelled wrong) can you send her something to pharmacy. Let me know and I'll call her.  Sorry I forgot yesterday.

## 2017-02-20 NOTE — Progress Notes (Signed)
  Oncology Nurse Navigator Documentation  Navigator Location: [facility] (02/20/17 0900)   )Navigator Encounter Type: Telephone (02/20/17 0900) Telephone: Lahoma Crocker Call;Appt Confirmation/Clarification (02/20/17 0900)            returned phone call to pt's daughter regarding handicap parking placard. Informed will mail signed application form to her house and then she would need to take to [facility]. Also, informed pt that has appt with Dr. Baruch Gouty on Monday 8/6 at 9am to discuss possibility of palliative radiation to lung mass to help open airway and relieve shortness of breath. Pt's daughter stated will discuss with family and patient. Updated pt and family that all her records have been faxed to [facility] for second opinion. Once reviewed, [facility] will notify our office with an appointment. Informed pt and family that I would let her know once I hear back from [facility]. Pt and family verbalized understanding.                                       Time Spent with Patient: 15 (02/20/17 0900)

## 2017-02-20 NOTE — Telephone Encounter (Signed)
Per Dr Tasia Catchings, Faythe Ghee to send 0.25 xanax #1, 0 refills to pharmacy for MRI scan

## 2017-02-24 ENCOUNTER — Inpatient Hospital Stay: Payer: Medicare Other | Admitting: Internal Medicine

## 2017-02-25 ENCOUNTER — Encounter: Payer: Self-pay | Admitting: *Deleted

## 2017-02-25 NOTE — Progress Notes (Signed)
  Oncology Nurse Navigator Documentation  Navigator Location: [facility] (02/25/17 0900)   )Navigator Encounter Type: Telephone (02/25/17 0900) Telephone: Lahoma Crocker Call (02/25/17 0900)                       Barriers/Navigation Needs: No barriers at this time (02/25/17 0900)   Interventions: None required (02/25/17 0900)           follow up phone call made to patient. Pt stated has appt scheduled with [facility] on 8/15. At this time, she would like to cancel appt with Dr. Baruch Gouty on 8/6. Pt stated she would rather wait to discuss radiation treatment at her appt with [facility]. Pt reminded of upcoming appts for brain MRI on 8/2 and follow up visit with Dr. Tasia Catchings on 8/16. Instructed pt to contact me if has any further needs or questions. Pt verbalized understanding.           Time Spent with Patient: 15 (02/25/17 0900)

## 2017-02-26 ENCOUNTER — Inpatient Hospital Stay
Admission: EM | Admit: 2017-02-26 | Discharge: 2017-02-27 | DRG: 065 | Disposition: A | Discharge status: Home / Self Care | Payer: Medicare Other | Attending: Internal Medicine | Admitting: Internal Medicine

## 2017-02-26 ENCOUNTER — Encounter: Payer: Self-pay | Admitting: Emergency Medicine

## 2017-02-26 ENCOUNTER — Ambulatory Visit
Admission: RE | Admit: 2017-02-26 | Discharge: 2017-02-26 | Disposition: A | Discharge status: Home / Self Care | Payer: Medicare Other | Source: Ambulatory Visit | Attending: Oncology | Admitting: Oncology

## 2017-02-26 DIAGNOSIS — I6523 Occlusion and stenosis of bilateral carotid arteries: Secondary | ICD-10-CM | POA: Diagnosis present

## 2017-02-26 DIAGNOSIS — R918 Other nonspecific abnormal finding of lung field: Secondary | ICD-10-CM | POA: Diagnosis not present

## 2017-02-26 DIAGNOSIS — I639 Cerebral infarction, unspecified: Principal | ICD-10-CM | POA: Diagnosis present

## 2017-02-26 DIAGNOSIS — G9389 Other specified disorders of brain: Secondary | ICD-10-CM

## 2017-02-26 DIAGNOSIS — I6389 Other cerebral infarction: Secondary | ICD-10-CM

## 2017-02-26 DIAGNOSIS — Z7982 Long term (current) use of aspirin: Secondary | ICD-10-CM | POA: Diagnosis not present

## 2017-02-26 DIAGNOSIS — Z9981 Dependence on supplemental oxygen: Secondary | ICD-10-CM | POA: Diagnosis not present

## 2017-02-26 DIAGNOSIS — E0500 Thyrotoxicosis with diffuse goiter without thyrotoxic crisis or storm: Secondary | ICD-10-CM | POA: Diagnosis present

## 2017-02-26 DIAGNOSIS — Z8041 Family history of malignant neoplasm of ovary: Secondary | ICD-10-CM | POA: Diagnosis not present

## 2017-02-26 DIAGNOSIS — Z87891 Personal history of nicotine dependence: Secondary | ICD-10-CM | POA: Diagnosis not present

## 2017-02-26 DIAGNOSIS — Z801 Family history of malignant neoplasm of trachea, bronchus and lung: Secondary | ICD-10-CM | POA: Diagnosis not present

## 2017-02-26 DIAGNOSIS — D6869 Other thrombophilia: Secondary | ICD-10-CM | POA: Diagnosis present

## 2017-02-26 DIAGNOSIS — C7931 Secondary malignant neoplasm of brain: Secondary | ICD-10-CM | POA: Diagnosis present

## 2017-02-26 DIAGNOSIS — J449 Chronic obstructive pulmonary disease, unspecified: Secondary | ICD-10-CM | POA: Diagnosis not present

## 2017-02-26 DIAGNOSIS — J439 Emphysema, unspecified: Secondary | ICD-10-CM | POA: Diagnosis present

## 2017-02-26 DIAGNOSIS — K5150 Left sided colitis without complications: Secondary | ICD-10-CM | POA: Diagnosis present

## 2017-02-26 DIAGNOSIS — Z8719 Personal history of other diseases of the digestive system: Secondary | ICD-10-CM

## 2017-02-26 DIAGNOSIS — Z8673 Personal history of transient ischemic attack (TIA), and cerebral infarction without residual deficits: Secondary | ICD-10-CM | POA: Diagnosis present

## 2017-02-26 DIAGNOSIS — Z79899 Other long term (current) drug therapy: Secondary | ICD-10-CM | POA: Diagnosis not present

## 2017-02-26 DIAGNOSIS — C3490 Malignant neoplasm of unspecified part of unspecified bronchus or lung: Secondary | ICD-10-CM | POA: Diagnosis present

## 2017-02-26 DIAGNOSIS — C719 Malignant neoplasm of brain, unspecified: Secondary | ICD-10-CM | POA: Diagnosis not present

## 2017-02-26 DIAGNOSIS — C3492 Malignant neoplasm of unspecified part of left bronchus or lung: Secondary | ICD-10-CM

## 2017-02-26 DIAGNOSIS — R29700 NIHSS score 0: Secondary | ICD-10-CM | POA: Diagnosis present

## 2017-02-26 DIAGNOSIS — I10 Essential (primary) hypertension: Secondary | ICD-10-CM | POA: Diagnosis present

## 2017-02-26 DIAGNOSIS — I638 Other cerebral infarction: Secondary | ICD-10-CM | POA: Diagnosis not present

## 2017-02-26 HISTORY — DX: Malignant neoplasm of unspecified part of unspecified bronchus or lung: C34.90

## 2017-02-26 LAB — CBC
HEMATOCRIT: 37.4 % (ref 35.0–47.0)
Hemoglobin: 12.3 g/dL (ref 12.0–16.0)
MCH: 29 pg (ref 26.0–34.0)
MCHC: 33 g/dL (ref 32.0–36.0)
MCV: 88.1 fL (ref 80.0–100.0)
Platelets: 272 10*3/uL (ref 150–440)
RBC: 4.25 MIL/uL (ref 3.80–5.20)
RDW: 14.3 % (ref 11.5–14.5)
WBC: 10.1 10*3/uL (ref 3.6–11.0)

## 2017-02-26 LAB — DIFFERENTIAL
BASOS ABS: 0.1 10*3/uL (ref 0–0.1)
BASOS PCT: 1 %
Eosinophils Absolute: 0.3 10*3/uL (ref 0–0.7)
Eosinophils Relative: 3 %
LYMPHS PCT: 12 %
Lymphs Abs: 1.2 10*3/uL (ref 1.0–3.6)
MONO ABS: 1 10*3/uL — AB (ref 0.2–0.9)
MONOS PCT: 10 %
NEUTROS ABS: 7.5 10*3/uL — AB (ref 1.4–6.5)
Neutrophils Relative %: 74 %

## 2017-02-26 LAB — APTT: APTT: 26 s (ref 24–36)

## 2017-02-26 LAB — COMPREHENSIVE METABOLIC PANEL
ALK PHOS: 64 U/L (ref 38–126)
ALT: 16 U/L (ref 14–54)
AST: 20 U/L (ref 15–41)
Albumin: 3.9 g/dL (ref 3.5–5.0)
Anion gap: 8 (ref 5–15)
BUN: 17 mg/dL (ref 6–20)
CALCIUM: 9.2 mg/dL (ref 8.9–10.3)
CHLORIDE: 103 mmol/L (ref 101–111)
CO2: 28 mmol/L (ref 22–32)
CREATININE: 0.98 mg/dL (ref 0.44–1.00)
GFR calc non Af Amer: 53 mL/min — ABNORMAL LOW (ref >60)
Glucose, Bld: 108 mg/dL — ABNORMAL HIGH (ref 65–99)
Potassium: 4.4 mmol/L (ref 3.5–5.1)
SODIUM: 139 mmol/L (ref 135–145)
Total Bilirubin: 0.4 mg/dL (ref 0.3–1.2)
Total Protein: 7.4 g/dL (ref 6.5–8.1)

## 2017-02-26 LAB — PROTIME-INR
INR: 0.92
Prothrombin Time: 12.4 seconds (ref 11.4–15.2)

## 2017-02-26 LAB — TROPONIN I: Troponin I: <0.03 ng/mL (ref <0.03)

## 2017-02-26 MED ORDER — ALBUTEROL SULFATE (2.5 MG/3ML) 0.083% IN NEBU
3.0000 mL | INHALATION_SOLUTION | RESPIRATORY_TRACT | Status: DC | PRN
  Administered 2017-02-26 – 2017-02-27 (×3): 3 mL via RESPIRATORY_TRACT
  Filled 2017-02-26 (×3): qty 3

## 2017-02-26 MED ORDER — MOMETASONE FURO-FORMOTEROL FUM 200-5 MCG/ACT IN AERO
2.0000 | INHALATION_SPRAY | Freq: Two times a day (BID) | RESPIRATORY_TRACT | Status: DC
  Administered 2017-02-26 – 2017-02-27 (×2): 2 via RESPIRATORY_TRACT
  Filled 2017-02-26: qty 8.8

## 2017-02-26 MED ORDER — SENNOSIDES-DOCUSATE SODIUM 8.6-50 MG PO TABS: 1.0000 | ORAL_TABLET | Freq: Every evening | ORAL | Status: DC | PRN

## 2017-02-26 MED ORDER — BUDESONIDE 3 MG PO CPEP
9.0000 mg | ORAL_CAPSULE | ORAL | Status: DC
  Administered 2017-02-27: 9 mg via ORAL
  Filled 2017-02-26: qty 3

## 2017-02-26 MED ORDER — ASPIRIN 81 MG PO CHEW
324.0000 mg | CHEWABLE_TABLET | Freq: Once | ORAL | Status: AC
  Administered 2017-02-26: 324 mg via ORAL
  Filled 2017-02-26: qty 4

## 2017-02-26 MED ORDER — GADOBENATE DIMEGLUMINE 529 MG/ML IV SOLN
15.0000 mL | Freq: Once | INTRAVENOUS | Status: AC | PRN
  Administered 2017-02-26: 12 mL via INTRAVENOUS

## 2017-02-26 MED ORDER — SIMVASTATIN 20 MG PO TABS
40.0000 mg | ORAL_TABLET | Freq: Every day | ORAL | Status: DC
  Administered 2017-02-26: 40 mg via ORAL
  Filled 2017-02-26: qty 2

## 2017-02-26 MED ORDER — ENOXAPARIN SODIUM 40 MG/0.4ML ~~LOC~~ SOLN
40.0000 mg | SUBCUTANEOUS | Status: DC
  Administered 2017-02-26: 23:00:00 40 mg via SUBCUTANEOUS
  Filled 2017-02-26: qty 0.4

## 2017-02-26 MED ORDER — ASPIRIN EC 325 MG PO TBEC
325.0000 mg | DELAYED_RELEASE_TABLET | Freq: Every day | ORAL | Status: DC
  Administered 2017-02-27: 325 mg via ORAL
  Filled 2017-02-26: qty 1

## 2017-02-26 MED ORDER — STROKE: EARLY STAGES OF RECOVERY BOOK
Freq: Once | Status: AC
  Administered 2017-02-26: 23:00:00

## 2017-02-26 MED ORDER — LOSARTAN POTASSIUM 50 MG PO TABS
100.0000 mg | ORAL_TABLET | Freq: Every day | ORAL | Status: DC
  Administered 2017-02-27: 100 mg via ORAL
  Filled 2017-02-26 (×2): qty 2

## 2017-02-26 NOTE — ED Provider Notes (Signed)
[facility] Emergency Department Provider Note   ____________________________________________   First MD Initiated Contact with Patient 02/26/17 1536     (approximate)  I have reviewed the triage vital signs and the nursing notes.   HISTORY  Chief Complaint Abnormal Lab    HPI Paige Hart is a 82 y.o. female here for evaluation of a possible stroke on MRI  Patient denies MRI today, being evaluated for lung cancer. She was called and told that her MRI showed a stroke and a brain tumor.  She reports she has not noticed a symptoms of stroke. No change in speech. No nausea or vomiting. No numbness or weakness. No facial droop. She is unaware of the symptoms.  She does not take any blood thinners. Currently takes medicine for hypertension. No previous history of stroke.  Does report last night for a few minutes during the evening she felt like she had tingling but it was in both arms and hands and went away after taking Tylenol.   Past Medical History:  Diagnosis Date  . Asthma   . Colitis    left sided  . Cystocele   . Emphysema/COPD ([place])   . Hypertension   . Lung cancer ([place])   . Procidentia of uterus   . Urge incontinence   . Vaginal atrophy   . Wears dentures    full upper and lower    Patient Active Problem List   Diagnosis Date Noted  . Goals of care, counseling/discussion 02/19/2017  . Primary adenocarcinoma of upper lobe of left lung ([place]) 02/14/2017  . SOB (shortness of breath) 02/07/2017  . Ulcerative colitis ([place]) 09/02/2016  . Blood in stool   . Rectal polyp   . Polyp of sigmoid colon   . Benign neoplasm of descending colon   . Benign neoplasm of transverse colon   . Benign neoplasm of cecum   . BRBPR (bright red blood per rectum) 08/05/2016  . Chronic idiopathic constipation 07/09/2016  . Acute cystitis with hematuria 07/09/2016  . Bleeding hemorrhoid 07/03/2016  . Cystocele 12/27/2015  . Uterine procidentia  12/27/2015  . Pessary maintenance 03/22/2015  . Allergic conjunctivitis 11/17/2014  . Allergic rhinitis 11/17/2014  . Essential (primary) hypertension 11/17/2014  . Graves disease 11/17/2014  . Bursitis of hip 11/17/2014  . Combined fat and carbohydrate induced hyperlipemia 11/17/2014  . Chronic obstructive pulmonary emphysema ([place]) 11/17/2014  . Pelvic relaxation due to uterovaginal prolapse - pessary use 11/17/2014    Past Surgical History:  Procedure Laterality Date  . COLONOSCOPY WITH PROPOFOL N/A 08/11/2016   Procedure: COLONOSCOPY WITH PROPOFOL;  Surgeon: [name], MD;  Location: [facility];  Service: Endoscopy;  Laterality: N/A;  . FLEXIBLE BRONCHOSCOPY N/A 02/12/2017   Procedure: FLEXIBLE BRONCHOSCOPY;  Surgeon: Wilhelmina Mcardle, MD;  Location: [facility];  Service: Pulmonary;  Laterality: N/A;  . OOPHORECTOMY Left 1970  . POLYPECTOMY  08/11/2016   Procedure: POLYPECTOMY;  Surgeon: [name], MD;  Location: [facility];  Service: Endoscopy;;    Prior to Admission medications   Medication Sig Start Date End Date Taking? Authorizing Provider  acetaminophen (TYLENOL) 500 MG tablet Take 1,000 mg by mouth every 6 (six) hours as needed (for pain.).   Yes [provider]  albuterol (PROVENTIL HFA;VENTOLIN HFA) 108 (90 Base) MCG/ACT inhaler Inhale 2 puffs into the lungs every 4 (four) hours as needed for wheezing or shortness of breath. 03/27/16  Yes Glean Hess, MD  budesonide (ENTOCORT EC) 3 MG  24 hr capsule Take 3 capsules (9 mg total) by mouth every morning. 02/05/17  Yes [name], MD  budesonide-formoterol [facility]) 160-4.5 MCG/ACT inhaler Inhale 2 puffs into the lungs 2 (two) times daily. 03/27/16  Yes Glean Hess, MD  docusate sodium (COLACE) 100 MG capsule Take 1 capsule (100 mg total) by mouth every other day. Patient taking differently: Take 100 mg by mouth daily.  07/07/16 07/07/17 Yes Paduchowski, Lennette Bihari, MD  ESTRACE VAGINAL 0.1 MG/GM  vaginal cream INSERT 0.5GRAMS VAGINALLY ONCE WEEKLY Patient taking differently: INSERT 0.5GRAMS VAGINALLY twice weekly on monday and friday 08/07/15  Yes Defrancesco, Alanda Slim, MD  losartan (COZAAR) 100 MG tablet Take 1 tablet (100 mg total) by mouth daily. 07/03/16  Yes Glean Hess, MD  MULTIPLE VITAMINS-MINERALS PO Take 1 tablet by mouth daily.   Yes [provider]  ALPRAZolam (XANAX) 0.25 MG tablet Take 1 tablet 20-30 minutes prior to MRI. Patient not taking: Reported on 02/26/2017 02/20/17   Earlie Server, MD  guaiFENesin-dextromethorphan [facility] DM) 100-10 MG/5ML syrup Take 5 mLs by mouth every 4 (four) hours as needed for cough. Patient not taking: Reported on 02/26/2017 01/25/17   Dustin Flock, MD    Allergies Patient has no known allergies.  Family History  Problem Relation Age of Onset  . Ovarian cancer Mother   . Lung cancer Father   . Diabetes Neg Hx   . Heart disease Neg Hx   . Breast cancer Neg Hx   . Colon cancer Neg Hx     Social History Social History  Substance Use Topics  . Smoking status: Former Smoker    Quit date: 07/28/2004  . Smokeless tobacco: Never Used  . Alcohol use No    Review of Systems Constitutional: No fever/chills Eyes: No visual changes. ENT: No sore throat. Cardiovascular: Denies chest pain. Respiratory: Denies shortness of breath. Gastrointestinal: No abdominal pain.  No nausea, no vomiting.  No diarrhea.  No constipation. Genitourinary: Negative for dysuria. Musculoskeletal: Negative for back pain. Skin: Negative for rash. Neurological: Negative for headaches, focal weakness or numbness.    ____________________________________________   PHYSICAL EXAM:  VITAL SIGNS: ED Triage Vitals  Enc Vitals Group     BP 02/26/17 1508 (!) 143/76     Pulse Rate 02/26/17 1508 85     Resp 02/26/17 1508 (!) 24     Temp 02/26/17 1508 98.3 F (36.8 C)     Temp Source 02/26/17 1508 Oral     SpO2 02/26/17 1508 94 %     Weight 02/26/17  1509 138 lb (62.6 kg)     Height 02/26/17 1509 _0  (1.626 m)     Head Circumference --      Peak Flow --      Pain Score --      Pain Loc --      Pain Edu? --      Excl. in [place]? --     Constitutional: Alert and oriented. Well appearing and in no acute distress. Eyes: Conjunctivae are normal. Head: Atraumatic. Nose: No congestion/rhinnorhea. Mouth/Throat: Mucous membranes are moist. Neck: No stridor.   Cardiovascular: Normal rate, regular rhythm. Grossly normal heart sounds.  Good peripheral circulation. Respiratory: Normal respiratory effort.  No retractions. Lungs CTAB. Gastrointestinal: Soft and nontender. No distention. Musculoskeletal: No lower extremity tenderness nor edema. Neurologic:  Normal speech and language. No gross focal neurologic deficits are appreciated.   NIH score equals 0, performed by me at bedside. The patient has no pronator  drift. The patient has normal cranial nerve exam. Extraocular movements are normal. Visual fields are normal. Patient has 5 out of 5 strength in all extremities. There is no numbness or gross, acute sensory abnormality in the extremities bilaterally. No speech disturbance. No dysarthria. No aphasia. No ataxia. Normal finger nose finger bilat. Patient speaking in full and clear sentences.  Skin:  Skin is warm, dry and intact. No rash noted. Psychiatric: Mood and affect are normal. Speech and behavior are normal.  ____________________________________________   LABS (all labs ordered are listed, but only abnormal results are displayed)  Labs Reviewed  DIFFERENTIAL - Abnormal; Notable for the following:       Result Value   Neutro Abs 7.5 (*)    Monocytes Absolute 1.0 (*)    All other components within normal limits  COMPREHENSIVE METABOLIC PANEL - Abnormal; Notable for the following:    Glucose, Bld 108 (*)    GFR calc non Af Amer 53 (*)    All other components within normal limits  PROTIME-INR  APTT  CBC  TROPONIN I    CBG MONITORING, ED   ____________________________________________  EKG  ED ECG REPORT I, Palyn Scrima, the attending physician, personally viewed and interpreted this ECG.  Date: 02/26/2017 EKG Time: 1515 Rate: 80 Rhythm: normal sinus rhythm QRS Axis: normal Intervals: normal ST/T Wave abnormalities: normal Narrative Interpretation: unremarkable  ____________________________________________  RADIOLOGY  Mr Jeri Cos Wo Contrast  Result Date: 02/26/2017 CLINICAL DATA:  New diagnosis lung cancer.  Staging. EXAM: MRI HEAD WITHOUT AND WITH CONTRAST TECHNIQUE: Multiplanar, multiecho pulse sequences of the brain and surrounding structures were obtained without and with intravenous contrast. CONTRAST:  53m MULTIHANCE GADOBENATE DIMEGLUMINE 529 MG/ML IV SOLN COMPARISON:  None. FINDINGS: Brain: Diffusion imaging shows a 1.3 cm region of acute or subacute infarction in the deep white matter of the right parietal lobe. No other foci of restricted diffusion. There are chronic small-vessel ischemic changes affecting the brainstem. No focal cerebellar insult. Cerebral hemispheres show moderate to marked chronic small-vessel ischemic changes within the white matter bilaterally. After contrast administration, there is minimal linear enhancement in the right parietal white matter subacute infarction, but not an enhancement pattern suggestive of metastatic disease. There is an enhancing lesion in the medial left temporal lobe measuring 5 mm in diameter consistent with a metastasis. No second metastasis is identified. No hydrocephalus. No extra-axial collection. Prominent vein along the right tentorium simulates an enhancing nodule but I think is a tubular structure. Vascular: Major vessels at the base of the brain show flow. Skull and upper cervical spine: Negative Sinuses/Orbits: Clear/normal Other: None significant IMPRESSION: 5 mm metastasis in the medial left temporal lobe. No second metastasis identified.  1.3 cm acute/subacute deep white matter infarction in the right parietal region. Moderate chronic small-vessel ischemic changes elsewhere throughout the brain. Electronically Signed   By: MNelson ChimesM.D.   On: 02/26/2017 11:45    ____________________________________________   PROCEDURES  Procedure(s) performed: None  Procedures  Critical Care performed: No  ____________________________________________   INITIAL IMPRESSION / ASSESSMENT AND PLAN / ED COURSE  Pertinent labs & imaging results that were available during my care of the patient were reviewed by me and considered in my medical decision making (see chart for details).  Patient Denies any acute stroke symptoms, but MRI discussed with Dr. RDoy Mincewho recommends admission for stroke workup. I think this is very reasonable as MRI does seem just demonstrated an acute or subacute stroke. We'll provide aspirin as  advised by neurology. Admit to hospital.      ____________________________________________   FINAL CLINICAL IMPRESSION(S) / ED DIAGNOSES  Final diagnoses:  Infarction of parietal lobe (HCC)  Brain mass      NEW MEDICATIONS STARTED DURING THIS VISIT:  New Prescriptions   No medications on file     Note:  This document was prepared using Dragon voice recognition software and may include unintentional dictation errors.     Delman Kitten, MD 02/26/17 [phone]

## 2017-02-26 NOTE — ED Triage Notes (Signed)
Patient presents to the ED after an MRI today.  Patient states she was getting a pedicure when she received a call from the radiologist instructing patient to come to the ED.  MRI showed a possible active or subacute infarct.  Patient reports bilateral limb weakness. Patient's smile is symmetrical, grip strength is strong and equal, pedal and dorsi flexion and extension is intact, no limb drift noted at this time.  Patient denies new or increased shortness of breath.  Patient was having the MRI in order to help stage her cancer that she was recently diagnosed with.  Patient has been told she has lung cancer and uses 2L of oxygen at baseline.  Patient denies starting any treatment for her cancer at this time.

## 2017-02-26 NOTE — Progress Notes (Signed)
Family Meeting Note  Advance Directive:yes  Today a meeting took place with the Patient.     The following clinical team members were present during this meeting:MD  The following were discussed:Patient's diagnosis: , Patient's progosis: Unable to determine and Goals for treatment: Full Code, Son and daughter-in-law are [facility] Not considering palliative  care  Additional follow-up to be provided: hospitalist and neurologist  Time spent during discussion:16 min  Brentley Landfair, Illene Silver, MD

## 2017-02-26 NOTE — H&P (Signed)
[facility] [place] NAME: Paige Hart    MR#:  [id number]  DATE OF BIRTH:  Feb 01, 1935  DATE OF ADMISSION:  02/26/2017  PRIMARY CARE PHYSICIAN: Glean Hess, MD   REQUESTING/REFERRING PHYSICIAN: Delman Kitten, MD  CHIEF COMPLAINT:  abnml MRI   HISTORY OF PRESENT ILLNESS:  Paige Hart  is a 82 y.o. female with a known history of COPD, hypertension, recent diagnosis of stage IV lung cancer on 2 L of home oxygen via nasal cannula has had MRI of the brain done today as part of the lung cancer workup. MRI has revealed acute versus subacute right parietal region infarct and brain met. On call neurologist has recommended to admit the patient for complete stroke workup. Patient denies any headache or blurry vision, denies any extremity weakness. Patient denies any unsteady gait  PAST MEDICAL HISTORY:   Past Medical History:  Diagnosis Date  . Asthma   . Colitis    left sided  . Cystocele   . Emphysema/COPD ([place])   . Hypertension   . Lung cancer ([place])   . Procidentia of uterus   . Urge incontinence   . Vaginal atrophy   . Wears dentures    full upper and lower    PAST SURGICAL HISTOIRY:   Past Surgical History:  Procedure Laterality Date  . COLONOSCOPY WITH PROPOFOL N/A 08/11/2016   Procedure: COLONOSCOPY WITH PROPOFOL;  Surgeon: [name], MD;  Location: [facility];  Service: Endoscopy;  Laterality: N/A;  . FLEXIBLE BRONCHOSCOPY N/A 02/12/2017   Procedure: FLEXIBLE BRONCHOSCOPY;  Surgeon: Wilhelmina Mcardle, MD;  Location: [facility];  Service: Pulmonary;  Laterality: N/A;  . OOPHORECTOMY Left 1970  . POLYPECTOMY  08/11/2016   Procedure: POLYPECTOMY;  Surgeon: [name], MD;  Location: [place];  Service: Endoscopy;;    SOCIAL HISTORY:   Social History  Substance Use Topics  . Smoking status: Former Smoker    Quit date: 07/28/2004  . Smokeless tobacco: Never Used  . Alcohol use No    FAMILY  HISTORY:   Family History  Problem Relation Age of Onset  . Ovarian cancer Mother   . Lung cancer Father   . Diabetes Neg Hx   . Heart disease Neg Hx   . Breast cancer Neg Hx   . Colon cancer Neg Hx     DRUG ALLERGIES:  No Known Allergies  REVIEW OF SYSTEMS:  CONSTITUTIONAL: No fever, fatigue or weakness.  EYES: No blurred or double vision.  EARS, NOSE, AND THROAT: No tinnitus or ear pain.  RESPIRATORY: No cough, shortness of breath, wheezing or hemoptysis.  CARDIOVASCULAR: No chest pain, orthopnea, edema.  GASTROINTESTINAL: No nausea, vomiting, diarrhea or abdominal pain.  GENITOURINARY: No dysuria, hematuria.  ENDOCRINE: No polyuria, nocturia,  HEMATOLOGY: No anemia, easy bruising or bleeding SKIN: No rash or lesion. MUSCULOSKELETAL: No joint pain or arthritis.   NEUROLOGIC: No tingling, numbness, weakness.  PSYCHIATRY: No anxiety or depression.   MEDICATIONS AT HOME:   Prior to Admission medications   Medication Sig Start Date End Date Taking? Authorizing Provider  acetaminophen (TYLENOL) 500 MG tablet Take 1,000 mg by mouth every 6 (six) hours as needed (for pain.).   Yes [provider]  albuterol (PROVENTIL HFA;VENTOLIN HFA) 108 (90 Base) MCG/ACT inhaler Inhale 2 puffs into the lungs every 4 (four) hours as needed for wheezing or shortness of breath. 03/27/16  Yes Glean Hess, MD  budesonide (ENTOCORT EC) 3  MG 24 hr capsule Take 3 capsules (9 mg total) by mouth every morning. 02/05/17  Yes [name], MD  budesonide-formoterol [facility] 160-4.5 MCG/ACT inhaler Inhale 2 puffs into the lungs 2 (two) times daily. 03/27/16  Yes Glean Hess, MD  docusate sodium (COLACE) 100 MG capsule Take 1 capsule (100 mg total) by mouth every other day. Patient taking differently: Take 100 mg by mouth daily.  07/07/16 07/07/17 Yes Paduchowski, Lennette Bihari, MD  ESTRACE VAGINAL 0.1 MG/GM vaginal cream INSERT 0.5GRAMS VAGINALLY ONCE WEEKLY Patient taking differently: INSERT  0.5GRAMS VAGINALLY twice weekly on monday and friday 08/07/15  Yes Defrancesco, Alanda Slim, MD  losartan (COZAAR) 100 MG tablet Take 1 tablet (100 mg total) by mouth daily. 07/03/16  Yes Glean Hess, MD  MULTIPLE VITAMINS-MINERALS PO Take 1 tablet by mouth daily.   Yes [provider]  ALPRAZolam (XANAX) 0.25 MG tablet Take 1 tablet 20-30 minutes prior to MRI. Patient not taking: Reported on 02/26/2017 02/20/17   Earlie Server, MD  guaiFENesin-dextromethorphan [facility] DM) 100-10 MG/5ML syrup Take 5 mLs by mouth every 4 (four) hours as needed for cough. Patient not taking: Reported on 02/26/2017 01/25/17   Dustin Flock, MD      VITAL SIGNS:  Blood pressure (!) 143/76, pulse 85, temperature 98.3 F (36.8 C), resp. rate (!) 24, height _0  (1.626 m), weight 62.6 kg (138 lb), SpO2 94 %.  PHYSICAL EXAMINATION:  GENERAL:  82 y.o.-year-old patient lying in the bed with no acute distress.  EYES: Pupils equal, round, reactive to light and accommodation. No scleral icterus. Extraocular muscles intact.  HEENT: Head atraumatic, normocephalic. Oropharynx and nasopharynx clear.  NECK:  Supple, no jugular venous distention. No thyroid enlargement, no tenderness.  LUNGS: Normal breath sounds bilaterally, no wheezing, rales,rhonchi or crepitation. No use of accessory muscles of respiration.  CARDIOVASCULAR: S1, S2 normal. No murmurs, rubs, or gallops.  ABDOMEN: Soft, nontender, nondistended. Bowel sounds present. No organomegaly or mass.  EXTREMITIES: No pedal edema, cyanosis, or clubbing.  NEUROLOGIC: Cranial nerves II through XII are intact. Muscle strength 5/5 in all extremities. Sensation intact. Gait not checked.  PSYCHIATRIC: The patient is alert and oriented x 3.  SKIN: No obvious rash, lesion, or ulcer.   LABORATORY PANEL:   CBC  Recent Labs Lab 02/26/17 1526  WBC 10.1  HGB 12.3  HCT 37.4  PLT 272    ------------------------------------------------------------------------------------------------------------------  Chemistries   Recent Labs Lab 02/26/17 1526  NA 139  K 4.4  CL 103  CO2 28  GLUCOSE 108*  BUN 17  CREATININE 0.98  CALCIUM 9.2  AST 20  ALT 16  ALKPHOS 64  BILITOT 0.4   ------------------------------------------------------------------------------------------------------------------  Cardiac Enzymes  Recent Labs Lab 02/26/17 1526  TROPONINI <0.03   ------------------------------------------------------------------------------------------------------------------  RADIOLOGY:  Mr Jeri Cos Wo Contrast  Result Date: 02/26/2017 CLINICAL DATA:  New diagnosis lung cancer.  Staging. EXAM: MRI HEAD WITHOUT AND WITH CONTRAST TECHNIQUE: Multiplanar, multiecho pulse sequences of the brain and surrounding structures were obtained without and with intravenous contrast. CONTRAST:  31m MULTIHANCE GADOBENATE DIMEGLUMINE 529 MG/ML IV SOLN COMPARISON:  None. FINDINGS: Brain: Diffusion imaging shows a 1.3 cm region of acute or subacute infarction in the deep white matter of the right parietal lobe. No other foci of restricted diffusion. There are chronic small-vessel ischemic changes affecting the brainstem. No focal cerebellar insult. Cerebral hemispheres show moderate to marked chronic small-vessel ischemic changes within the white matter bilaterally. After contrast administration, there is minimal linear enhancement in  the right parietal white matter subacute infarction, but not an enhancement pattern suggestive of metastatic disease. There is an enhancing lesion in the medial left temporal lobe measuring 5 mm in diameter consistent with a metastasis. No second metastasis is identified. No hydrocephalus. No extra-axial collection. Prominent vein along the right tentorium simulates an enhancing nodule but I think is a tubular structure. Vascular: Major vessels at the base of the  brain show flow. Skull and upper cervical spine: Negative Sinuses/Orbits: Clear/normal Other: None significant IMPRESSION: 5 mm metastasis in the medial left temporal lobe. No second metastasis identified. 1.3 cm acute/subacute deep white matter infarction in the right parietal region. Moderate chronic small-vessel ischemic changes elsewhere throughout the brain. Electronically Signed   By: Nelson Chimes M.D.   On: 02/26/2017 11:45    EKG:   Orders placed or performed during the hospital encounter of 02/26/17  . EKG 12-Lead  . EKG 12-Lead    IMPRESSION AND PLAN:   Paige Hart  is a 82 y.o. female with a known history of COPD, hypertension, recent diagnosis of stage IV lung cancer on 2 L of home oxygen via nasal cannula has had MRI of the brain done today as part of the lung cancer workup. MRI has revealed acute versus subacute right parietal region infarct and brain met. On call neurologist has recommended to admit the patient for complete stroke workup  # Acute stroke Admit patient to MedSurg unit MRI of the brain has revealed 1.3 cm acute/subacute white matter infarction in the right parietal region Patient has passed bedside swallow evaluation, will start her on cardiac diet Will get carotid Dopplers and 2-D echocardiogram Check fasting lipid panel in a.m. Aspirin and statin Consult neurology  #Stage IV lung cancer Recently diagnosed 1 week ago Follow-up with the oncologist Dr.YU-OP  #Essential hypertension Continue Cozaar  #Chronic history of COPD and exacerbation Nebulizer  treatments as needed     All the records are reviewed and case discussed with ED provider. Management plans discussed with the patient, family and they are in agreement.  CODE STATUS: fc, son and daughter in law [place] THIS PATIENT: 42 minutes.   Note: This dictation was prepared with Dragon dictation along with smaller phrase technology. Any transcriptional errors that  result from this process are unintentional.  Nicholes Mango M.D on 02/26/2017 at 4:32 PM  Between 7am to 6pm - Pager - [phone]  After 6pm go to [email or web address] - password [email or web address] [facility]  [facility]  Office  [phone]  CC: Primary care physician; Glean Hess, MD

## 2017-02-27 ENCOUNTER — Inpatient Hospital Stay
Admit: 2017-02-27 | Discharge: 2017-02-27 | Disposition: A | Discharge status: Home / Self Care | Payer: Medicare Other | Attending: Internal Medicine | Admitting: Internal Medicine

## 2017-02-27 ENCOUNTER — Inpatient Hospital Stay: Payer: Medicare Other

## 2017-02-27 DIAGNOSIS — I639 Cerebral infarction, unspecified: Secondary | ICD-10-CM | POA: Diagnosis not present

## 2017-02-27 DIAGNOSIS — J449 Chronic obstructive pulmonary disease, unspecified: Secondary | ICD-10-CM | POA: Diagnosis not present

## 2017-02-27 DIAGNOSIS — K5150 Left sided colitis without complications: Secondary | ICD-10-CM | POA: Diagnosis not present

## 2017-02-27 DIAGNOSIS — I10 Essential (primary) hypertension: Secondary | ICD-10-CM | POA: Diagnosis not present

## 2017-02-27 DIAGNOSIS — C7931 Secondary malignant neoplasm of brain: Secondary | ICD-10-CM | POA: Diagnosis not present

## 2017-02-27 DIAGNOSIS — I638 Other cerebral infarction: Secondary | ICD-10-CM | POA: Diagnosis not present

## 2017-02-27 DIAGNOSIS — J439 Emphysema, unspecified: Secondary | ICD-10-CM | POA: Diagnosis not present

## 2017-02-27 DIAGNOSIS — C3490 Malignant neoplasm of unspecified part of unspecified bronchus or lung: Secondary | ICD-10-CM | POA: Diagnosis not present

## 2017-02-27 DIAGNOSIS — D6869 Other thrombophilia: Secondary | ICD-10-CM | POA: Diagnosis not present

## 2017-02-27 DIAGNOSIS — I6523 Occlusion and stenosis of bilateral carotid arteries: Secondary | ICD-10-CM | POA: Diagnosis not present

## 2017-02-27 LAB — ECHOCARDIOGRAM COMPLETE
Height: 64 in
Weight: 2208 oz

## 2017-02-27 LAB — LIPID PANEL
CHOL/HDL RATIO: 2.4 ratio
CHOLESTEROL: 162 mg/dL (ref 0–200)
HDL: 68 mg/dL (ref >40)
LDL Cholesterol: 72 mg/dL (ref 0–99)
TRIGLYCERIDES: 112 mg/dL (ref <150)
VLDL: 22 mg/dL (ref 0–40)

## 2017-02-27 MED ORDER — ASPIRIN EC 81 MG PO TBEC: 81.0000 mg | DELAYED_RELEASE_TABLET | Freq: Every day | ORAL | 0 refills | Status: AC

## 2017-02-27 MED ORDER — SIMVASTATIN 40 MG PO TABS: 40.0000 mg | ORAL_TABLET | Freq: Every day | ORAL | 0 refills | Status: DC

## 2017-02-27 NOTE — Progress Notes (Signed)
Pt rested well between neuro checks this shift.   Yellow armband in place for moderate fall risk, although pt is independent.  All needs met and pt is comfortable.

## 2017-02-27 NOTE — Discharge Summary (Signed)
[facility] NAME: Paige Hart    MR#:  [id number]  DATE OF BIRTH:  [birth year]  DATE OF ADMISSION:  02/26/2017 ADMITTING PHYSICIAN: Nicholes Mango, MD  DATE OF DISCHARGE: 02/27/2017  PRIMARY CARE PHYSICIAN: Glean Hess, MD    ADMISSION DIAGNOSIS:  Brain mass [G93.9] Infarction of parietal lobe ([place]) [I63.8]  DISCHARGE DIAGNOSIS:  Active Problems:   Acute CVA (cerebrovascular accident) ([place])   Stroke (cerebrum) ([place])   SECONDARY DIAGNOSIS:   Past Medical History:  Diagnosis Date  . Asthma   . Colitis    left sided  . Cystocele   . Emphysema/COPD ([place])   . Hypertension   . Lung cancer ([place])   . Procidentia of uterus   . Urge incontinence   . Vaginal atrophy   . Wears dentures    full upper and lower    HOSPITAL COURSE:   # Acute stroke   MRI conformed stroke, Carotid doppler showed <50% blockages.   Echo is also done.   Pt is seen by PT and Neurologist.   No need after d./c   Started on simvastatin and ASA, follow with PMD>  #Stage IV lung cancer Recently diagnosed 1 week ago Follow-up with the oncologist Dr.YU-OP  #Essential hypertension Continue Cozaar  #Chronic history of COPD and exacerbation Nebulizer  treatments as needed  DISCHARGE CONDITIONS:   Stable.  CONSULTS OBTAINED:  Treatment Team:  Alexis Goodell, MD  DRUG ALLERGIES:  No Known Allergies  DISCHARGE MEDICATIONS:   Current Discharge Medication List    START taking these medications   Details  aspirin EC 81 MG tablet Take 1 tablet (81 mg total) by mouth daily. Qty: 100 tablet, Refills: 0    simvastatin (ZOCOR) 40 MG tablet Take 1 tablet (40 mg total) by mouth daily at 6 PM. Qty: 30 tablet, Refills: 0      CONTINUE these medications which have NOT CHANGED   Details  acetaminophen (TYLENOL) 500 MG tablet Take 1,000 mg by mouth every 6 (six) hours as needed (for pain.).    albuterol (PROVENTIL HFA;VENTOLIN  HFA) 108 (90 Base) MCG/ACT inhaler Inhale 2 puffs into the lungs every 4 (four) hours as needed for wheezing or shortness of breath. Qty: 1 Inhaler, Refills: 12   Associated Diagnoses: Other emphysema (HCC)    budesonide (ENTOCORT EC) 3 MG 24 hr capsule Take 3 capsules (9 mg total) by mouth every morning. Qty: 90 capsule, Refills: 1    budesonide-formoterol (SYMBICORT) 160-4.5 MCG/ACT inhaler Inhale 2 puffs into the lungs 2 (two) times daily. Qty: 10.2 g, Refills: 12   Associated Diagnoses: Other emphysema (HCC)    docusate sodium (COLACE) 100 MG capsule Take 1 capsule (100 mg total) by mouth every other day. Qty: 30 capsule, Refills: 2    ESTRACE VAGINAL 0.1 MG/GM vaginal cream INSERT 0.5GRAMS VAGINALLY ONCE WEEKLY Qty: 45 g, Refills: 1    losartan (COZAAR) 100 MG tablet Take 1 tablet (100 mg total) by mouth daily. Qty: 90 tablet, Refills: 3   Associated Diagnoses: Essential (primary) hypertension    MULTIPLE VITAMINS-MINERALS PO Take 1 tablet by mouth daily.    ALPRAZolam (XANAX) 0.25 MG tablet Take 1 tablet 20-30 minutes prior to MRI. Qty: 1 tablet, Refills: 0    guaiFENesin-dextromethorphan (ROBITUSSIN DM) 100-10 MG/5ML syrup Take 5 mLs by mouth every 4 (four) hours as needed for cough. Qty: 118 mL, Refills: 0         DISCHARGE INSTRUCTIONS:  Follow with PMD in 1 week.  If you experience worsening of your admission symptoms, develop shortness of breath, life threatening emergency, suicidal or homicidal thoughts you must seek medical attention immediately by calling 911 or calling your MD immediately  if symptoms less severe.  You Must read complete instructions/literature along with all the possible adverse reactions/side effects for all the Medicines you take and that have been prescribed to you. Take any new Medicines after you have completely understood and accept all the possible adverse reactions/side effects.   Please note  You were cared for by a hospitalist  during your hospital stay. If you have any questions about your discharge medications or the care you received while you were in the hospital after you are discharged, you can call the unit and asked to speak with the hospitalist on call if the hospitalist that took care of you is not available. Once you are discharged, your primary care physician will handle any further medical issues. Please note that NO REFILLS for any discharge medications will be authorized once you are discharged, as it is imperative that you return to your primary care physician (or establish a relationship with a primary care physician if you do not have one) for your aftercare needs so that they can reassess your need for medications and monitor your lab values.    Today   CHIEF COMPLAINT:   Chief Complaint  Patient presents with  . Abnormal Lab    HISTORY OF PRESENT ILLNESS:  Paige Hart  is a 82 y.o. female with a known history of COPD, hypertension, recent diagnosis of stage IV lung cancer on 2 L of home oxygen via nasal cannula has had MRI of the brain done today as part of the lung cancer workup. MRI has revealed acute versus subacute right parietal region infarct and brain met. On call neurologist has recommended to admit the patient for complete stroke workup. Patient denies any headache or blurry vision, denies any extremity weakness. Patient denies any unsteady gait   VITAL SIGNS:  Blood pressure 126/87, pulse 80, temperature 98.5 F (36.9 C), temperature source Oral, resp. rate 20, height 5' 4"  (1.626 m), weight 62.6 kg (138 lb), SpO2 94 %.  I/O:   Intake/Output Summary (Last 24 hours) at 02/27/17 1506 Last data filed at 02/27/17 0800  Gross per 24 hour  Intake              240 ml  Output              200 ml  Net               40 ml    PHYSICAL EXAMINATION:  GENERAL:  82 y.o.-year-old patient lying in the bed with no acute distress.  EYES: Pupils equal, round, reactive to light and  accommodation. No scleral icterus. Extraocular muscles intact.  HEENT: Head atraumatic, normocephalic. Oropharynx and nasopharynx clear.  NECK:  Supple, no jugular venous distention. No thyroid enlargement, no tenderness.  LUNGS: Normal breath sounds bilaterally, no wheezing, rales,rhonchi or crepitation. No use of accessory muscles of respiration.  CARDIOVASCULAR: S1, S2 normal. No murmurs, rubs, or gallops.  ABDOMEN: Soft, non-tender, non-distended. Bowel sounds present. No organomegaly or mass.  EXTREMITIES: No pedal edema, cyanosis, or clubbing.  NEUROLOGIC: Cranial nerves II through XII are intact. Muscle strength 5/5 in all extremities. Sensation intact. Gait not checked.  PSYCHIATRIC: The patient is alert and oriented x 3.  SKIN: No obvious rash, lesion, or ulcer.  DATA REVIEW:   CBC  Recent Labs Lab 02/26/17 1526  WBC 10.1  HGB 12.3  HCT 37.4  PLT 272    Chemistries   Recent Labs Lab 02/26/17 1526  NA 139  K 4.4  CL 103  CO2 28  GLUCOSE 108*  BUN 17  CREATININE 0.98  CALCIUM 9.2  AST 20  ALT 16  ALKPHOS 64  BILITOT 0.4    Cardiac Enzymes  Recent Labs Lab 02/26/17 1526  TROPONINI <0.03    Microbiology Results  Results for orders placed or performed during the hospital encounter of 02/07/17  Blood culture (routine x 2)     Status: None   Collection Time: 02/07/17  1:30 PM  Result Value Ref Range Status   Specimen Description BLOOD RIGHT ANTECUBITAL  Final   Special Requests   Final    BOTTLES DRAWN AEROBIC AND ANAEROBIC Blood Culture adequate volume   Culture NO GROWTH 5 DAYS  Final   Report Status 02/12/2017 FINAL  Final  Blood culture (routine x 2)     Status: None   Collection Time: 02/07/17  1:45 PM  Result Value Ref Range Status   Specimen Description BLOOD BLOOD RIGHT FOREARM  Final   Special Requests   Final    BOTTLES DRAWN AEROBIC AND ANAEROBIC Blood Culture adequate volume   Culture NO GROWTH 5 DAYS  Final   Report Status 02/12/2017  FINAL  Final  MRSA PCR Screening     Status: None   Collection Time: 02/07/17  4:40 PM  Result Value Ref Range Status   MRSA by PCR NEGATIVE NEGATIVE Final    Comment:        The GeneXpert MRSA Assay (FDA approved for NASAL specimens only), is one component of a comprehensive MRSA colonization surveillance program. It is not intended to diagnose MRSA infection nor to guide or monitor treatment for MRSA infections.     RADIOLOGY:  Mr Jeri Cos PI Contrast  Result Date: 02/26/2017 CLINICAL DATA:  New diagnosis lung cancer.  Staging. EXAM: MRI HEAD WITHOUT AND WITH CONTRAST TECHNIQUE: Multiplanar, multiecho pulse sequences of the brain and surrounding structures were obtained without and with intravenous contrast. CONTRAST:  64m MULTIHANCE GADOBENATE DIMEGLUMINE 529 MG/ML IV SOLN COMPARISON:  None. FINDINGS: Brain: Diffusion imaging shows a 1.3 cm region of acute or subacute infarction in the deep white matter of the right parietal lobe. No other foci of restricted diffusion. There are chronic small-vessel ischemic changes affecting the brainstem. No focal cerebellar insult. Cerebral hemispheres show moderate to marked chronic small-vessel ischemic changes within the white matter bilaterally. After contrast administration, there is minimal linear enhancement in the right parietal white matter subacute infarction, but not an enhancement pattern suggestive of metastatic disease. There is an enhancing lesion in the medial left temporal lobe measuring 5 mm in diameter consistent with a metastasis. No second metastasis is identified. No hydrocephalus. No extra-axial collection. Prominent vein along the right tentorium simulates an enhancing nodule but I think is a tubular structure. Vascular: Major vessels at the base of the brain show flow. Skull and upper cervical spine: Negative Sinuses/Orbits: Clear/normal Other: None significant IMPRESSION: 5 mm metastasis in the medial left temporal lobe. No second  metastasis identified. 1.3 cm acute/subacute deep white matter infarction in the right parietal region. Moderate chronic small-vessel ischemic changes elsewhere throughout the brain. Electronically Signed   By: MNelson ChimesM.D.   On: 02/26/2017 11:45   [place]at [facility])  Result Date: 02/27/2017 CLINICAL DATA:  CVA EXAM: BILATERAL CAROTID DUPLEX ULTRASOUND TECHNIQUE: Pearline Cables scale imaging, color Doppler and duplex ultrasound were performed of bilateral carotid and vertebral arteries in the neck. COMPARISON:  None. FINDINGS: Criteria: Quantification of carotid stenosis is based on velocity parameters that correlate the residual internal carotid diameter with NASCET-based stenosis levels, using the diameter of the distal internal carotid lumen as the denominator for stenosis measurement. The following velocity measurements were obtained: RIGHT ICA:  122 cm/sec CCA:  67 cm/sec SYSTOLIC ICA/CCA RATIO:  1.8 DIASTOLIC ICA/CCA RATIO:  3.2 ECA:  82 cm/sec LEFT ICA:  80 cm/sec CCA:  56 cm/sec SYSTOLIC ICA/CCA RATIO:  1.4 DIASTOLIC ICA/CCA RATIO:  2.3 ECA:  82 cm/sec RIGHT CAROTID ARTERY: Little if any plaque in the bulb. Minimal calcified plaque in the lower internal carotid. Low resistance internal carotid Doppler pattern. RIGHT VERTEBRAL ARTERY:  Antegrade. LEFT CAROTID ARTERY: Mild irregular plaque in the bulb. Low resistance internal carotid Doppler pattern preserved. LEFT VERTEBRAL ARTERY:  Antegrade. IMPRESSION: Less than 50% stenosis in the right and left internal carotid arteries. Electronically Signed   By: Marybelle Killings M.D.   On: 02/27/2017 11:27    EKG:   Orders placed or performed during the hospital encounter of 02/26/17  . EKG 12-Lead  . EKG 12-Lead      Management plans discussed with the patient, family and they are in agreement.  CODE STATUS:     Code Status Orders        Start     Ordered   02/26/17 2141  Full code  Continuous     02/26/17 2140    Code  Status History    Date Active Date Inactive Code Status Order ID Comments User Context   02/07/2017  3:47 PM 02/08/2017  7:37 PM Full Code [id number]  Fritzi Mandes, MD Inpatient   01/22/2017  5:34 PM 01/25/2017  1:55 PM Full Code [id number]  Loletha Grayer, MD ED    Advance Directive Documentation     Most Recent Value  Type of Advance Directive  Healthcare Power of Attorney  Pre-existing out of facility DNR order (yellow form or pink MOST form)  -  "MOST" Form in Place?  -      TOTAL TIME TAKING CARE OF THIS PATIENT: 35 minutes.    Vaughan Basta M.D on 02/27/2017 at 3:06 PM  Between 7am to 6pm - Pager - [phone]  After 6pm go to [email or web address] - password [email or web address] [facility]  Office  [phone]  CC: Primary care physician; Glean Hess, MD   Note: This dictation was prepared with Dragon dictation along with smaller phrase technology. Any transcriptional errors that result from this process are unintentional.

## 2017-02-27 NOTE — Progress Notes (Signed)
SLP Cancellation Note  Patient Details Name: Ivionna Verley MRN: [id number] DOB: [birth year]   Cancelled treatment:       Reason Eval/Treat Not Completed: SLP screened, no needs identified, will sign off  Speech therapy note: The patient does not require speech therapy at this time.  She reports no changes in speech intelligibility, verbal expression, auditory comprehension, or reading.  She reports no cognitive changes.   Lou Miner 02/27/2017, 9:47 AM

## 2017-02-27 NOTE — Progress Notes (Signed)
*  PRELIMINARY RESULTS* Echocardiogram 2D Echocardiogram has been performed.  Paige Hart 02/27/2017, 2:23 PM

## 2017-02-27 NOTE — Consult Note (Signed)
Referring Physician: Anselm Jungling    Chief Complaint: Abnormal MRI  HPI: Paige Hart is an 82 y.o. female recently diagnosed with lung cancer who was having a MRI of the brain yesterday in work up and found to have an acute infarct as well as a metastatic lesion.  Patient admitted for work up.  Initial NIHSS of 0.    Date last known well: Unable to determine Time last known well: Unable to determine tPA Given: No: Unable to determine LKW  Past Medical History:  Diagnosis Date  . Asthma   . Colitis    left sided  . Cystocele   . Emphysema/COPD ([place])   . Hypertension   . Lung cancer ([place])   . Procidentia of uterus   . Urge incontinence   . Vaginal atrophy   . Wears dentures    full upper and lower    Past Surgical History:  Procedure Laterality Date  . COLONOSCOPY WITH PROPOFOL N/A 08/11/2016   Procedure: COLONOSCOPY WITH PROPOFOL;  Surgeon: [name], MD;  Location: [place];  Service: Endoscopy;  Laterality: N/A;  . FLEXIBLE BRONCHOSCOPY N/A 02/12/2017   Procedure: FLEXIBLE BRONCHOSCOPY;  Surgeon: Wilhelmina Mcardle, MD;  Location: [facility];  Service: Pulmonary;  Laterality: N/A;  . OOPHORECTOMY Left 1970  . POLYPECTOMY  08/11/2016   Procedure: POLYPECTOMY;  Surgeon: [name], MD;  Location: [facility];  Service: Endoscopy;;    Family History  Problem Relation Age of Onset  . Ovarian cancer Mother   . Lung cancer Father   . Diabetes Neg Hx   . Heart disease Neg Hx   . Breast cancer Neg Hx   . Colon cancer Neg Hx    Social History:  reports that she quit smoking about 12 years ago. She has never used smokeless tobacco. She reports that she does not drink alcohol or use drugs.  Allergies: No Known Allergies  Medications:  I have reviewed the patient's current medications. Prior to Admission:  Prescriptions Prior to Admission  Medication Sig Dispense Refill Last Dose  . acetaminophen (TYLENOL) 500 MG tablet Take 1,000 mg by mouth every 6 (six)  hours as needed (for pain.).   prn at prn  . albuterol (PROVENTIL HFA;VENTOLIN HFA) 108 (90 Base) MCG/ACT inhaler Inhale 2 puffs into the lungs every 4 (four) hours as needed for wheezing or shortness of breath. 1 Inhaler 12 prn at prn  . budesonide (ENTOCORT EC) 3 MG 24 hr capsule Take 3 capsules (9 mg total) by mouth every morning. 90 capsule 1 unknown at unknown  . budesonide-formoterol (SYMBICORT) 160-4.5 MCG/ACT inhaler Inhale 2 puffs into the lungs 2 (two) times daily. 10.2 g 12 unknown at unknown  . docusate sodium (COLACE) 100 MG capsule Take 1 capsule (100 mg total) by mouth every other day. (Patient taking differently: Take 100 mg by mouth daily. ) 30 capsule 2 unknown at unknown  . ESTRACE VAGINAL 0.1 MG/GM vaginal cream INSERT 0.5GRAMS VAGINALLY ONCE WEEKLY (Patient taking differently: INSERT 0.5GRAMS VAGINALLY twice weekly on monday and friday) 45 g 1 unknown at unknown  . losartan (COZAAR) 100 MG tablet Take 1 tablet (100 mg total) by mouth daily. 90 tablet 3 unknown at unknown  . MULTIPLE VITAMINS-MINERALS PO Take 1 tablet by mouth daily.   unknown at unknown  . ALPRAZolam (XANAX) 0.25 MG tablet Take 1 tablet 20-30 minutes prior to MRI. (Patient not taking: Reported on 02/26/2017) 1 tablet 0 Completed Course at Unknown time  . guaiFENesin-dextromethorphan (ROBITUSSIN  DM) 100-10 MG/5ML syrup Take 5 mLs by mouth every 4 (four) hours as needed for cough. (Patient not taking: Reported on 02/26/2017) 118 mL 0 Completed Course at Unknown time   Scheduled: . aspirin EC  325 mg Oral Daily  . budesonide  9 mg Oral [id number]  . enoxaparin (LOVENOX) injection  40 mg Subcutaneous Q24H  . losartan  100 mg Oral Daily  . mometasone-formoterol  2 puff Inhalation BID  . simvastatin  40 mg Oral q1800    ROS: History obtained from the patient  General ROS: negative for - chills, fatigue, fever, night sweats, weight gain or weight loss Psychological ROS: negative for - behavioral disorder,  hallucinations, memory difficulties, mood swings or suicidal ideation Ophthalmic ROS: negative for - blurry vision, double vision, eye pain or loss of vision ENT ROS: negative for - epistaxis, nasal discharge, oral lesions, sore throat, tinnitus or vertigo Allergy and Immunology ROS: negative for - hives or itchy/watery eyes Hematological and Lymphatic ROS: negative for - bleeding problems, bruising or swollen lymph nodes Endocrine ROS: negative for - galactorrhea, hair pattern changes, polydipsia/polyuria or temperature intolerance Respiratory ROS: cough Cardiovascular ROS: negative for - chest pain, dyspnea on exertion, edema or irregular heartbeat Gastrointestinal ROS: negative for - abdominal pain, diarrhea, hematemesis, nausea/vomiting or stool incontinence Genito-Urinary ROS: negative for - dysuria, hematuria, incontinence or urinary frequency/urgency Musculoskeletal ROS: negative for - joint swelling or muscular weakness Neurological ROS: as noted in HPI Dermatological ROS: negative for rash and skin lesion changes  Physical Examination: Blood pressure 126/87, pulse 80, temperature 98.5 F (36.9 C), temperature source Oral, resp. rate 20, height _0  (1.626 m), weight 62.6 kg (138 lb), SpO2 94 %.  HEENT-  Normocephalic, no lesions, without obvious abnormality.  Normal external eye and conjunctiva.  Normal TM's bilaterally.  Normal auditory canals and external ears. Normal external nose, mucus membranes and septum.  Normal pharynx. Cardiovascular- S1, S2 normal, pulses palpable throughout   Lungs- chest clear, no wheezing, rales, normal symmetric air entry Abdomen- soft, non-tender; bowel sounds normal; no masses,  no organomegaly Extremities- no edema Lymph-no adenopathy palpable Musculoskeletal-no joint tenderness, deformity or swelling Skin-warm and dry, no hyperpigmentation, vitiligo, or suspicious lesions  Neurological Examination   Mental Status: Alert, oriented, thought  content appropriate.  Speech fluent without evidence of aphasia.  Able to follow 3 step commands without difficulty. Cranial Nerves: II: Discs flat bilaterally; Visual fields grossly normal, pupils equal, round, reactive to light and accommodation III,IV, VI: ptosis not present, extra-ocular motions intact bilaterally V,VII: smile symmetric, facial light touch sensation normal bilaterally VIII: hearing normal bilaterally IX,X: gag reflex present XI: bilateral shoulder shrug XII: tongue deviation to the right Motor: Right : Upper extremity   5/5    Left:     Upper extremity   5/5  Lower extremity   5/5     Lower extremity   5/5 Tone and bulk:normal tone throughout; no atrophy noted Sensory: Pinprick and light touch intact throughout, bilaterally Deep Tendon Reflexes: 2+ and symmetric with absent AJ's bilaterally Plantars: Right: downgoing   Left: downgoing Cerebellar: Normal finger-to-nose and normal heel-to-shin testing bilaterally Gait: normal gait and station    Laboratory Studies:  Basic Metabolic Panel:  Recent Labs Lab 02/26/17 1526  NA 139  K 4.4  CL 103  CO2 28  GLUCOSE 108*  BUN 17  CREATININE 0.98  CALCIUM 9.2    Liver Function Tests:  Recent Labs Lab 02/26/17 1526  AST 20  ALT 16  ALKPHOS 64  BILITOT 0.4  PROT 7.4  ALBUMIN 3.9   No results for input(s): LIPASE, AMYLASE in the last 168 hours. No results for input(s): AMMONIA in the last 168 hours.  CBC:  Recent Labs Lab 02/26/17 1526  WBC 10.1  NEUTROABS 7.5*  HGB 12.3  HCT 37.4  MCV 88.1  PLT 272    Cardiac Enzymes:  Recent Labs Lab 02/26/17 1526  TROPONINI <0.03    BNP: Invalid input(s): POCBNP  CBG: No results for input(s): GLUCAP in the last 168 hours.  Microbiology: Results for orders placed or performed during the hospital encounter of 02/07/17  Blood culture (routine x 2)     Status: None   Collection Time: 02/07/17  1:30 PM  Result Value Ref Range Status   Specimen  Description BLOOD RIGHT ANTECUBITAL  Final   Special Requests   Final    BOTTLES DRAWN AEROBIC AND ANAEROBIC Blood Culture adequate volume   Culture NO GROWTH 5 DAYS  Final   Report Status 02/12/2017 FINAL  Final  Blood culture (routine x 2)     Status: None   Collection Time: 02/07/17  1:45 PM  Result Value Ref Range Status   Specimen Description BLOOD BLOOD RIGHT FOREARM  Final   Special Requests   Final    BOTTLES DRAWN AEROBIC AND ANAEROBIC Blood Culture adequate volume   Culture NO GROWTH 5 DAYS  Final   Report Status 02/12/2017 FINAL  Final  MRSA PCR Screening     Status: None   Collection Time: 02/07/17  4:40 PM  Result Value Ref Range Status   MRSA by PCR NEGATIVE NEGATIVE Final    Comment:        The GeneXpert MRSA Assay (FDA approved for NASAL specimens only), is one component of a comprehensive MRSA colonization surveillance program. It is not intended to diagnose MRSA infection nor to guide or monitor treatment for MRSA infections.     Coagulation Studies:  Recent Labs  02/26/17 1526  LABPROT 12.4  INR 0.92    Urinalysis: No results for input(s): COLORURINE, LABSPEC, PHURINE, GLUCOSEU, HGBUR, BILIRUBINUR, KETONESUR, PROTEINUR, UROBILINOGEN, NITRITE, LEUKOCYTESUR in the last 168 hours.  Invalid input(s): APPERANCEUR  Lipid Panel:    Component Value Date/Time   CHOL 162 02/27/2017 0343   CHOL 173 03/27/2016 1109   TRIG 112 02/27/2017 0343   HDL 68 02/27/2017 0343   HDL 62 03/27/2016 1109   CHOLHDL 2.4 02/27/2017 0343   VLDL 22 02/27/2017 0343   LDLCALC 72 02/27/2017 0343   LDLCALC 72 03/27/2016 1109    HgbA1C: No results found for: HGBA1C  Urine Drug Screen:  No results found for: LABOPIA, COCAINSCRNUR, LABBENZ, AMPHETMU, THCU, LABBARB  Alcohol Level: No results for input(s): ETH in the last 168 hours.  Other results: EKG: normal sinus rhythm at 82bpm.  Imaging: Mr Jeri Cos VZ Contrast  Result Date: 02/26/2017 CLINICAL DATA:  New diagnosis  lung cancer.  Staging. EXAM: MRI HEAD WITHOUT AND WITH CONTRAST TECHNIQUE: Multiplanar, multiecho pulse sequences of the brain and surrounding structures were obtained without and with intravenous contrast. CONTRAST:  8m MULTIHANCE GADOBENATE DIMEGLUMINE 529 MG/ML IV SOLN COMPARISON:  None. FINDINGS: Brain: Diffusion imaging shows a 1.3 cm region of acute or subacute infarction in the deep white matter of the right parietal lobe. No other foci of restricted diffusion. There are chronic small-vessel ischemic changes affecting the brainstem. No focal cerebellar insult. Cerebral hemispheres show moderate to marked chronic small-vessel ischemic changes within the  white matter bilaterally. After contrast administration, there is minimal linear enhancement in the right parietal white matter subacute infarction, but not an enhancement pattern suggestive of metastatic disease. There is an enhancing lesion in the medial left temporal lobe measuring 5 mm in diameter consistent with a metastasis. No second metastasis is identified. No hydrocephalus. No extra-axial collection. Prominent vein along the right tentorium simulates an enhancing nodule but I think is a tubular structure. Vascular: Major vessels at the base of the brain show flow. Skull and upper cervical spine: Negative Sinuses/Orbits: Clear/normal Other: None significant IMPRESSION: 5 mm metastasis in the medial left temporal lobe. No second metastasis identified. 1.3 cm acute/subacute deep white matter infarction in the right parietal region. Moderate chronic small-vessel ischemic changes elsewhere throughout the brain. Electronically Signed   By: Nelson Chimes M.D.   On: 02/26/2017 11:45   US Carotid Bilateral (at [facility] Only)  Result Date: 02/27/2017 CLINICAL DATA:  CVA EXAM: BILATERAL CAROTID DUPLEX ULTRASOUND TECHNIQUE: Pearline Cables scale imaging, color Doppler and duplex ultrasound were performed of bilateral carotid and vertebral arteries in the neck.  COMPARISON:  None. FINDINGS: Criteria: Quantification of carotid stenosis is based on velocity parameters that correlate the residual internal carotid diameter with NASCET-based stenosis levels, using the diameter of the distal internal carotid lumen as the denominator for stenosis measurement. The following velocity measurements were obtained: RIGHT ICA:  122 cm/sec CCA:  67 cm/sec SYSTOLIC ICA/CCA RATIO:  1.8 DIASTOLIC ICA/CCA RATIO:  3.2 ECA:  82 cm/sec LEFT ICA:  80 cm/sec CCA:  56 cm/sec SYSTOLIC ICA/CCA RATIO:  1.4 DIASTOLIC ICA/CCA RATIO:  2.3 ECA:  82 cm/sec RIGHT CAROTID ARTERY: Little if any plaque in the bulb. Minimal calcified plaque in the lower internal carotid. Low resistance internal carotid Doppler pattern. RIGHT VERTEBRAL ARTERY:  Antegrade. LEFT CAROTID ARTERY: Mild irregular plaque in the bulb. Low resistance internal carotid Doppler pattern preserved. LEFT VERTEBRAL ARTERY:  Antegrade. IMPRESSION: Less than 50% stenosis in the right and left internal carotid arteries. Electronically Signed   By: Marybelle Killings M.D.   On: 02/27/2017 11:27    Assessment: 82 y.o. female with metastatic lung cancer.  MRI of the brain performed in work up. Reviewed and shows an area of metastasis and a right deep white matter parietal acute infarct.  Likely secondary to hypercoagulable state from cancer.  Patient without symptoms.  Carotid dopplers show no evidence of hemodynamically significant stenosis.  Echocardiogram shows no cardiac source of emboli with an EF of 60-65%.  A1c pending, LDL 72.  On no antiplatelet therapy at home.    Stroke Risk Factors - hypertension  Plan: 1. Prophylactic therapy-Antiplatelet med: Aspirin - dose 382m daily 2. Telemetry monitoring 3. Frequent neuro checks 4. Follow up with oncology 5. No therapy services required.      LAlexis Goodell MD Neurology [phone]/09/2016, 3:02 PM

## 2017-02-27 NOTE — Evaluation (Signed)
Physical Therapy Evaluation Patient Details Name: Presli Fanguy MRN: [id number] DOB: 03-May-1935 Today's Date: 02/27/2017   History of Present Illness  Pt. is an 82 y.o. female who was admitted to [facility] with a R parietal CVA.  Pt. PMHx includes: COPD, HTN, Stage IV Lung CA. Imaging revealed 51m Metastasis in Medial Left Temporal Lobe, and 1.3cm acute deep white matter infarct in right parietal region, and moderate small vessel Ischemic changes.    Clinical Impression  Prior to admission pt reports independence with ADLs and functional mobility with baseline 2L O2 via [place]. Pt reports family/neighbors available as needed upon discharge. Pt A and O x4. Pt currently able to perform bed mobility and transfers independently; pt able to ambulate nursing loop and navigate stairs with CGA with minimal use of railing. Pt denies pain throughout session. No loss of balance noted with functional mobility. No physical therapy needs noted for follow up. Will discharge pt in house for PT.    Follow Up Recommendations No PT follow up    Equipment Recommendations       Recommendations for Other Services       Precautions / Restrictions Precautions Precautions: Fall;Other (comment) Precaution Comments: Monitor O2 (baseline 2 L [place])      Mobility  Bed Mobility Overal bed mobility: Independent             General bed mobility comments: pt able to perform supine to sit and sit to supine independently   Transfers Overall transfer level: Independent               General transfer comment: pt able to perform sit to stand independently with stand by assist  Ambulation/Gait Ambulation/Gait assistance: Min guard       Gait velocity: minimally decreased   General Gait Details: pt able to ambulate 120 ft to stairs with CGA; able to navigate stairs required to enter home with minimal use of railing; pt able to ambulate 140 ft back to bedside chiar with CGA  Stairs Stairs: Yes Stairs  assistance: Min guard   Number of Stairs: 2 General stair comments: pt able to navigate 2 stairs as required to enter home through front door with CGA and minimal use of railing  Wheelchair Mobility    Modified Rankin (Stroke Patients Only)       Balance Overall balance assessment: Independent;No apparent balance deficits (not formally assessed)                                           Pertinent Vitals/Pain Pain Assessment: No/denies pain Pain Score: 0-No pain  HR - 80 to 92 bpm throughout session via telemetry  O2 - 94 to 97% on 2L [place] throughout session via pulse ox    Home Living Family/patient expects to be discharged to:: Private residence Living Arrangements: Alone Available Help at Discharge: Family;Neighbor Type of Home: House Home Access: Stairs to enter Entrance Stairs-Rails: Left Entrance Stairs-Number of Steps: 1 step in garage; 2 steps front door Home Layout: Two level;Able to live on main level with bedroom/bathroom Home Equipment: Shower seat - built in      Prior Function Level of Independence: Independent       Comments: pt reports independence with ADLs and functional mobility     Hand Dominance   Dominant Hand: Right    Extremity/Trunk Assessment   Upper Extremity Assessment Upper Extremity Assessment:  Overall [facility] for tasks assessed    Lower Extremity Assessment Lower Extremity Assessment: Overall WFL for tasks assessed (B strength, coordination, sensation, proprioception WFL)       Communication   Communication: No difficulties  Cognition Arousal/Alertness: Awake/alert Behavior During Therapy: WFL for tasks assessed/performed Overall Cognitive Status: Within Functional Limits for tasks assessed                                        General Comments      Exercises     Assessment/Plan    PT Assessment Patent does not need any further PT services  PT Problem List         PT Treatment  Interventions      PT Goals (Current goals can be found in the Care Plan section)  Acute Rehab PT Goals Patient Stated Goal: to return home PT Goal Formulation: With patient Time For Goal Achievement: 03/13/17 Potential to Achieve Goals: Good    Frequency     Barriers to discharge        Co-evaluation               AM-PAC PT "6 Clicks" Daily Activity  Outcome Measure Difficulty turning over in bed (including adjusting bedclothes, sheets and blankets)?: None Difficulty moving from lying on back to sitting on the side of the bed? : None Difficulty sitting down on and standing up from a chair with arms (e.g., wheelchair, bedside commode, etc,.)?: None Help needed moving to and from a bed to chair (including a wheelchair)?: None Help needed walking in hospital room?: None Help needed climbing 3-5 steps with a railing? : None 6 Click Score: 24    End of Session Equipment Utilized During Treatment: Gait belt;Oxygen Activity Tolerance: Patient tolerated treatment well Patient left: in chair;with call bell/phone within reach;with chair alarm set Nurse Communication: Mobility status (via whiteboard) PT Visit Diagnosis: Muscle weakness (generalized) (M62.81)    Time: 2924-4628 PT Time Calculation (min) (ACUTE ONLY): 26 min   Charges:         PT G CodesLaqueta Carina, SPT March 28, 2017,12:28 PM [phone]

## 2017-02-27 NOTE — Care Management Obs Status (Signed)
[place] NOTIFICATION   Patient Details  Name: Paige Hart MRN: [id number] Date of Birth: 1934/12/11   Medicare Observation Status Notification Given:  Yes    Shelbie Ammons, RN 02/27/2017, 3:40 PM

## 2017-02-27 NOTE — Evaluation (Signed)
Occupational Therapy Evaluation Patient Details Name: Paige Hart MRN: [id number] DOB: 29-Jun-1935 Today's Date: 02/27/2017    History of Present Illness Pt. is an 82 y.o. female who was admitted to [facility] with a CVA.  Pt. PMHx includes: COPD, HTN, Stage IV Lung CA. Pt. Imaging revealed 2m Metastasis in Medial Left Temporal Lobe, and 1.3cm acute deep white matter infarct in right parietal region, and moderate small vessel Ischemic changes.   Clinical Impression   Pt. Is an 82[facility]. [facility] Pt. Education was provided about energy conservation, and work simplification techniques. Pt. Was provided with a visual handout. Pt. Reports being at baseline with her bilateral UEs, and ADL functioning. Pt. Reports she has had tingling in her bilateral hands prior to hospitalization. No further OT services are warranted at this time. Pt. Is in agreement.    Follow Up Recommendations  No OT follow up    Equipment Recommendations       Recommendations for Other Services       Precautions / Restrictions                                                      ADL either performed or assessed with clinical judgement   ADL   Eating/Feeding: Set up   Grooming: Set up   Upper Body Bathing: Set up   Lower Body Bathing: Set up   Upper Body Dressing : Independent   Lower Body Dressing: Independent               Functional mobility during ADLs: Supervision/safety General ADL Comments: Pt. was verbally educated about energy conservation, work simplification techniques. A visual handout was provided.     Vision         Perception     Praxis      Pertinent Vitals/Pain Pain Assessment: 0-10 Pain Score: 0-No pain     Hand Dominance Right   Extremity/Trunk Assessment Upper Extremity Assessment Upper Extremity Assessment: Overall WFL for tasks assessed (Pt. as tingling in bilateral hands, which pt. reports is not new. )           Communication Communication Communication: No difficulties   Cognition Arousal/Alertness: Awake/alert Behavior During Therapy: WFL for tasks assessed/performed Overall Cognitive Status: Within Functional Limits for tasks assessed                                     General Comments       Exercises     Shoulder Instructions      Home Living Family/patient expects to be discharged to:: Private residence Living Arrangements: Alone Available Help at Discharge: Family Type of Home: House   Entrance Stairs-Number of Steps: 1 step in the garage.   Home Layout: Two level;Able to live on main level with bedroom/bathroom     Bathroom Shower/Tub: Walk-in shower;Tub/shower unit         Home Equipment: Shower seat - built in          Prior Functioning/Environment Level of Independence: Independent with assistive device(s)    ADL's / Homemaking Assistance Needed: Pt. was independent with ADLs, IADLs, driving, has housekeeping services to assist with heavy housekeeping tasks.  OT Problem List:        OT Treatment/Interventions:      OT Goals(Current goals can be found in the care plan section) Acute Rehab OT Goals Patient Stated Goal: To return home OT Goal Formulation: With patient Potential to Achieve Goals: Good  OT Frequency:     Barriers to D/C:            Co-evaluation              AM-PAC PT "6 Clicks" Daily Activity     Outcome Measure Help from another person eating meals?: None Help from another person taking care of personal grooming?: None Help from another person toileting, which includes using toliet, bedpan, or urinal?: A Little Help from another person bathing (including washing, rinsing, drying)?: A Little Help from another person to put on and taking off regular upper body clothing?: None Help from another person to put on and taking off regular lower body clothing?: None 6 Click Score: 22   End of  Session Equipment Utilized During Treatment: Gait belt  Activity Tolerance: Patient tolerated treatment well Patient left: in chair;with call bell/phone within reach (Pt. preparign to go to ultrasound)  OT Visit Diagnosis: Muscle weakness (generalized) (M62.81)                Time: 0203-5573 OT Time Calculation (min): 15 min Charges:  OT Evaluation $OT Eval Low Complexity: 1 Procedure G-Codes:     Harrel Carina, MS, OTR/L   Harrel Carina, MS, OTR/L 02/27/2017, 11:15 AM

## 2017-02-27 NOTE — Progress Notes (Signed)
Pt is being discharged today. IV x1 removed, all belongings packed and returned to her. Discharge papers were reviewed with the patient, one paper prescription was given to her. She verified understanding. She is currently waiting on her ride. She will be rolled out in a wheelchair by staff.

## 2017-02-28 LAB — HEMOGLOBIN A1C
HEMOGLOBIN A1C: 5.7 % — AB (ref 4.8–5.6)
Mean Plasma Glucose: 117 mg/dL

## 2017-03-02 ENCOUNTER — Institutional Professional Consult (permissible substitution): Payer: Medicare Other | Admitting: Radiation Oncology

## 2017-03-02 DIAGNOSIS — C3412 Malignant neoplasm of upper lobe, left bronchus or lung: Secondary | ICD-10-CM | POA: Diagnosis not present

## 2017-03-04 ENCOUNTER — Encounter: Payer: Self-pay | Admitting: Oncology

## 2017-03-09 ENCOUNTER — Encounter: Payer: Self-pay | Admitting: Obstetrics and Gynecology

## 2017-03-11 ENCOUNTER — Inpatient Hospital Stay: Payer: Self-pay | Admitting: Internal Medicine

## 2017-03-11 ENCOUNTER — Encounter: Payer: Self-pay | Admitting: Oncology

## 2017-03-11 DIAGNOSIS — C3490 Malignant neoplasm of unspecified part of unspecified bronchus or lung: Secondary | ICD-10-CM | POA: Insufficient documentation

## 2017-03-11 DIAGNOSIS — C3412 Malignant neoplasm of upper lobe, left bronchus or lung: Secondary | ICD-10-CM | POA: Diagnosis not present

## 2017-03-11 DIAGNOSIS — C3492 Malignant neoplasm of unspecified part of left bronchus or lung: Secondary | ICD-10-CM

## 2017-03-11 DIAGNOSIS — R911 Solitary pulmonary nodule: Secondary | ICD-10-CM | POA: Insufficient documentation

## 2017-03-11 HISTORY — DX: Malignant neoplasm of unspecified part of left bronchus or lung: C34.92

## 2017-03-12 ENCOUNTER — Other Ambulatory Visit: Payer: Self-pay | Admitting: *Deleted

## 2017-03-12 ENCOUNTER — Inpatient Hospital Stay: Payer: Medicare Other | Attending: Oncology

## 2017-03-12 ENCOUNTER — Inpatient Hospital Stay: Payer: Medicare Other | Admitting: Oncology

## 2017-03-12 ENCOUNTER — Ambulatory Visit: Payer: Medicare Other | Admitting: Oncology

## 2017-03-12 DIAGNOSIS — C3492 Malignant neoplasm of unspecified part of left bronchus or lung: Secondary | ICD-10-CM

## 2017-03-16 ENCOUNTER — Ambulatory Visit (INDEPENDENT_AMBULATORY_CARE_PROVIDER_SITE_OTHER): Payer: Medicare Other | Admitting: Internal Medicine

## 2017-03-16 ENCOUNTER — Encounter: Payer: Self-pay | Admitting: Internal Medicine

## 2017-03-16 VITALS — BP 134/80 | HR 82 | Ht 64.0 in | Wt 138.4 lb

## 2017-03-16 DIAGNOSIS — F5102 Adjustment insomnia: Secondary | ICD-10-CM | POA: Diagnosis not present

## 2017-03-16 DIAGNOSIS — I639 Cerebral infarction, unspecified: Secondary | ICD-10-CM | POA: Diagnosis not present

## 2017-03-16 DIAGNOSIS — J438 Other emphysema: Secondary | ICD-10-CM

## 2017-03-16 DIAGNOSIS — C3412 Malignant neoplasm of upper lobe, left bronchus or lung: Secondary | ICD-10-CM | POA: Diagnosis not present

## 2017-03-16 MED ORDER — ALBUTEROL SULFATE 1.25 MG/3ML IN NEBU: 1.0000 | INHALATION_SOLUTION | Freq: Four times a day (QID) | RESPIRATORY_TRACT | 12 refills | Status: DC | PRN

## 2017-03-16 MED ORDER — SIMVASTATIN 40 MG PO TABS: 40.0000 mg | ORAL_TABLET | Freq: Every day | ORAL | 5 refills | Status: DC

## 2017-03-16 MED ORDER — TRAZODONE HCL 50 MG PO TABS: 25.0000 mg | ORAL_TABLET | Freq: Every day | ORAL | 1 refills | Status: AC

## 2017-03-16 NOTE — Progress Notes (Signed)
Date:  03/16/2017   Name:  Paige Hart   DOB:  Jun 03, 1935   MRN:  [id number]   Chief Complaint: Hospitalization Follow-up Admitted to [facility] 02/26/17 to 02/27/17. # Acute stroke   MRI conformed stroke, Carotid doppler showed <50% blockages.   Echo is also done.   Pt is seen by PT and Neurologist.   No need after d./c   Started on simvastatin and ASA, follow with PMD>  #Stage IV lung cancer Recently diagnosed 1 week ago Follow-up with the oncologist Dr.[email or web address]  #Essential hypertension Continue Cozaar  #Chronic history of COPD and exacerbation Nebulizer treatments as needed  Insomnia  Primary symptoms: sleep disturbance, premature morning awakening.  The problem occurs nightly.  COPD - on symbicort and albuterol MDI.  Feeling generally or short of breath than usual. Now on oxygen around the clock. She used a nebulizer while in the hospital and felt that this provided more benefit. She does not have a nebulizer but her sister-in-law does have one she can borrow. Lung cancer - this is a new diagnosis. Biopsy at [facility] had insufficient sample size for additional testing. She's decided to go to [facility] for treatment. She has a consultation tomorrow with the pulmonologist to consider another biopsy and also with radiation therapy.  Review of Systems  Constitutional: Negative for chills, fatigue and fever.  HENT: Negative for trouble swallowing.   Respiratory: Positive for cough, shortness of breath and wheezing. Negative for chest tightness.   Cardiovascular: Negative for chest pain, palpitations and leg swelling.  Gastrointestinal: Negative for abdominal pain.  Neurological: Negative for dizziness and headaches.  Psychiatric/Behavioral: Positive for sleep disturbance. Negative for confusion and dysphoric mood. The patient has insomnia.     Patient Active Problem List   Diagnosis Date Noted  . Primary lung adenocarcinoma, left ([place]) 03/11/2017  . Nodule of right lung 03/11/2017  .  Stroke (cerebrum) ([place]) 02/27/2017  . Acute CVA (cerebrovascular accident) ([place]) 02/26/2017  . Goals of care, counseling/discussion 02/19/2017  . Primary adenocarcinoma of upper lobe of left lung ([place]) 02/14/2017  . SOB (shortness of breath) 02/07/2017  . Ulcerative colitis ([place]) 09/02/2016  . Blood in stool   . Rectal polyp   . Polyp of sigmoid colon   . Benign neoplasm of descending colon   . Benign neoplasm of transverse colon   . Benign neoplasm of cecum   . BRBPR (bright red blood per rectum) 08/05/2016  . Chronic idiopathic constipation 07/09/2016  . Acute cystitis with hematuria 07/09/2016  . Bleeding hemorrhoid 07/03/2016  . Cystocele 12/27/2015  . Uterine procidentia 12/27/2015  . Pessary maintenance 03/22/2015  . Allergic conjunctivitis 11/17/2014  . Allergic rhinitis 11/17/2014  . Essential (primary) hypertension 11/17/2014  . Graves disease 11/17/2014  . Bursitis of hip 11/17/2014  . Combined fat and carbohydrate induced hyperlipemia 11/17/2014  . Chronic obstructive pulmonary emphysema ([place]) 11/17/2014  . Pelvic relaxation due to uterovaginal prolapse - pessary use 11/17/2014    Prior to Admission medications   Medication Sig Start Date End Date Taking? Authorizing Provider  acetaminophen (TYLENOL) 500 MG tablet Take 1,000 mg by mouth every 6 (six) hours as needed (for pain.).   Yes [provider]  albuterol (PROVENTIL HFA;VENTOLIN HFA) 108 (90 Base) MCG/ACT inhaler Inhale 2 puffs into the lungs every 4 (four) hours as needed for wheezing or shortness of breath. 03/27/16  Yes Glean Hess, MD  aspirin EC 81 MG tablet Take 1 tablet (81 mg total) by mouth daily. 02/27/17 02/27/18  Yes Vaughan Basta, MD  budesonide-formoterol [facility]) 160-4.5 MCG/ACT inhaler Inhale 2 puffs into the lungs 2 (two) times daily. 03/27/16  Yes Glean Hess, MD  docusate sodium (COLACE) 100 MG capsule Take 1 capsule (100 mg total) by mouth every other day. Patient  taking differently: Take 100 mg by mouth daily.  07/07/16 07/07/17 Yes Harvest Dark, MD  ESTRACE VAGINAL 0.1 MG/GM vaginal cream INSERT 0.5GRAMS VAGINALLY ONCE WEEKLY Patient taking differently: INSERT 0.5GRAMS VAGINALLY twice weekly on monday and friday 08/07/15  Yes Defrancesco, Alanda Slim, MD  guaiFENesin-dextromethorphan (ROBITUSSIN DM) 100-10 MG/5ML syrup Take 5 mLs by mouth every 4 (four) hours as needed for cough. 01/25/17  Yes Dustin Flock, MD  losartan (COZAAR) 100 MG tablet Take 1 tablet (100 mg total) by mouth daily. 07/03/16  Yes Glean Hess, MD  MULTIPLE VITAMINS-MINERALS PO Take 1 tablet by mouth daily.   Yes [provider]  simvastatin (ZOCOR) 40 MG tablet Take 1 tablet (40 mg total) by mouth daily at 6 PM. 02/27/17  Yes Vaughan Basta, MD    No Known Allergies  Past Surgical History:  Procedure Laterality Date  . COLONOSCOPY WITH PROPOFOL N/A 08/11/2016   Procedure: COLONOSCOPY WITH PROPOFOL;  Surgeon: [name], MD;  Location: [place];  Service: Endoscopy;  Laterality: N/A;  . FLEXIBLE BRONCHOSCOPY N/A 02/12/2017   Procedure: FLEXIBLE BRONCHOSCOPY;  Surgeon: Wilhelmina Mcardle, MD;  Location: [facility];  Service: Pulmonary;  Laterality: N/A;  . OOPHORECTOMY Left 1970  . POLYPECTOMY  08/11/2016   Procedure: POLYPECTOMY;  Surgeon: [name], MD;  Location: [place];  Service: Endoscopy;;    Social History  Substance Use Topics  . Smoking status: Former Smoker    Quit date: 07/28/2004  . Smokeless tobacco: Never Used  . Alcohol use No     Medication list has been reviewed and updated.   Physical Exam  Constitutional: She is oriented to person, place, and time. She appears well-developed. No distress.  HENT:  Head: Normocephalic and atraumatic.  Cardiovascular: Normal rate, regular rhythm and normal heart sounds.   Pulmonary/Chest: Effort normal. No respiratory distress. She has decreased breath sounds. She has wheezes.  She has no rales. She exhibits no tenderness.  Abdominal: Normal appearance. There is no tenderness.  Musculoskeletal: Normal range of motion.  Neurological: She is alert and oriented to person, place, and time. She has normal strength and normal reflexes. No sensory deficit. Gait normal.  Skin: Skin is warm and dry. No rash noted.  Psychiatric: She has a normal mood and affect. Her speech is normal and behavior is normal. Thought content normal.  Nursing note and vitals reviewed.   BP 134/80   Pulse 82   Ht _0  (1.626 m)   Wt 138 lb 6.4 oz (62.8 kg)   SpO2 97%   BMI 23.76 kg/m   Assessment and Plan: 1. Other emphysema ([place]) Continue symbicort Add nebulizer qid in place of albuterol MDI Elevate HOB on bricks - albuterol (ACCUNEB) 1.25 MG/3ML nebulizer solution; Take 3 mLs (1.25 mg total) by nebulization every 6 (six) hours as needed for wheezing.  Dispense: 120 mL; Refill: 12  2. Primary adenocarcinoma of upper lobe of left lung ([place]) Seeing [facility] tomorrow for additional consultation and planning  3. Acute CVA (cerebrovascular accident) ([place]) Continue statin and aspirin for CVA prevention - simvastatin (ZOCOR) 40 MG tablet; Take 1 tablet (40 mg total) by mouth daily at 6 PM.  Dispense: 30 tablet; Refill: 5  4.  Adjustment insomnia Add low dose trazodone - traZODone (DESYREL) 50 MG tablet; Take 0.5 tablets (25 mg total) by mouth at bedtime.  Dispense: 15 tablet; Refill: 1   Meds ordered this encounter  Medications  . simvastatin (ZOCOR) 40 MG tablet    Sig: Take 1 tablet (40 mg total) by mouth daily at 6 PM.    Dispense:  30 tablet    Refill:  5  . albuterol (ACCUNEB) 1.25 MG/3ML nebulizer solution    Sig: Take 3 mLs (1.25 mg total) by nebulization every 6 (six) hours as needed for wheezing.    Dispense:  120 mL    Refill:  12  . traZODone (DESYREL) 50 MG tablet    Sig: Take 0.5 tablets (25 mg total) by mouth at bedtime.    Dispense:  15 tablet    Refill:  [place], MD [facility]  03/16/2017

## 2017-03-17 DIAGNOSIS — J381 Polyp of vocal cord and larynx: Secondary | ICD-10-CM | POA: Diagnosis not present

## 2017-03-17 DIAGNOSIS — C3412 Malignant neoplasm of upper lobe, left bronchus or lung: Secondary | ICD-10-CM | POA: Diagnosis not present

## 2017-03-17 DIAGNOSIS — Z7951 Long term (current) use of inhaled steroids: Secondary | ICD-10-CM | POA: Diagnosis not present

## 2017-03-17 DIAGNOSIS — Z8673 Personal history of transient ischemic attack (TIA), and cerebral infarction without residual deficits: Secondary | ICD-10-CM | POA: Diagnosis not present

## 2017-03-17 DIAGNOSIS — J439 Emphysema, unspecified: Secondary | ICD-10-CM | POA: Diagnosis not present

## 2017-03-17 DIAGNOSIS — Z87891 Personal history of nicotine dependence: Secondary | ICD-10-CM | POA: Diagnosis not present

## 2017-03-17 DIAGNOSIS — Z79899 Other long term (current) drug therapy: Secondary | ICD-10-CM | POA: Diagnosis not present

## 2017-03-17 DIAGNOSIS — R0600 Dyspnea, unspecified: Secondary | ICD-10-CM | POA: Diagnosis not present

## 2017-03-17 DIAGNOSIS — C7931 Secondary malignant neoplasm of brain: Secondary | ICD-10-CM | POA: Diagnosis not present

## 2017-03-17 DIAGNOSIS — I10 Essential (primary) hypertension: Secondary | ICD-10-CM | POA: Diagnosis not present

## 2017-03-17 DIAGNOSIS — Z7982 Long term (current) use of aspirin: Secondary | ICD-10-CM | POA: Diagnosis not present

## 2017-03-17 DIAGNOSIS — J449 Chronic obstructive pulmonary disease, unspecified: Secondary | ICD-10-CM | POA: Diagnosis not present

## 2017-03-17 DIAGNOSIS — R590 Localized enlarged lymph nodes: Secondary | ICD-10-CM | POA: Diagnosis not present

## 2017-03-18 DIAGNOSIS — J439 Emphysema, unspecified: Secondary | ICD-10-CM | POA: Diagnosis not present

## 2017-03-18 DIAGNOSIS — I10 Essential (primary) hypertension: Secondary | ICD-10-CM | POA: Diagnosis not present

## 2017-03-18 DIAGNOSIS — E785 Hyperlipidemia, unspecified: Secondary | ICD-10-CM | POA: Diagnosis not present

## 2017-03-18 DIAGNOSIS — Z01818 Encounter for other preprocedural examination: Secondary | ICD-10-CM | POA: Diagnosis not present

## 2017-03-18 DIAGNOSIS — R7309 Other abnormal glucose: Secondary | ICD-10-CM | POA: Diagnosis not present

## 2017-03-18 DIAGNOSIS — I639 Cerebral infarction, unspecified: Secondary | ICD-10-CM | POA: Diagnosis not present

## 2017-03-20 DIAGNOSIS — C3490 Malignant neoplasm of unspecified part of unspecified bronchus or lung: Secondary | ICD-10-CM | POA: Diagnosis not present

## 2017-03-20 DIAGNOSIS — G9389 Other specified disorders of brain: Secondary | ICD-10-CM | POA: Diagnosis not present

## 2017-03-24 ENCOUNTER — Encounter: Payer: Self-pay | Admitting: Oncology

## 2017-03-25 DIAGNOSIS — C3490 Malignant neoplasm of unspecified part of unspecified bronchus or lung: Secondary | ICD-10-CM | POA: Diagnosis not present

## 2017-03-25 DIAGNOSIS — J441 Chronic obstructive pulmonary disease with (acute) exacerbation: Secondary | ICD-10-CM | POA: Diagnosis not present

## 2017-03-25 DIAGNOSIS — Z8673 Personal history of transient ischemic attack (TIA), and cerebral infarction without residual deficits: Secondary | ICD-10-CM | POA: Diagnosis not present

## 2017-03-25 DIAGNOSIS — R911 Solitary pulmonary nodule: Secondary | ICD-10-CM | POA: Diagnosis not present

## 2017-03-25 DIAGNOSIS — J439 Emphysema, unspecified: Secondary | ICD-10-CM | POA: Diagnosis not present

## 2017-03-25 DIAGNOSIS — I10 Essential (primary) hypertension: Secondary | ICD-10-CM | POA: Diagnosis not present

## 2017-03-25 DIAGNOSIS — Z87891 Personal history of nicotine dependence: Secondary | ICD-10-CM | POA: Diagnosis not present

## 2017-03-25 DIAGNOSIS — E039 Hypothyroidism, unspecified: Secondary | ICD-10-CM | POA: Diagnosis not present

## 2017-03-25 DIAGNOSIS — Z7982 Long term (current) use of aspirin: Secondary | ICD-10-CM | POA: Diagnosis not present

## 2017-03-25 DIAGNOSIS — C3412 Malignant neoplasm of upper lobe, left bronchus or lung: Secondary | ICD-10-CM | POA: Diagnosis not present

## 2017-03-25 DIAGNOSIS — Z79899 Other long term (current) drug therapy: Secondary | ICD-10-CM | POA: Diagnosis not present

## 2017-03-27 DIAGNOSIS — J439 Emphysema, unspecified: Secondary | ICD-10-CM | POA: Diagnosis not present

## 2017-03-27 DIAGNOSIS — R5383 Other fatigue: Secondary | ICD-10-CM | POA: Diagnosis not present

## 2017-03-27 DIAGNOSIS — Z510 Encounter for antineoplastic radiation therapy: Secondary | ICD-10-CM | POA: Diagnosis not present

## 2017-03-27 DIAGNOSIS — C3412 Malignant neoplasm of upper lobe, left bronchus or lung: Secondary | ICD-10-CM | POA: Diagnosis not present

## 2017-03-27 DIAGNOSIS — C7931 Secondary malignant neoplasm of brain: Secondary | ICD-10-CM | POA: Diagnosis not present

## 2017-03-27 DIAGNOSIS — C3490 Malignant neoplasm of unspecified part of unspecified bronchus or lung: Secondary | ICD-10-CM | POA: Diagnosis not present

## 2017-03-31 DIAGNOSIS — J439 Emphysema, unspecified: Secondary | ICD-10-CM | POA: Diagnosis not present

## 2017-03-31 DIAGNOSIS — C7931 Secondary malignant neoplasm of brain: Secondary | ICD-10-CM | POA: Diagnosis not present

## 2017-03-31 DIAGNOSIS — Z510 Encounter for antineoplastic radiation therapy: Secondary | ICD-10-CM | POA: Diagnosis not present

## 2017-03-31 DIAGNOSIS — C3482 Malignant neoplasm of overlapping sites of left bronchus and lung: Secondary | ICD-10-CM | POA: Diagnosis not present

## 2017-03-31 DIAGNOSIS — R5383 Other fatigue: Secondary | ICD-10-CM | POA: Diagnosis not present

## 2017-03-31 DIAGNOSIS — C3490 Malignant neoplasm of unspecified part of unspecified bronchus or lung: Secondary | ICD-10-CM | POA: Diagnosis not present

## 2017-04-01 DIAGNOSIS — R5383 Other fatigue: Secondary | ICD-10-CM | POA: Diagnosis not present

## 2017-04-01 DIAGNOSIS — Z510 Encounter for antineoplastic radiation therapy: Secondary | ICD-10-CM | POA: Diagnosis not present

## 2017-04-01 DIAGNOSIS — J439 Emphysema, unspecified: Secondary | ICD-10-CM | POA: Diagnosis not present

## 2017-04-01 DIAGNOSIS — C3490 Malignant neoplasm of unspecified part of unspecified bronchus or lung: Secondary | ICD-10-CM | POA: Diagnosis not present

## 2017-04-01 DIAGNOSIS — C7931 Secondary malignant neoplasm of brain: Secondary | ICD-10-CM | POA: Diagnosis not present

## 2017-04-02 DIAGNOSIS — J439 Emphysema, unspecified: Secondary | ICD-10-CM | POA: Diagnosis not present

## 2017-04-02 DIAGNOSIS — Z510 Encounter for antineoplastic radiation therapy: Secondary | ICD-10-CM | POA: Diagnosis not present

## 2017-04-02 DIAGNOSIS — R5383 Other fatigue: Secondary | ICD-10-CM | POA: Diagnosis not present

## 2017-04-02 DIAGNOSIS — C7931 Secondary malignant neoplasm of brain: Secondary | ICD-10-CM | POA: Diagnosis not present

## 2017-04-02 DIAGNOSIS — C3490 Malignant neoplasm of unspecified part of unspecified bronchus or lung: Secondary | ICD-10-CM | POA: Diagnosis not present

## 2017-04-03 DIAGNOSIS — J439 Emphysema, unspecified: Secondary | ICD-10-CM | POA: Diagnosis not present

## 2017-04-03 DIAGNOSIS — C7931 Secondary malignant neoplasm of brain: Secondary | ICD-10-CM | POA: Diagnosis not present

## 2017-04-03 DIAGNOSIS — Z510 Encounter for antineoplastic radiation therapy: Secondary | ICD-10-CM | POA: Diagnosis not present

## 2017-04-03 DIAGNOSIS — R5383 Other fatigue: Secondary | ICD-10-CM | POA: Diagnosis not present

## 2017-04-03 DIAGNOSIS — C3490 Malignant neoplasm of unspecified part of unspecified bronchus or lung: Secondary | ICD-10-CM | POA: Diagnosis not present

## 2017-04-06 DIAGNOSIS — J439 Emphysema, unspecified: Secondary | ICD-10-CM | POA: Diagnosis not present

## 2017-04-06 DIAGNOSIS — C3490 Malignant neoplasm of unspecified part of unspecified bronchus or lung: Secondary | ICD-10-CM | POA: Diagnosis not present

## 2017-04-06 DIAGNOSIS — R5383 Other fatigue: Secondary | ICD-10-CM | POA: Diagnosis not present

## 2017-04-06 DIAGNOSIS — C7931 Secondary malignant neoplasm of brain: Secondary | ICD-10-CM | POA: Diagnosis not present

## 2017-04-06 DIAGNOSIS — Z510 Encounter for antineoplastic radiation therapy: Secondary | ICD-10-CM | POA: Diagnosis not present

## 2017-04-07 ENCOUNTER — Ambulatory Visit (INDEPENDENT_AMBULATORY_CARE_PROVIDER_SITE_OTHER): Payer: Medicare Other | Admitting: Internal Medicine

## 2017-04-07 ENCOUNTER — Telehealth: Payer: Self-pay

## 2017-04-07 ENCOUNTER — Encounter: Payer: Self-pay | Admitting: Internal Medicine

## 2017-04-07 VITALS — BP 116/58 | HR 90 | Temp 98.5°F | Ht 64.0 in

## 2017-04-07 DIAGNOSIS — R1310 Dysphagia, unspecified: Secondary | ICD-10-CM | POA: Insufficient documentation

## 2017-04-07 DIAGNOSIS — C3490 Malignant neoplasm of unspecified part of unspecified bronchus or lung: Secondary | ICD-10-CM | POA: Diagnosis not present

## 2017-04-07 DIAGNOSIS — C3492 Malignant neoplasm of unspecified part of left bronchus or lung: Secondary | ICD-10-CM | POA: Diagnosis not present

## 2017-04-07 DIAGNOSIS — J438 Other emphysema: Secondary | ICD-10-CM | POA: Diagnosis not present

## 2017-04-07 DIAGNOSIS — I639 Cerebral infarction, unspecified: Secondary | ICD-10-CM | POA: Diagnosis not present

## 2017-04-07 DIAGNOSIS — R5383 Other fatigue: Secondary | ICD-10-CM | POA: Diagnosis not present

## 2017-04-07 DIAGNOSIS — R1319 Other dysphagia: Secondary | ICD-10-CM

## 2017-04-07 DIAGNOSIS — C3482 Malignant neoplasm of overlapping sites of left bronchus and lung: Secondary | ICD-10-CM | POA: Diagnosis not present

## 2017-04-07 DIAGNOSIS — Z510 Encounter for antineoplastic radiation therapy: Secondary | ICD-10-CM | POA: Diagnosis not present

## 2017-04-07 DIAGNOSIS — J439 Emphysema, unspecified: Secondary | ICD-10-CM | POA: Diagnosis not present

## 2017-04-07 DIAGNOSIS — C7931 Secondary malignant neoplasm of brain: Secondary | ICD-10-CM | POA: Diagnosis not present

## 2017-04-07 MED ORDER — RANITIDINE HCL 150 MG PO TABS: 150.0000 mg | ORAL_TABLET | Freq: Two times a day (BID) | ORAL | 2 refills | Status: AC

## 2017-04-07 MED ORDER — GUAIFENESIN-CODEINE 100-10 MG/5ML PO SYRP: 5.0000 mL | ORAL_SOLUTION | Freq: Every evening | ORAL | 0 refills | Status: DC | PRN

## 2017-04-07 NOTE — Telephone Encounter (Signed)
Patient called stating she believes she has bronchitis. Coughing so much cannot sleep at night- wants antibiotics called into pharmacy or needs appt.   Please call patient and schedule appt to be seen. We cannot send in antibiotics without seeing the patient to evaluate first.   Callback#: [phone]  Thanks.

## 2017-04-07 NOTE — Patient Instructions (Signed)
Use nebulizer right before bedtime.  Begin zantac 150 mg twice a day to suppress reflux.  Take robitussin with codeine 1 tsp at bedtime to suppress cough.

## 2017-04-07 NOTE — Progress Notes (Signed)
Date:  04/07/2017   Name:  Paige Hart   DOB:  May 29, 1935   MRN:  [id number]   Chief Complaint: Cough (Cough keeping her up at night. As soon as laying down gets worse. Little mucous coming up. Cough has been there for the last few months but has gotten worse. Its affecting her breathing. ) Patient is currently undergoing radiation therapy for lung cancer. She also has known COPD for which he uses a nebulizer and inhaler. She tried [employer] without benefit. Codeine to patient and her son, her cough is dry and tight worsened by lying down but nonproductive. She denies fever or chills. She is slightly more short of breath with activity. Appetite is good. No difficulty swallowing. She does have occasional reflux with acid taste in the back of her throat. She elevated the head of her bed which has helped to some extent.   Cough  This is a chronic problem. The current episode started more than 1 month ago. The problem has been waxing and waning. The problem occurs hourly. The cough is non-productive. Associated symptoms include shortness of breath. Pertinent negatives include no chest pain, chills, ear pain, fever, hemoptysis, postnasal drip or wheezing. The symptoms are aggravated by lying down.      Review of Systems  Constitutional: Negative for chills and fever.  HENT: Negative for ear pain and postnasal drip.   Respiratory: Positive for cough, chest tightness and shortness of breath. Negative for hemoptysis and wheezing.   Cardiovascular: Negative for chest pain, palpitations and leg swelling.  Gastrointestinal: Negative for abdominal pain and blood in stool.       Mild intermittent reflux and waterbrash  Psychiatric/Behavioral: Positive for sleep disturbance.    Patient Active Problem List   Diagnosis Date Noted  . Adjustment insomnia 03/16/2017  . Primary lung adenocarcinoma, left ([place]) 03/11/2017  . Nodule of right lung 03/11/2017  . Stroke (cerebrum) ([place]) 02/27/2017  .  Acute CVA (cerebrovascular accident) ([place]) 02/26/2017  . Goals of care, counseling/discussion 02/19/2017  . Primary adenocarcinoma of upper lobe of left lung ([place]) 02/14/2017  . SOB (shortness of breath) 02/07/2017  . Ulcerative colitis ([place]) 09/02/2016  . Blood in stool   . Rectal polyp   . Polyp of sigmoid colon   . Benign neoplasm of descending colon   . Benign neoplasm of transverse colon   . Benign neoplasm of cecum   . BRBPR (bright red blood per rectum) 08/05/2016  . Chronic idiopathic constipation 07/09/2016  . Acute cystitis with hematuria 07/09/2016  . Bleeding hemorrhoid 07/03/2016  . Cystocele 12/27/2015  . Uterine procidentia 12/27/2015  . Pessary maintenance 03/22/2015  . Allergic conjunctivitis 11/17/2014  . Allergic rhinitis 11/17/2014  . Essential (primary) hypertension 11/17/2014  . Graves disease 11/17/2014  . Bursitis of hip 11/17/2014  . Combined fat and carbohydrate induced hyperlipemia 11/17/2014  . Chronic obstructive pulmonary emphysema ([place]) 11/17/2014  . Pelvic relaxation due to uterovaginal prolapse - pessary use 11/17/2014    Prior to Admission medications   Medication Sig Start Date End Date Taking? Authorizing Provider  acetaminophen (TYLENOL) 500 MG tablet Take 1,000 mg by mouth every 6 (six) hours as needed (for pain.).   Yes [provider]  albuterol (ACCUNEB) 1.25 MG/3ML nebulizer solution Take 3 mLs (1.25 mg total) by nebulization every 6 (six) hours as needed for wheezing. 03/16/17  Yes Glean Hess, MD  albuterol (PROVENTIL HFA;VENTOLIN HFA) 108 (90 Base) MCG/ACT inhaler Inhale 2 puffs into the lungs  every 4 (four) hours as needed for wheezing or shortness of breath. 03/27/16  Yes Glean Hess, MD  aspirin EC 81 MG tablet Take 1 tablet (81 mg total) by mouth daily. 02/27/17 02/27/18 Yes Vaughan Basta, MD  budesonide-formoterol [facility]) 160-4.5 MCG/ACT inhaler Inhale 2 puffs into the lungs 2 (two) times daily. 03/27/16   Yes Glean Hess, MD  docusate sodium (COLACE) 100 MG capsule Take 1 capsule (100 mg total) by mouth every other day. Patient taking differently: Take 100 mg by mouth daily.  07/07/16 07/07/17 Yes Paduchowski, Lennette Bihari, MD  ESTRACE VAGINAL 0.1 MG/GM vaginal cream INSERT 0.5GRAMS VAGINALLY ONCE WEEKLY Patient taking differently: INSERT 0.5GRAMS VAGINALLY twice weekly on monday and friday 08/07/15  Yes Defrancesco, Alanda Slim, MD  losartan (COZAAR) 100 MG tablet Take 1 tablet (100 mg total) by mouth daily. 07/03/16  Yes Glean Hess, MD  MULTIPLE VITAMINS-MINERALS PO Take 1 tablet by mouth daily.   Yes [provider]  simvastatin (ZOCOR) 40 MG tablet Take 1 tablet (40 mg total) by mouth daily at 6 PM. 03/16/17  Yes Glean Hess, MD  traZODone (DESYREL) 50 MG tablet Take 0.5 tablets (25 mg total) by mouth at bedtime. 03/16/17  Yes Glean Hess, MD    No Known Allergies  Past Surgical History:  Procedure Laterality Date  . COLONOSCOPY WITH PROPOFOL N/A 08/11/2016   Procedure: COLONOSCOPY WITH PROPOFOL;  Surgeon: [name], MD;  Location: [place];  Service: Endoscopy;  Laterality: N/A;  . FLEXIBLE BRONCHOSCOPY N/A 02/12/2017   Procedure: FLEXIBLE BRONCHOSCOPY;  Surgeon: Wilhelmina Mcardle, MD;  Location: [facility];  Service: Pulmonary;  Laterality: N/A;  . OOPHORECTOMY Left 1970  . POLYPECTOMY  08/11/2016   Procedure: POLYPECTOMY;  Surgeon: [name], MD;  Location: [place];  Service: Endoscopy;;    Social History  Substance Use Topics  . Smoking status: Former Smoker    Quit date: 07/28/2004  . Smokeless tobacco: Never Used  . Alcohol use No     Medication list has been reviewed and updated.  PHQ 2/9 Scores 04/07/2017 03/27/2016 01/18/2015  PHQ - 2 Score 0 0 0    Physical Exam  Constitutional: She appears well-developed and well-nourished. No distress.  Neck: Normal range of motion. Neck supple. No edema and no erythema present. No  thyromegaly present.  Cardiovascular: Normal rate and regular rhythm.   Murmur heard.  Systolic murmur is present with a grade of 3/6  Pulmonary/Chest: She has decreased breath sounds. She has no wheezes. She has no rhonchi.  Abdominal: Soft. Normal appearance. There is no tenderness.  Neurological: She is alert. She has normal reflexes.  Skin: Skin is warm and dry. She is not diaphoretic.    BP (!) 116/58   Pulse 90   Temp 98.5 F (36.9 C)   Ht _0  (1.626 m)   SpO2 94%   Assessment and Plan: 1. Primary lung adenocarcinoma, left ([place]) Undergoing XRT prior to resection/chemotx  2. Other emphysema ([place]) With multi-factorial cough suspected Use nebulizer at bedtime along with robitussin w/codeine Begin Zantac bid to protect esophagus - guaiFENesin-codeine (ROBITUSSIN AC) 100-10 MG/5ML syrup; Take 5 mLs by mouth at bedtime as needed for cough.  Dispense: 150 mL; Refill: 0  3. Esophageal dysphagia - ranitidine (ZANTAC) 150 MG tablet; Take 1 tablet (150 mg total) by mouth 2 (two) times daily.  Dispense: 60 tablet; Refill: 2   Meds ordered this encounter  Medications  . ranitidine (ZANTAC) 150 MG  tablet    Sig: Take 1 tablet (150 mg total) by mouth 2 (two) times daily.    Dispense:  60 tablet    Refill:  2  . guaiFENesin-codeine (ROBITUSSIN AC) 100-10 MG/5ML syrup    Sig: Take 5 mLs by mouth at bedtime as needed for cough.    Dispense:  150 mL    Refill:  0    Partially dictated using Editor, commissioning. Any errors are unintentional.  Halina Maidens, MD [facility]  04/07/2017

## 2017-04-08 DIAGNOSIS — C7931 Secondary malignant neoplasm of brain: Secondary | ICD-10-CM | POA: Diagnosis not present

## 2017-04-08 DIAGNOSIS — Z510 Encounter for antineoplastic radiation therapy: Secondary | ICD-10-CM | POA: Diagnosis not present

## 2017-04-08 DIAGNOSIS — J439 Emphysema, unspecified: Secondary | ICD-10-CM | POA: Diagnosis not present

## 2017-04-08 DIAGNOSIS — C3490 Malignant neoplasm of unspecified part of unspecified bronchus or lung: Secondary | ICD-10-CM | POA: Diagnosis not present

## 2017-04-08 DIAGNOSIS — R5383 Other fatigue: Secondary | ICD-10-CM | POA: Diagnosis not present

## 2017-04-09 DIAGNOSIS — R5383 Other fatigue: Secondary | ICD-10-CM | POA: Diagnosis not present

## 2017-04-09 DIAGNOSIS — J439 Emphysema, unspecified: Secondary | ICD-10-CM | POA: Diagnosis not present

## 2017-04-09 DIAGNOSIS — C3490 Malignant neoplasm of unspecified part of unspecified bronchus or lung: Secondary | ICD-10-CM | POA: Diagnosis not present

## 2017-04-09 DIAGNOSIS — C7931 Secondary malignant neoplasm of brain: Secondary | ICD-10-CM | POA: Diagnosis not present

## 2017-04-09 DIAGNOSIS — Z510 Encounter for antineoplastic radiation therapy: Secondary | ICD-10-CM | POA: Diagnosis not present

## 2017-04-13 ENCOUNTER — Telehealth: Payer: Self-pay

## 2017-04-13 ENCOUNTER — Other Ambulatory Visit: Payer: Self-pay | Admitting: Internal Medicine

## 2017-04-13 DIAGNOSIS — R5383 Other fatigue: Secondary | ICD-10-CM | POA: Diagnosis not present

## 2017-04-13 DIAGNOSIS — Z510 Encounter for antineoplastic radiation therapy: Secondary | ICD-10-CM | POA: Diagnosis not present

## 2017-04-13 DIAGNOSIS — J438 Other emphysema: Secondary | ICD-10-CM

## 2017-04-13 DIAGNOSIS — J439 Emphysema, unspecified: Secondary | ICD-10-CM | POA: Diagnosis not present

## 2017-04-13 DIAGNOSIS — C7931 Secondary malignant neoplasm of brain: Secondary | ICD-10-CM | POA: Diagnosis not present

## 2017-04-13 DIAGNOSIS — C3490 Malignant neoplasm of unspecified part of unspecified bronchus or lung: Secondary | ICD-10-CM | POA: Diagnosis not present

## 2017-04-13 MED ORDER — GUAIFENESIN-CODEINE 100-10 MG/5ML PO SYRP: 5.0000 mL | ORAL_SOLUTION | Freq: Three times a day (TID) | ORAL | 0 refills | Status: DC | PRN

## 2017-04-13 NOTE — Telephone Encounter (Signed)
Patient called and left VM-   When went to [facility] they told her they can only fill first Rx for 7 days ( Robitussin AC ). They gave her Rx for the 7 days. Then told her she can do a second Rx and get the full dosing. Needs new Rx sent to [facility] in [place]. Please Advise.

## 2017-04-13 NOTE — Telephone Encounter (Signed)
New Rx faxed.

## 2017-04-14 DIAGNOSIS — C3490 Malignant neoplasm of unspecified part of unspecified bronchus or lung: Secondary | ICD-10-CM | POA: Diagnosis not present

## 2017-04-14 DIAGNOSIS — R5383 Other fatigue: Secondary | ICD-10-CM | POA: Diagnosis not present

## 2017-04-14 DIAGNOSIS — J439 Emphysema, unspecified: Secondary | ICD-10-CM | POA: Diagnosis not present

## 2017-04-14 DIAGNOSIS — C7931 Secondary malignant neoplasm of brain: Secondary | ICD-10-CM | POA: Diagnosis not present

## 2017-04-14 DIAGNOSIS — Z510 Encounter for antineoplastic radiation therapy: Secondary | ICD-10-CM | POA: Diagnosis not present

## 2017-04-14 NOTE — Telephone Encounter (Signed)
Patient informed of Rx sent.

## 2017-04-15 DIAGNOSIS — C3490 Malignant neoplasm of unspecified part of unspecified bronchus or lung: Secondary | ICD-10-CM | POA: Diagnosis not present

## 2017-04-15 DIAGNOSIS — R5383 Other fatigue: Secondary | ICD-10-CM | POA: Diagnosis not present

## 2017-04-15 DIAGNOSIS — C7931 Secondary malignant neoplasm of brain: Secondary | ICD-10-CM | POA: Diagnosis not present

## 2017-04-15 DIAGNOSIS — C3482 Malignant neoplasm of overlapping sites of left bronchus and lung: Secondary | ICD-10-CM | POA: Diagnosis not present

## 2017-04-15 DIAGNOSIS — J439 Emphysema, unspecified: Secondary | ICD-10-CM | POA: Diagnosis not present

## 2017-04-15 DIAGNOSIS — Z510 Encounter for antineoplastic radiation therapy: Secondary | ICD-10-CM | POA: Diagnosis not present

## 2017-04-20 ENCOUNTER — Other Ambulatory Visit: Payer: Self-pay | Admitting: Internal Medicine

## 2017-04-20 DIAGNOSIS — J438 Other emphysema: Secondary | ICD-10-CM

## 2017-04-22 DIAGNOSIS — C3490 Malignant neoplasm of unspecified part of unspecified bronchus or lung: Secondary | ICD-10-CM | POA: Diagnosis not present

## 2017-04-29 DIAGNOSIS — I700 Atherosclerosis of aorta: Secondary | ICD-10-CM | POA: Diagnosis not present

## 2017-04-29 DIAGNOSIS — R911 Solitary pulmonary nodule: Secondary | ICD-10-CM | POA: Diagnosis not present

## 2017-04-29 DIAGNOSIS — I2510 Atherosclerotic heart disease of native coronary artery without angina pectoris: Secondary | ICD-10-CM | POA: Diagnosis not present

## 2017-04-29 DIAGNOSIS — R590 Localized enlarged lymph nodes: Secondary | ICD-10-CM | POA: Diagnosis not present

## 2017-04-29 DIAGNOSIS — C3490 Malignant neoplasm of unspecified part of unspecified bronchus or lung: Secondary | ICD-10-CM | POA: Diagnosis not present

## 2017-05-05 ENCOUNTER — Ambulatory Visit (INDEPENDENT_AMBULATORY_CARE_PROVIDER_SITE_OTHER): Payer: Medicare Other

## 2017-05-05 DIAGNOSIS — Z23 Encounter for immunization: Secondary | ICD-10-CM

## 2017-05-06 DIAGNOSIS — C3412 Malignant neoplasm of upper lobe, left bronchus or lung: Secondary | ICD-10-CM | POA: Diagnosis not present

## 2017-05-06 DIAGNOSIS — Z79899 Other long term (current) drug therapy: Secondary | ICD-10-CM | POA: Diagnosis not present

## 2017-05-06 DIAGNOSIS — Z5111 Encounter for antineoplastic chemotherapy: Secondary | ICD-10-CM | POA: Diagnosis not present

## 2017-05-27 DIAGNOSIS — C3490 Malignant neoplasm of unspecified part of unspecified bronchus or lung: Secondary | ICD-10-CM | POA: Diagnosis not present

## 2017-05-27 DIAGNOSIS — R9089 Other abnormal findings on diagnostic imaging of central nervous system: Secondary | ICD-10-CM | POA: Diagnosis not present

## 2017-05-27 DIAGNOSIS — Z7951 Long term (current) use of inhaled steroids: Secondary | ICD-10-CM | POA: Diagnosis not present

## 2017-05-27 DIAGNOSIS — Z5111 Encounter for antineoplastic chemotherapy: Secondary | ICD-10-CM | POA: Diagnosis not present

## 2017-05-27 DIAGNOSIS — Z8041 Family history of malignant neoplasm of ovary: Secondary | ICD-10-CM | POA: Diagnosis not present

## 2017-05-27 DIAGNOSIS — Z801 Family history of malignant neoplasm of trachea, bronchus and lung: Secondary | ICD-10-CM | POA: Diagnosis not present

## 2017-05-27 DIAGNOSIS — R911 Solitary pulmonary nodule: Secondary | ICD-10-CM | POA: Diagnosis not present

## 2017-05-27 DIAGNOSIS — Z87891 Personal history of nicotine dependence: Secondary | ICD-10-CM | POA: Diagnosis not present

## 2017-05-27 DIAGNOSIS — Z8701 Personal history of pneumonia (recurrent): Secondary | ICD-10-CM | POA: Diagnosis not present

## 2017-05-27 DIAGNOSIS — Z7982 Long term (current) use of aspirin: Secondary | ICD-10-CM | POA: Diagnosis not present

## 2017-05-27 DIAGNOSIS — C3412 Malignant neoplasm of upper lobe, left bronchus or lung: Secondary | ICD-10-CM | POA: Diagnosis not present

## 2017-05-27 DIAGNOSIS — Z79899 Other long term (current) drug therapy: Secondary | ICD-10-CM | POA: Diagnosis not present

## 2017-05-27 DIAGNOSIS — Z9981 Dependence on supplemental oxygen: Secondary | ICD-10-CM | POA: Diagnosis not present

## 2017-05-27 DIAGNOSIS — R634 Abnormal weight loss: Secondary | ICD-10-CM | POA: Diagnosis not present

## 2017-05-27 DIAGNOSIS — Z515 Encounter for palliative care: Secondary | ICD-10-CM | POA: Diagnosis not present

## 2017-05-27 DIAGNOSIS — Z9289 Personal history of other medical treatment: Secondary | ICD-10-CM | POA: Diagnosis not present

## 2017-05-27 DIAGNOSIS — I10 Essential (primary) hypertension: Secondary | ICD-10-CM | POA: Diagnosis not present

## 2017-05-27 DIAGNOSIS — J449 Chronic obstructive pulmonary disease, unspecified: Secondary | ICD-10-CM | POA: Diagnosis not present

## 2017-05-27 DIAGNOSIS — G939 Disorder of brain, unspecified: Secondary | ICD-10-CM | POA: Diagnosis not present

## 2017-05-27 DIAGNOSIS — R590 Localized enlarged lymph nodes: Secondary | ICD-10-CM | POA: Diagnosis not present

## 2017-06-10 DIAGNOSIS — R590 Localized enlarged lymph nodes: Secondary | ICD-10-CM | POA: Diagnosis not present

## 2017-06-10 DIAGNOSIS — Z8673 Personal history of transient ischemic attack (TIA), and cerebral infarction without residual deficits: Secondary | ICD-10-CM | POA: Diagnosis not present

## 2017-06-10 DIAGNOSIS — R5383 Other fatigue: Secondary | ICD-10-CM | POA: Diagnosis not present

## 2017-06-10 DIAGNOSIS — J449 Chronic obstructive pulmonary disease, unspecified: Secondary | ICD-10-CM | POA: Diagnosis not present

## 2017-06-10 DIAGNOSIS — R930 Abnormal findings on diagnostic imaging of skull and head, not elsewhere classified: Secondary | ICD-10-CM | POA: Diagnosis not present

## 2017-06-10 DIAGNOSIS — C3490 Malignant neoplasm of unspecified part of unspecified bronchus or lung: Secondary | ICD-10-CM | POA: Diagnosis not present

## 2017-06-10 DIAGNOSIS — Z510 Encounter for antineoplastic radiation therapy: Secondary | ICD-10-CM | POA: Diagnosis not present

## 2017-06-10 DIAGNOSIS — C3412 Malignant neoplasm of upper lobe, left bronchus or lung: Secondary | ICD-10-CM | POA: Diagnosis not present

## 2017-06-10 DIAGNOSIS — Z87891 Personal history of nicotine dependence: Secondary | ICD-10-CM | POA: Diagnosis not present

## 2017-06-10 DIAGNOSIS — I10 Essential (primary) hypertension: Secondary | ICD-10-CM | POA: Diagnosis not present

## 2017-06-10 DIAGNOSIS — Z9221 Personal history of antineoplastic chemotherapy: Secondary | ICD-10-CM | POA: Diagnosis not present

## 2017-06-10 DIAGNOSIS — C7931 Secondary malignant neoplasm of brain: Secondary | ICD-10-CM | POA: Diagnosis not present

## 2017-06-10 DIAGNOSIS — J8410 Pulmonary fibrosis, unspecified: Secondary | ICD-10-CM | POA: Diagnosis not present

## 2017-06-10 DIAGNOSIS — J439 Emphysema, unspecified: Secondary | ICD-10-CM | POA: Diagnosis not present

## 2017-06-22 DIAGNOSIS — J439 Emphysema, unspecified: Secondary | ICD-10-CM | POA: Diagnosis not present

## 2017-06-22 DIAGNOSIS — Z510 Encounter for antineoplastic radiation therapy: Secondary | ICD-10-CM | POA: Diagnosis not present

## 2017-06-22 DIAGNOSIS — C7931 Secondary malignant neoplasm of brain: Secondary | ICD-10-CM | POA: Diagnosis not present

## 2017-06-22 DIAGNOSIS — C3412 Malignant neoplasm of upper lobe, left bronchus or lung: Secondary | ICD-10-CM | POA: Diagnosis not present

## 2017-06-22 DIAGNOSIS — C3490 Malignant neoplasm of unspecified part of unspecified bronchus or lung: Secondary | ICD-10-CM | POA: Diagnosis not present

## 2017-06-22 DIAGNOSIS — R5383 Other fatigue: Secondary | ICD-10-CM | POA: Diagnosis not present

## 2017-06-23 DIAGNOSIS — C3412 Malignant neoplasm of upper lobe, left bronchus or lung: Secondary | ICD-10-CM | POA: Diagnosis not present

## 2017-06-23 DIAGNOSIS — C7931 Secondary malignant neoplasm of brain: Secondary | ICD-10-CM | POA: Diagnosis not present

## 2017-06-23 DIAGNOSIS — R5383 Other fatigue: Secondary | ICD-10-CM | POA: Diagnosis not present

## 2017-06-23 DIAGNOSIS — J439 Emphysema, unspecified: Secondary | ICD-10-CM | POA: Diagnosis not present

## 2017-06-23 DIAGNOSIS — C3490 Malignant neoplasm of unspecified part of unspecified bronchus or lung: Secondary | ICD-10-CM | POA: Diagnosis not present

## 2017-06-23 DIAGNOSIS — Z510 Encounter for antineoplastic radiation therapy: Secondary | ICD-10-CM | POA: Diagnosis not present

## 2017-06-24 DIAGNOSIS — J439 Emphysema, unspecified: Secondary | ICD-10-CM | POA: Diagnosis not present

## 2017-06-24 DIAGNOSIS — R590 Localized enlarged lymph nodes: Secondary | ICD-10-CM | POA: Diagnosis not present

## 2017-06-24 DIAGNOSIS — C3412 Malignant neoplasm of upper lobe, left bronchus or lung: Secondary | ICD-10-CM | POA: Diagnosis not present

## 2017-06-24 DIAGNOSIS — Z510 Encounter for antineoplastic radiation therapy: Secondary | ICD-10-CM | POA: Diagnosis not present

## 2017-06-24 DIAGNOSIS — C3490 Malignant neoplasm of unspecified part of unspecified bronchus or lung: Secondary | ICD-10-CM | POA: Diagnosis not present

## 2017-06-24 DIAGNOSIS — R935 Abnormal findings on diagnostic imaging of other abdominal regions, including retroperitoneum: Secondary | ICD-10-CM | POA: Diagnosis not present

## 2017-06-24 DIAGNOSIS — R5383 Other fatigue: Secondary | ICD-10-CM | POA: Diagnosis not present

## 2017-06-24 DIAGNOSIS — J701 Chronic and other pulmonary manifestations due to radiation: Secondary | ICD-10-CM | POA: Diagnosis not present

## 2017-06-24 DIAGNOSIS — C7931 Secondary malignant neoplasm of brain: Secondary | ICD-10-CM | POA: Diagnosis not present

## 2017-07-08 DIAGNOSIS — C3412 Malignant neoplasm of upper lobe, left bronchus or lung: Secondary | ICD-10-CM | POA: Diagnosis not present

## 2017-07-08 DIAGNOSIS — Z5111 Encounter for antineoplastic chemotherapy: Secondary | ICD-10-CM | POA: Diagnosis not present

## 2017-07-08 DIAGNOSIS — Z79899 Other long term (current) drug therapy: Secondary | ICD-10-CM | POA: Diagnosis not present

## 2017-07-24 ENCOUNTER — Other Ambulatory Visit: Payer: Self-pay | Admitting: Internal Medicine

## 2017-07-24 DIAGNOSIS — I10 Essential (primary) hypertension: Secondary | ICD-10-CM

## 2017-07-24 NOTE — Telephone Encounter (Signed)
Could not lvm to set up appt.

## 2017-08-05 DIAGNOSIS — Z801 Family history of malignant neoplasm of trachea, bronchus and lung: Secondary | ICD-10-CM | POA: Diagnosis not present

## 2017-08-05 DIAGNOSIS — Z5111 Encounter for antineoplastic chemotherapy: Secondary | ICD-10-CM | POA: Diagnosis not present

## 2017-08-05 DIAGNOSIS — R1012 Left upper quadrant pain: Secondary | ICD-10-CM | POA: Diagnosis not present

## 2017-08-05 DIAGNOSIS — R634 Abnormal weight loss: Secondary | ICD-10-CM | POA: Diagnosis not present

## 2017-08-05 DIAGNOSIS — J449 Chronic obstructive pulmonary disease, unspecified: Secondary | ICD-10-CM | POA: Diagnosis not present

## 2017-08-05 DIAGNOSIS — Z8041 Family history of malignant neoplasm of ovary: Secondary | ICD-10-CM | POA: Diagnosis not present

## 2017-08-05 DIAGNOSIS — Z8701 Personal history of pneumonia (recurrent): Secondary | ICD-10-CM | POA: Diagnosis not present

## 2017-08-05 DIAGNOSIS — Z7951 Long term (current) use of inhaled steroids: Secondary | ICD-10-CM | POA: Diagnosis not present

## 2017-08-05 DIAGNOSIS — R590 Localized enlarged lymph nodes: Secondary | ICD-10-CM | POA: Diagnosis not present

## 2017-08-05 DIAGNOSIS — Z79899 Other long term (current) drug therapy: Secondary | ICD-10-CM | POA: Diagnosis not present

## 2017-08-05 DIAGNOSIS — Z9981 Dependence on supplemental oxygen: Secondary | ICD-10-CM | POA: Diagnosis not present

## 2017-08-05 DIAGNOSIS — C3412 Malignant neoplasm of upper lobe, left bronchus or lung: Secondary | ICD-10-CM | POA: Diagnosis not present

## 2017-08-05 DIAGNOSIS — D7389 Other diseases of spleen: Secondary | ICD-10-CM | POA: Diagnosis not present

## 2017-08-05 DIAGNOSIS — Z9289 Personal history of other medical treatment: Secondary | ICD-10-CM | POA: Diagnosis not present

## 2017-08-05 DIAGNOSIS — Z87891 Personal history of nicotine dependence: Secondary | ICD-10-CM | POA: Diagnosis not present

## 2017-08-05 DIAGNOSIS — Z923 Personal history of irradiation: Secondary | ICD-10-CM | POA: Diagnosis not present

## 2017-08-26 DIAGNOSIS — C3412 Malignant neoplasm of upper lobe, left bronchus or lung: Secondary | ICD-10-CM | POA: Diagnosis not present

## 2017-08-26 DIAGNOSIS — R918 Other nonspecific abnormal finding of lung field: Secondary | ICD-10-CM | POA: Diagnosis not present

## 2017-08-26 DIAGNOSIS — K5730 Diverticulosis of large intestine without perforation or abscess without bleeding: Secondary | ICD-10-CM | POA: Diagnosis not present

## 2017-08-26 DIAGNOSIS — J9811 Atelectasis: Secondary | ICD-10-CM | POA: Diagnosis not present

## 2017-08-26 DIAGNOSIS — D259 Leiomyoma of uterus, unspecified: Secondary | ICD-10-CM | POA: Diagnosis not present

## 2017-08-27 DIAGNOSIS — K5900 Constipation, unspecified: Secondary | ICD-10-CM | POA: Diagnosis not present

## 2017-08-27 DIAGNOSIS — Z8041 Family history of malignant neoplasm of ovary: Secondary | ICD-10-CM | POA: Diagnosis not present

## 2017-08-27 DIAGNOSIS — Z79899 Other long term (current) drug therapy: Secondary | ICD-10-CM | POA: Diagnosis not present

## 2017-08-27 DIAGNOSIS — R918 Other nonspecific abnormal finding of lung field: Secondary | ICD-10-CM | POA: Diagnosis not present

## 2017-08-27 DIAGNOSIS — C7931 Secondary malignant neoplasm of brain: Secondary | ICD-10-CM | POA: Diagnosis not present

## 2017-08-27 DIAGNOSIS — Z8701 Personal history of pneumonia (recurrent): Secondary | ICD-10-CM | POA: Diagnosis not present

## 2017-08-27 DIAGNOSIS — R1012 Left upper quadrant pain: Secondary | ICD-10-CM | POA: Diagnosis not present

## 2017-08-27 DIAGNOSIS — J9811 Atelectasis: Secondary | ICD-10-CM | POA: Diagnosis not present

## 2017-08-27 DIAGNOSIS — Z9289 Personal history of other medical treatment: Secondary | ICD-10-CM | POA: Diagnosis not present

## 2017-08-27 DIAGNOSIS — J449 Chronic obstructive pulmonary disease, unspecified: Secondary | ICD-10-CM | POA: Diagnosis not present

## 2017-08-27 DIAGNOSIS — R143 Flatulence: Secondary | ICD-10-CM | POA: Diagnosis not present

## 2017-08-27 DIAGNOSIS — Z801 Family history of malignant neoplasm of trachea, bronchus and lung: Secondary | ICD-10-CM | POA: Diagnosis not present

## 2017-08-27 DIAGNOSIS — Z87891 Personal history of nicotine dependence: Secondary | ICD-10-CM | POA: Diagnosis not present

## 2017-08-27 DIAGNOSIS — Z5111 Encounter for antineoplastic chemotherapy: Secondary | ICD-10-CM | POA: Diagnosis not present

## 2017-08-27 DIAGNOSIS — C3412 Malignant neoplasm of upper lobe, left bronchus or lung: Secondary | ICD-10-CM | POA: Diagnosis not present

## 2017-08-27 DIAGNOSIS — R590 Localized enlarged lymph nodes: Secondary | ICD-10-CM | POA: Diagnosis not present

## 2017-08-27 DIAGNOSIS — I10 Essential (primary) hypertension: Secondary | ICD-10-CM | POA: Diagnosis not present

## 2017-08-27 DIAGNOSIS — D7389 Other diseases of spleen: Secondary | ICD-10-CM | POA: Diagnosis not present

## 2017-08-27 DIAGNOSIS — Z7951 Long term (current) use of inhaled steroids: Secondary | ICD-10-CM | POA: Diagnosis not present

## 2017-09-02 ENCOUNTER — Encounter: Payer: Self-pay | Admitting: Obstetrics and Gynecology

## 2017-09-02 ENCOUNTER — Ambulatory Visit (INDEPENDENT_AMBULATORY_CARE_PROVIDER_SITE_OTHER): Payer: Medicare Other | Admitting: Obstetrics and Gynecology

## 2017-09-02 VITALS — BP 146/71 | HR 102 | Ht 64.0 in | Wt 138.6 lb

## 2017-09-02 DIAGNOSIS — Z4689 Encounter for fitting and adjustment of other specified devices: Secondary | ICD-10-CM | POA: Diagnosis not present

## 2017-09-02 DIAGNOSIS — R300 Dysuria: Secondary | ICD-10-CM

## 2017-09-02 DIAGNOSIS — N813 Complete uterovaginal prolapse: Secondary | ICD-10-CM

## 2017-09-02 LAB — POCT URINALYSIS DIPSTICK
GLUCOSE UA: NEGATIVE
Ketones, UA: NEGATIVE
Nitrite, UA: NEGATIVE
Odor: NEGATIVE
SPEC GRAV UA: 1.025 (ref 1.010–1.025)
Urobilinogen, UA: 0.2 E.U./dL
pH, UA: 5 (ref 5.0–8.0)

## 2017-09-02 MED ORDER — ESTRADIOL 0.1 MG/GM VA CREA: TOPICAL_CREAM | VAGINAL | 1 refills | Status: AC

## 2017-09-02 MED ORDER — OXYQUINOLINE-SOD LAURYL SULF 0.025-0.01 % VA GEL: 0.0250 mL | VAGINAL | 3 refills | Status: AC

## 2017-09-02 NOTE — Progress Notes (Signed)
Chief complaint: 1.  Pessary maintenance 2.  Uterine procidentia 3.  New comorbidities including stage IV lung cancer and history of CVA  Paige Hart presents for pessary maintenance.  She is now 6 months status post last evaluation.  Interval health issues include newly diagnosed stage IV breast cancer, status post chemotherapy and radiation therapy; she also was diagnosed with a cerebrovascular accident without active sequela at this time.  Paige Hart reports no significant vaginal discharge, vaginal spotting, or vaginal odor. Bowel function is normal except when she gets chemotherapy-induced constipation. Bladder function is normal.  Past Medical History:  Diagnosis Date  . Asthma   . Colitis    left sided  . Cystocele   . Emphysema/COPD ([place])   . Hypertension   . Lung cancer ([place])   . Primary lung adenocarcinoma, left ([place]) 03/11/2017  . Procidentia of uterus   . Urge incontinence   . Vaginal atrophy   . Wears dentures    full upper and lower   Past Surgical History:  Procedure Laterality Date  . COLONOSCOPY WITH PROPOFOL N/A 08/11/2016   Procedure: COLONOSCOPY WITH PROPOFOL;  Surgeon: [name], MD;  Location: [place];  Service: Endoscopy;  Laterality: N/A;  . FLEXIBLE BRONCHOSCOPY N/A 02/12/2017   Procedure: FLEXIBLE BRONCHOSCOPY;  Surgeon: Wilhelmina Mcardle, MD;  Location: [facility];  Service: Pulmonary;  Laterality: N/A;  . OOPHORECTOMY Left 1970  . POLYPECTOMY  08/11/2016   Procedure: POLYPECTOMY;  Surgeon: [name], MD;  Location: [place];  Service: Endoscopy;;   OBJECTIVE: BP (!) 146/71   Pulse (!) 102   Ht _0  (1.626 m)   Wt 138 lb 9.6 oz (62.9 kg)   BMI 23.79 kg/m  Pleasant elderly female in no acute distress; slight tachypnea, on supplemental nasal cannula oxygen Abdomen: Soft, nontender Pelvic exam: External genitalia-normal BUS-normal Vagina-gel horn pessary is visualized with speculum exam; vaginal mucosa appears healthy without  significant drainage or erosion; gel horn pessary is not removed.  ASSESSMENT: 1.  Pessary maintenance-normal 2.  Uterine procidentia 3.  Recent diagnosis of stage IV lung cancer and CVA, status post chemotherapy and radiation therapy; currently on maintenance chemo  PLAN: 1.  Patient is to return in 3 months for pessary maintenance 2.  Due to the patient's comorbidities and lack of symptoms, the pessary was not removed today.  Future assessments will be dependent upon patient's symptomatology as to whether the pessary needs removal. 3.  Continue Trimosan gel intravaginal once a week 4.  Continue Premarin cream intravaginal once a week  A total of 15 minutes were spent face-to-face with the patient during this encounter and over half of that time dealt with counseling and coordination of care.  Brayton Mars, MD  Note: This dictation was prepared with Dragon dictation along with smaller phrase technology. Any transcriptional errors that result from this process are unintentional.

## 2017-09-02 NOTE — Patient Instructions (Addendum)
1.  Return in 3 months for pessary maintenance 2.  Continue Trimosan gel intravaginal once a week 3.  Continue Premarin cream intravaginal once a week

## 2017-09-05 LAB — URINE CULTURE

## 2017-09-07 ENCOUNTER — Other Ambulatory Visit: Payer: Self-pay | Admitting: Internal Medicine

## 2017-09-08 ENCOUNTER — Telehealth: Payer: Self-pay

## 2017-09-08 MED ORDER — SULFAMETHOXAZOLE-TRIMETHOPRIM 800-160 MG PO TABS: 1.0000 | ORAL_TABLET | Freq: Two times a day (BID) | ORAL | 0 refills | Status: DC

## 2017-09-08 NOTE — Telephone Encounter (Signed)
-----   Message from Brayton Mars, MD sent at 09/07/2017  8:14 PM EST ----- Please notify - Abnormal Labs Recommend Septra DS twice a day for 3 days

## 2017-09-08 NOTE — Telephone Encounter (Signed)
Pt aware. Med erx. 

## 2017-09-17 DIAGNOSIS — Z79899 Other long term (current) drug therapy: Secondary | ICD-10-CM | POA: Diagnosis not present

## 2017-09-17 DIAGNOSIS — I10 Essential (primary) hypertension: Secondary | ICD-10-CM | POA: Diagnosis not present

## 2017-09-17 DIAGNOSIS — Z5111 Encounter for antineoplastic chemotherapy: Secondary | ICD-10-CM | POA: Diagnosis not present

## 2017-09-17 DIAGNOSIS — J9811 Atelectasis: Secondary | ICD-10-CM | POA: Diagnosis not present

## 2017-09-17 DIAGNOSIS — C3412 Malignant neoplasm of upper lobe, left bronchus or lung: Secondary | ICD-10-CM | POA: Diagnosis not present

## 2017-09-17 DIAGNOSIS — R109 Unspecified abdominal pain: Secondary | ICD-10-CM | POA: Diagnosis not present

## 2017-09-17 DIAGNOSIS — R5383 Other fatigue: Secondary | ICD-10-CM | POA: Diagnosis not present

## 2017-09-17 DIAGNOSIS — Z87891 Personal history of nicotine dependence: Secondary | ICD-10-CM | POA: Diagnosis not present

## 2017-09-17 DIAGNOSIS — J449 Chronic obstructive pulmonary disease, unspecified: Secondary | ICD-10-CM | POA: Diagnosis not present

## 2017-09-17 DIAGNOSIS — R590 Localized enlarged lymph nodes: Secondary | ICD-10-CM | POA: Diagnosis not present

## 2017-09-17 DIAGNOSIS — Z5112 Encounter for antineoplastic immunotherapy: Secondary | ICD-10-CM | POA: Diagnosis not present

## 2017-09-17 DIAGNOSIS — Z7951 Long term (current) use of inhaled steroids: Secondary | ICD-10-CM | POA: Diagnosis not present

## 2017-09-21 ENCOUNTER — Telehealth: Payer: Self-pay

## 2017-09-21 NOTE — Telephone Encounter (Signed)
Called pt to sched AWV w/ [facility]. Unable to reach pt d/t no answer. Added reminder to notes of 10/02/17 appt

## 2017-09-23 DIAGNOSIS — C7801 Secondary malignant neoplasm of right lung: Secondary | ICD-10-CM | POA: Diagnosis not present

## 2017-09-23 DIAGNOSIS — C3490 Malignant neoplasm of unspecified part of unspecified bronchus or lung: Secondary | ICD-10-CM | POA: Diagnosis not present

## 2017-09-23 DIAGNOSIS — J439 Emphysema, unspecified: Secondary | ICD-10-CM | POA: Diagnosis not present

## 2017-09-23 DIAGNOSIS — Z510 Encounter for antineoplastic radiation therapy: Secondary | ICD-10-CM | POA: Diagnosis not present

## 2017-09-23 DIAGNOSIS — C7940 Secondary malignant neoplasm of unspecified part of nervous system: Secondary | ICD-10-CM | POA: Diagnosis not present

## 2017-09-23 DIAGNOSIS — C7931 Secondary malignant neoplasm of brain: Secondary | ICD-10-CM | POA: Diagnosis not present

## 2017-09-23 DIAGNOSIS — R5383 Other fatigue: Secondary | ICD-10-CM | POA: Diagnosis not present

## 2017-09-24 ENCOUNTER — Telehealth: Payer: Self-pay

## 2017-09-24 NOTE — Telephone Encounter (Signed)
Called pt to sched AWV w/ [facility]. Pt scheduled to be seen 09/30/17.

## 2017-09-30 ENCOUNTER — Ambulatory Visit (INDEPENDENT_AMBULATORY_CARE_PROVIDER_SITE_OTHER): Payer: Medicare Other

## 2017-09-30 VITALS — BP 130/60 | HR 86 | Temp 98.2°F | Resp 16 | Ht 64.0 in | Wt 141.6 lb

## 2017-09-30 DIAGNOSIS — Z0000 Encounter for general adult medical examination without abnormal findings: Secondary | ICD-10-CM

## 2017-09-30 NOTE — Patient Instructions (Signed)
[name] , Thank you for taking time to come for your Medicare Wellness Visit. I appreciate your ongoing commitment to your health goals. Please review the following plan we discussed and let me know if I can assist you in the future.   Screening recommendations/referrals: Colorectal Screening: No longer required Mammogram: No longer required Bone Density: No longer required  Vision/Dental Exams: Recommended yearly ophthalmology/optometry visit for glaucoma screening and checkup Recommended yearly dental visit for hygiene and checkup  Vaccinations: Influenza vaccine: Up to date Pneumococcal vaccine: Completed series Tdap vaccine: Declined. Please call your insurance company to determine your out of pocket expense. You may also receive this vaccine at your local pharmacy or Health Dept. Shingles vaccine: Please call your insurance company to determine your out of pocket expense for the Shingrix vaccine. You may also receive this vaccine at your local pharmacy or Health Dept.   Advanced directives: Please bring a copy of your POA (Power of Attorney) and/or Living Will to your next appointment.  Conditions/risks identified: Recommend to drink at least 6-8 8oz glasses of water per day.  Next appointment: You are scheduled to see Dr. Army Melia on 10/02/17 @ 10:15am.   Please schedule your Annual Wellness Visit with your Nurse Health Advisor in one year.  Preventive Care 81 Years and Older, Female Preventive care refers to lifestyle choices and visits with your health care provider that can promote health and wellness. What does preventive care include?  A yearly physical exam. This is also called an annual well check.  Dental exams once or twice a year.  Routine eye exams. Ask your health care provider how often you should have your eyes checked.  Personal lifestyle choices, including:  Daily care of your teeth and gums.  Regular physical activity.  Eating a healthy  diet.  Avoiding tobacco and drug use.  Limiting alcohol use.  Practicing safe sex.  Taking low-dose aspirin every day.  Taking vitamin and mineral supplements as recommended by your health care provider. What happens during an annual well check? The services and screenings done by your health care provider during your annual well check will depend on your age, overall health, lifestyle risk factors, and family history of disease. Counseling  Your health care provider may ask you questions about your:  Alcohol use.  Tobacco use.  Drug use.  Emotional well-being.  Home and relationship well-being.  Sexual activity.  Eating habits.  History of falls.  Memory and ability to understand (cognition).  Work and work Statistician.  Reproductive health. Screening  You may have the following tests or measurements:  Height, weight, and BMI.  Blood pressure.  Lipid and cholesterol levels. These may be checked every 5 years, or more frequently if you are over 80 years old.  Skin check.  Lung cancer screening. You may have this screening every year starting at age 86 if you have a 30-pack-year history of smoking and currently smoke or have quit within the past 15 years.  Fecal occult blood test (FOBT) of the stool. You may have this test every year starting at age 64.  Flexible sigmoidoscopy or colonoscopy. You may have a sigmoidoscopy every 5 years or a colonoscopy every 10 years starting at age 64.  Hepatitis C blood test.  Hepatitis B blood test.  Sexually transmitted disease (STD) testing.  Diabetes screening. This is done by checking your blood sugar (glucose) after you have not eaten for a while (fasting). You may have this done every 1-3 years.  Bone density scan. This is done to screen for osteoporosis. You may have this done starting at age 49.  Mammogram. This may be done every 1-2 years. Talk to your health care provider about how often you should have  regular mammograms. Talk with your health care provider about your test results, treatment options, and if necessary, the need for more tests. Vaccines  Your health care provider may recommend certain vaccines, such as:  Influenza vaccine. This is recommended every year.  Tetanus, diphtheria, and acellular pertussis (Tdap, Td) vaccine. You may need a Td booster every 10 years.  Zoster vaccine. You may need this after age 27.  Pneumococcal 13-valent conjugate (PCV13) vaccine. One dose is recommended after age 15.  Pneumococcal polysaccharide (PPSV23) vaccine. One dose is recommended after age 74. Talk to your health care provider about which screenings and vaccines you need and how often you need them. This information is not intended to replace advice given to you by your health care provider. Make sure you discuss any questions you have with your health care provider. Document Released: 08/10/2015 Document Revised: 04/02/2016 Document Reviewed: 05/15/2015 Elsevier Interactive Patient Education  2017 [place] Prevention in the Home Falls can cause injuries. They can happen to people of all ages. There are many things you can do to make your home safe and to help prevent falls. What can I do on the outside of my home?  Regularly fix the edges of walkways and driveways and fix any cracks.  Remove anything that might make you trip as you walk through a door, such as a raised step or threshold.  Trim any bushes or trees on the path to your home.  Use bright outdoor lighting.  Clear any walking paths of anything that might make someone trip, such as rocks or tools.  Regularly check to see if handrails are loose or broken. Make sure that both sides of any steps have handrails.  Any raised decks and porches should have guardrails on the edges.  Have any leaves, snow, or ice cleared regularly.  Use sand or salt on walking paths during winter.  Clean up any spills in your  garage right away. This includes oil or grease spills. What can I do in the bathroom?  Use night lights.  Install grab bars by the toilet and in the tub and shower. Do not use towel bars as grab bars.  Use non-skid mats or decals in the tub or shower.  If you need to sit down in the shower, use a plastic, non-slip stool.  Keep the floor dry. Clean up any water that spills on the floor as soon as it happens.  Remove soap buildup in the tub or shower regularly.  Attach bath mats securely with double-sided non-slip rug tape.  Do not have throw rugs and other things on the floor that can make you trip. What can I do in the bedroom?  Use night lights.  Make sure that you have a light by your bed that is easy to reach.  Do not use any sheets or blankets that are too big for your bed. They should not hang down onto the floor.  Have a firm chair that has side arms. You can use this for support while you get dressed.  Do not have throw rugs and other things on the floor that can make you trip. What can I do in the kitchen?  Clean up any spills right away.  Avoid walking  on wet floors.  Keep items that you use a lot in easy-to-reach places.  If you need to reach something above you, use a strong step stool that has a grab bar.  Keep electrical cords out of the way.  Do not use floor polish or wax that makes floors slippery. If you must use wax, use non-skid floor wax.  Do not have throw rugs and other things on the floor that can make you trip. What can I do with my stairs?  Do not leave any items on the stairs.  Make sure that there are handrails on both sides of the stairs and use them. Fix handrails that are broken or loose. Make sure that handrails are as long as the stairways.  Check any carpeting to make sure that it is firmly attached to the stairs. Fix any carpet that is loose or worn.  Avoid having throw rugs at the top or bottom of the stairs. If you do have throw  rugs, attach them to the floor with carpet tape.  Make sure that you have a light switch at the top of the stairs and the bottom of the stairs. If you do not have them, ask someone to add them for you. What else can I do to help prevent falls?  Wear shoes that:  Do not have high heels.  Have rubber bottoms.  Are comfortable and fit you well.  Are closed at the toe. Do not wear sandals.  If you use a stepladder:  Make sure that it is fully opened. Do not climb a closed stepladder.  Make sure that both sides of the stepladder are locked into place.  Ask someone to hold it for you, if possible.  Clearly mark and make sure that you can see:  Any grab bars or handrails.  First and last steps.  Where the edge of each step is.  Use tools that help you move around (mobility aids) if they are needed. These include:  Canes.  Walkers.  Scooters.  Crutches.  Turn on the lights when you go into a dark area. Replace any light bulbs as soon as they burn out.  Set up your furniture so you have a clear path. Avoid moving your furniture around.  If any of your floors are uneven, fix them.  If there are any pets around you, be aware of where they are.  Review your medicines with your doctor. Some medicines can make you feel dizzy. This can increase your chance of falling. Ask your doctor what other things that you can do to help prevent falls. This information is not intended to replace advice given to you by your health care provider. Make sure you discuss any questions you have with your health care provider. Document Released: 05/10/2009 Document Revised: 12/20/2015 Document Reviewed: 08/18/2014 Elsevier Interactive Patient Education  2017 [employer].

## 2017-09-30 NOTE — Progress Notes (Signed)
Subjective:   Paige Hart is a 83 y.o. female who presents for Medicare Annual (Subsequent) preventive examination.  Review of Systems:  N/A Cardiac Risk Factors include: advanced age (>32mn, >>1women);dyslipidemia;hypertension;sedentary lifestyle(lung cancer on 2L O2)     Objective:     Vitals: BP 130/60 (BP Location: Right Arm, Patient Position: Sitting, Cuff Size: Normal)   Pulse 86   Temp 98.2 F (36.8 C) (Oral)   Resp 16   Ht _0  (1.626 m)   Wt 141 lb 9.6 oz (64.2 kg)   SpO2 (!) 86% Comment: 2L O2; h/o lung cancer  BMI 24.31 kg/m   Body mass index is 24.31 kg/m.  Advanced Directives 09/30/2017 02/26/2017 02/26/2017 02/19/2017 02/12/2017 02/07/2017 02/07/2017  Does Patient Have a Medical Advance Directive? Yes - _1   Type of Advance Directive [place] will Healthcare Power of [place] will [place] will Healthcare Power of [place]Living will  Does patient want to make changes to medical advance directive? - No - Patient declined - - No - Patient declined No - Patient declined -  Copy of [place] Chart? No - copy requested No - copy requested Yes - No - copy requested No - copy requested No - copy requested  Would patient like information on creating a medical advance directive? - No - Patient declined No - Patient declined - - - -    Tobacco Social History   Tobacco Use  Smoking Status Former Smoker  . Packs/day: 0.50  . Years: 40.00  . Pack years: 20.00  . Types: Cigarettes  . Last attempt to quit: 07/28/2004  . Years since quitting: 13.1  Smokeless Tobacco Never Used  Tobacco Comment   smoking cessation materials not required     Counseling given: No Comment: smoking cessation materials not required   Clinical Intake:  Pre-visit preparation completed: Yes  Pain : No/denies  pain   BMI - recorded: 24.31 Nutritional Status: BMI of 19-24  Normal Nutritional Risks: None Diabetes: No  How often do you need to have someone help you when you read instructions, pamphlets, or other written materials from your doctor or pharmacy?: 1 - Never  Interpreter Needed?: No  Information entered by :: AEversole, LPN  Past Medical History:  Diagnosis Date  . Asthma   . Colitis    left sided  . Cystocele   . [place]D [place]   . Hyperlipidemia   . Hypertension   . Lung cancer ([facility]   . Lung cancer ([facility]   . [place] lung adenocarcinoma, [place] 03/11/[place]  . Procidentia of uterus   . Urge incontinence   . Vaginal atrophy   . Wears dentures    full upper and lower   Past Surgical History:  Procedure Laterality Date  . COLONOSCOPY WITH PROPOFOL N/A 08/11/2016   Procedure: COLONOSCOPY WITH PROPOFOL;  Surgeon: DLucilla Lame MD;  [place] [facility]  Service: Endoscopy;  Laterality: N/A;  . FLEXIBLE BRONCHOSCOPY N/A 02/12/2017   Procedure: FLEXIBLE BRONCHOSCOPY;  Surgeon: SWilhelmina Mcardle MD;  Location: [facility];  Service: Pulmonary;  Laterality: N/A;  . OOPHORECTOMY Left 1970  . POLYPECTOMY  08/11/2016   Procedure: POLYPECTOMY;  Surgeon: DLucilla Lame MD;  [facility]  Service: Endoscopy;;   Family History  Problem Relation Age of Onset  . Ovarian cancer Mother   . Lung cancer  Father   . Diabetes Neg Hx   . Heart disease Neg Hx   . Breast cancer Neg Hx   . Colon cancer Neg Hx    Social History   Socioeconomic History  . Marital status: Widowed    Spouse name: None  . Number of children: 4  . Years of education: None  . Highest education level: 12th grade  Social Needs  . Financial resource strain: Not hard at all  . Food insecurity - worry: Never true  . Food insecurity - inability: Never true  . Transportation needs - medical: No  . Transportation needs - non-medical: No  Occupational History  . Occupation: Retired    Tobacco Use  . Smoking status: Former Smoker    Packs/day: 0.50    Years: 40.00    Pack years: 20.00    Types: Cigarettes    Last attempt to quit: 07/28/2004    Years since quitting: 13.1  . Smokeless tobacco: Never Used  . Tobacco comment: smoking cessation materials not required  Substance and Sexual Activity  . Alcohol use: No    Alcohol/week: 0.0 oz  . Drug use: No  . Sexual activity: Not Currently  Other Topics Concern  . None  Social History Narrative  . None    Outpatient Encounter Medications as of 09/30/2017  Medication Sig  . acetaminophen (TYLENOL) 500 MG tablet Take 1,000 mg by mouth every 6 (six) hours as needed (for pain.).  Marland Kitchen albuterol (ACCUNEB) 1.25 MG/3ML nebulizer solution Take 3 mLs (1.25 mg total) by nebulization every 6 (six) hours as needed for wheezing.  Marland Kitchen albuterol (PROVENTIL) (2.5 MG/3ML) 0.083% nebulizer solution 2.5 mg.  . aspirin EC 81 MG tablet Take 1 tablet (81 mg total) by mouth daily.  . benzonatate (TESSALON) 100 MG capsule Take 100 mg by mouth.  . budesonide-formoterol (SYMBICORT) 160-4.5 MCG/ACT inhaler Inhale 2 puffs into the lungs 2 (two) times daily.  Marland Kitchen dexamethasone (DECADRON) 4 MG tablet Take 4 mg by mouth.  . docusate sodium (COLACE) 100 MG capsule Take 100 mg by mouth.  . estradiol (ESTRACE VAGINAL) 0.1 MG/GM vaginal cream INSERT 0.5GRAMS VAGINALLY ONCE WEEKLY  . ibuprofen (ADVIL,MOTRIN) 400 MG tablet Take 400 mg by mouth.  Marland Kitchen LORazepam (ATIVAN) 1 MG tablet   . losartan (COZAAR) 100 MG tablet TAKE ONE TABLET BY MOUTH ONCE DAILY  . MULTIPLE VITAMINS-MINERALS PO Take 1 tablet by mouth daily.  . Oxyquinoline-Sod Lauryl Sulf (TRIMO-SAN) 0.025-0.01 % GEL Place 0.025 mLs vaginally once a week.  Marland Kitchen PROAIR HFA 108 (90 Base) MCG/ACT inhaler INHALE TWO PUFFS BY MOUTH EVERY 4 HOURS AS NEEDED FOR WHEEZING AND FOR SHORTNESS OF BREATH  . simvastatin (ZOCOR) 40 MG tablet Take 1 tablet (40 mg total) by mouth daily at 6 PM.  . SYMBICORT 160-4.5 MCG/ACT  inhaler INHALE 1 PUFF BY MOUTH TWICE DAILY RINSE  MOUTH  AFTER  EACH  INHALATION  . traZODone (DESYREL) 50 MG tablet Take 0.5 tablets (25 mg total) by mouth at bedtime.  Marland Kitchen guaiFENesin-codeine (ROBITUSSIN AC) 100-10 MG/5ML syrup Take 5 mLs by mouth 3 (three) times daily as needed for cough. (Patient not taking: Reported on 09/30/2017)  . ranitidine (ZANTAC) 150 MG tablet Take 1 tablet (150 mg total) by mouth 2 (two) times daily. (Patient not taking: Reported on 09/30/2017)  . sulfamethoxazole-trimethoprim (BACTRIM DS,SEPTRA DS) 800-160 MG tablet Take 1 tablet by mouth 2 (two) times daily. (Patient not taking: Reported on 09/30/2017)   No facility-administered encounter medications on file  as of 09/30/2017.     Activities of Daily Living In your present state of health, do you have any difficulty performing the following activities: 09/30/2017 02/26/2017  Hearing? N N  Comment denies hearing aids -  Vision? N N  Comment wears eyeglasses -  Difficulty concentrating or making decisions? N N  Walking or climbing stairs? Y Y  Comment avoids d/t shortness of breath -  Dressing or bathing? N N  Doing errands, shopping? N N  Preparing Food and eating ? N -  Comment full set upper and lower dentures -  Using the Toilet? N -  In the past six months, have you accidently leaked urine? N -  Do you have problems with loss of bowel control? N -  Managing your Medications? N -  Managing your Finances? N -  Housekeeping or managing your Housekeeping? N -  Some recent data might be hidden    Patient Care Team: Glean Hess, MD as PCP - General (Family Medicine) Defrancesco, Alanda Slim, MD as Consulting Physician (Obstetrics and Gynecology) Gwyneth Sprout, MD as Consulting Physician (Internal Medicine) Wonda Olds, MD as Consulting Physician (Radiation Oncology)    Assessment:   This is a routine wellness examination for [facility].  Exercise Activities and Dietary recommendations Current Exercise Habits:  Home exercise routine, Type of exercise: strength training/weights, Time (Minutes): 20, Frequency (Times/Week): 3, Weekly Exercise (Minutes/Week): 60, Intensity: Mild, Exercise limited by: respiratory conditions(s)(lung cancer on 2L O2)  Goals    . DIET - INCREASE WATER INTAKE     Recommend to drink at least 6-8 8oz glasses of water per day.    . Reduce calorie intake to 2000 calories per day     She is starting a new diet/cleanse - she only consumes 500 cal two days per week and eats normally on the other days.       Fall Risk Fall Risk  09/30/2017 04/07/2017 03/27/2016 03/24/2016 01/18/2015  Falls in the past year? _0   Comment - - - Emmi Telephone Survey: data to providers prior to load -  Risk for fall due to : Impaired vision - - - -  Risk for fall due to: Comment wears eyeglasses - - - -   Is the patient's home free of loose throw rugs in walkways, pet beds, electrical cords, etc?   Yes Does the patient have any grab bars in the bathroom? No  Does the patient use a shower chair when bathing? Yes Does the patient have any stairs in or around the home? Yes If so, are there any handrails?  Yes Does the patient have adequate lighting?  Yes Does the patient use a cane, walker or w/c? No Does the patient use of an elevated toilet seat?  No  Timed Get Up and Go Performed: Yes. Pt ambulated 10 feet within 8 sec. Gait slow, steady and without the use of an assistive device. No intervention required at this time. Fall risk prevention has been discussed.  Pt declined my offer to send Community Resource Referral to Care Guide for  installation of grab bars in the shower or an elevated toilet seat.  Depression Screen PHQ 2/9 Scores 09/30/2017 09/30/2017 04/07/2017 03/27/2016  PHQ - 2 Score 0 0 0 0  PHQ- 9 Score 0 - - -     Cognitive Function     6CIT Screen 09/30/2017  What Year? 0 points  What month? 0 points  What time? 0 points  Count back from 20 0 points  Months in reverse 0  points  Repeat phrase 4 points  Total Score 4    Immunization History  Administered Date(s) Administered  . Influenza,inj,Quad PF,6+ Mos 05/08/2015, 06/02/2016, 05/05/2017  . Pneumococcal Conjugate-13 01/18/2015  . Pneumococcal Polysaccharide-23 11/16/2013    Qualifies for Shingles Vaccine? Yes. Due for Zostavax or Shingrix vaccine. Education has been provided regarding the importance of this vaccine. Pt has been advised to call her insurance company to determine her out of pocket expense. Advised she may also receive this vaccine at her local pharmacy or Health Dept. Verbalized acceptance and understanding.  Due for Tdap vaccine. Education has been provided regarding the importance of this vaccine, however, pt has been advised that this vaccine is NOT covered by Medicare as a preventative vaccination. She is welcome to receive this vacine today but will be responsible for any out of pocket expenses incurred as a result of receiving this vaccine. Pt has been advised she may receive this vaccine at her local pharmacy or Health Dept. Also advised to provide a copy of her vaccination record if she chooses to receive this vaccine at her local pharmacy. Verbalized acceptance and understanding.  Screening Tests Health Maintenance  Topic Date Due  . TETANUS/TDAP  08/30/1953  . INFLUENZA VACCINE  Completed  . PNA vac Low Risk Adult  Completed  . DEXA SCAN  Discontinued    Cancer Screenings: Lung: Low Dose CT Chest recommended if Age 36-80 years, 30 pack-year currently smoking OR have quit w/in 15years. Patient does not qualify. Breast screening: No longer required  Bone Density: No longer required Colorectal Screening: No longer required  Additional Screenings: Hepatitis B/HIV/Syphillis: Does not qualify Hepatitis C Screening: Does not qualify     Plan:  I have personally reviewed and addressed the Medicare Annual Wellness questionnaire and have noted the following in the patient's  chart:  A. Medical and social history B. Use of alcohol, tobacco or illicit drugs  C. Current medications and supplements D. Functional ability and status E.  Nutritional status F.  Physical activity G. Advance directives H. List of other physicians I.  Hospitalizations, surgeries, and ER visits in previous 12 months J.  [place] such as hearing and vision if needed, cognitive and depression L. Referrals and appointments - none  In addition, I have reviewed and discussed with patient certain preventive protocols, quality metrics, and best practice recommendations. A written personalized care plan for preventive services as well as general preventive health recommendations were provided to patient.  Signed,  Aleatha Borer, LPN Nurse Health Advisor  MD Recommendations: Due for Zostavax or Shingrix vaccine. Education has been provided regarding the importance of this vaccine. Pt has been advised to call her insurance company to determine her out of pocket expense. Advised she may also receive this vaccine at her local pharmacy or Health Dept. Verbalized acceptance and understanding.  Due for Tdap vaccine. Education has been provided regarding the importance of this vaccine, however, pt has been advised that this vaccine is NOT covered by Medicare as a preventative vaccination. She is welcome to receive this vacine today but will be responsible for any out of pocket expenses incurred as a result of receiving this vaccine. Pt has been advised she may receive this vaccine at her local pharmacy or Health Dept. Also advised to provide a copy of her vaccination record if she chooses to receive this vaccine at her local pharmacy. Verbalized acceptance and understanding.

## 2017-10-02 ENCOUNTER — Encounter: Payer: Self-pay | Admitting: Internal Medicine

## 2017-10-02 ENCOUNTER — Ambulatory Visit (INDEPENDENT_AMBULATORY_CARE_PROVIDER_SITE_OTHER): Payer: Medicare Other | Admitting: Internal Medicine

## 2017-10-02 VITALS — BP 130/64 | HR 77 | Ht 64.0 in | Wt 141.0 lb

## 2017-10-02 DIAGNOSIS — I10 Essential (primary) hypertension: Secondary | ICD-10-CM | POA: Diagnosis not present

## 2017-10-02 DIAGNOSIS — C3492 Malignant neoplasm of unspecified part of left bronchus or lung: Secondary | ICD-10-CM

## 2017-10-02 DIAGNOSIS — N3000 Acute cystitis without hematuria: Secondary | ICD-10-CM | POA: Diagnosis not present

## 2017-10-02 DIAGNOSIS — K5190 Ulcerative colitis, unspecified, without complications: Secondary | ICD-10-CM | POA: Diagnosis not present

## 2017-10-02 DIAGNOSIS — J438 Other emphysema: Secondary | ICD-10-CM

## 2017-10-02 LAB — POC URINALYSIS WITH MICROSCOPIC (NON AUTO)MANUAL RESULT
Bilirubin, UA: NEGATIVE
CRYSTALS: 0
EPITHELIAL CELLS, URINE PER MICROSCOPY: 0
Glucose, UA: NEGATIVE
KETONES UA: NEGATIVE
MUCUS UA: 0
NITRITE UA: NEGATIVE
PH UA: 6 (ref 5.0–8.0)
PROTEIN UA: NEGATIVE
RBC: 0 M/uL — AB (ref 4.04–5.48)
SPEC GRAV UA: 1.02 (ref 1.010–1.025)
Urobilinogen, UA: 0.2 E.U./dL
WBC Casts, UA: >50

## 2017-10-02 MED ORDER — SULFAMETHOXAZOLE-TRIMETHOPRIM 800-160 MG PO TABS: 1.0000 | ORAL_TABLET | Freq: Two times a day (BID) | ORAL | 0 refills | Status: AC

## 2017-10-02 NOTE — Progress Notes (Signed)
Date:  10/02/2017   Name:  Paige Hart   DOB:  1935/04/11   MRN:  [id number]   Chief Complaint: Urinary Tract Infection (States seen Dr D at [facility] and was told had uti. Was told to bring urine to make sure infection is cleared. ) and Hypertension  Hypertension  This is a chronic problem. The problem is controlled. Associated symptoms include shortness of breath. Pertinent negatives include no chest pain, headaches or palpitations.  Urinary Tract Infection   This is a new problem. The current episode started 1 to 4 weeks ago. Episode frequency: pt had no sx - UTI found at GYN and treated with 3 days of septra. The patient is experiencing no pain. There has been no fever. Pertinent negatives include no chills or nausea. Improvement on treatment: pt had no sx and still does not so can not tell if there is change.  Lung CA with brain mets - being treated by [facility], now on maintenance therapy.  Recent scans showed lesions shrinking.  Her only side effect is rhinorrhea and watery eyes. COPD - on continuous oxygen. Colitis - has some mild diarrhea after recent scan and contrast.  This has now resolved.  There was no pain or bleeding.  Review of Systems  Constitutional: Negative for chills, fatigue, fever and unexpected weight change.  Eyes: Positive for discharge.  Respiratory: Positive for shortness of breath. Negative for cough, chest tightness and wheezing.   Cardiovascular: Negative for chest pain, palpitations and leg swelling.  Gastrointestinal: Negative for abdominal distention, diarrhea and nausea.  Genitourinary: Negative for difficulty urinating and dysuria.  Neurological: Negative for dizziness, syncope, light-headedness and headaches.  Psychiatric/Behavioral: Negative for dysphoric mood and sleep disturbance. The patient is not nervous/anxious.     Patient Active Problem List   Diagnosis Date Noted  . Esophageal dysphagia 04/07/2017  . Adjustment insomnia 03/16/2017  . Primary lung  adenocarcinoma, left ([place]) 03/11/2017  . Nodule of right lung 03/11/2017  . Stroke (cerebrum) ([place]) 02/27/2017  . Acute CVA (cerebrovascular accident) ([place]) 02/26/2017  . Goals of care, counseling/discussion 02/19/2017  . SOB (shortness of breath) 02/07/2017  . Ulcerative colitis ([place]) 09/02/2016  . Blood in stool   . Rectal polyp   . Polyp of sigmoid colon   . Benign neoplasm of descending colon   . Benign neoplasm of transverse colon   . Benign neoplasm of cecum   . BRBPR (bright red blood per rectum) 08/05/2016  . Chronic idiopathic constipation 07/09/2016  . Acute cystitis with hematuria 07/09/2016  . Bleeding hemorrhoid 07/03/2016  . Cystocele 12/27/2015  . Uterine procidentia 12/27/2015  . Pessary maintenance 03/22/2015  . Allergic conjunctivitis 11/17/2014  . Allergic rhinitis 11/17/2014  . Essential (primary) hypertension 11/17/2014  . Graves disease 11/17/2014  . Bursitis of hip 11/17/2014  . Combined fat and carbohydrate induced hyperlipemia 11/17/2014  . Chronic obstructive pulmonary emphysema ([place]) 11/17/2014  . Pelvic relaxation due to uterovaginal prolapse - pessary use 11/17/2014    Prior to Admission medications   Medication Sig Start Date End Date Taking? Authorizing Provider  acetaminophen (TYLENOL) 500 MG tablet Take 1,000 mg by mouth every 6 (six) hours as needed (for pain.).   Yes [provider]  albuterol (ACCUNEB) 1.25 MG/3ML nebulizer solution Take 3 mLs (1.25 mg total) by nebulization every 6 (six) hours as needed for wheezing. 03/16/17  Yes Glean Hess, MD  albuterol (PROVENTIL) (2.5 MG/3ML) 0.083% nebulizer solution 2.5 mg.   Yes [provider]  aspirin EC 81 MG tablet Take 1 tablet (81 mg total) by mouth daily. 02/27/17 02/27/18 Yes Vaughan Basta, MD  budesonide-formoterol [facility]) 160-4.5 MCG/ACT inhaler Inhale 2 puffs into the lungs 2 (two) times daily. 03/27/16  Yes Glean Hess, MD  dexamethasone (DECADRON) 4 MG  tablet Take 4 mg by mouth. 05/01/17  Yes [provider]  docusate sodium (COLACE) 100 MG capsule Take 100 mg by mouth.   Yes [provider]  estradiol (ESTRACE VAGINAL) 0.1 MG/GM vaginal cream INSERT 0.5GRAMS VAGINALLY ONCE WEEKLY 09/02/17  Yes Defrancesco, Alanda Slim, MD  ibuprofen (ADVIL,MOTRIN) 400 MG tablet Take 400 mg by mouth.   Yes [provider]  LORazepam (ATIVAN) 1 MG tablet  06/10/17  Yes [provider]  losartan (COZAAR) 100 MG tablet TAKE ONE TABLET BY MOUTH ONCE DAILY 07/24/17  Yes Glean Hess, MD  MULTIPLE VITAMINS-MINERALS PO Take 1 tablet by mouth daily.   Yes [provider]  Oxyquinoline-Sod Lauryl Sulf (TRIMO-SAN) 0.025-0.01 % GEL Place 0.025 mLs vaginally once a week. 09/02/17  Yes Defrancesco, Alanda Slim, MD  PROAIR HFA 108 [phone] Base) MCG/ACT inhaler INHALE TWO PUFFS BY MOUTH EVERY 4 HOURS AS NEEDED FOR WHEEZING AND FOR SHORTNESS OF BREATH 04/20/17  Yes Glean Hess, MD  ranitidine (ZANTAC) 150 MG tablet Take 1 tablet (150 mg total) by mouth 2 (two) times daily. 04/07/17  Yes Glean Hess, MD  simvastatin (ZOCOR) 40 MG tablet Take 1 tablet (40 mg total) by mouth daily at 6 PM. 03/16/17  Yes Glean Hess, MD  SYMBICORT 160-4.5 MCG/ACT inhaler INHALE 1 PUFF BY MOUTH TWICE DAILY RINSE  MOUTH  AFTER  EACH  INHALATION 09/07/17  Yes Glean Hess, MD  traZODone (DESYREL) 50 MG tablet Take 0.5 tablets (25 mg total) by mouth at bedtime. 03/16/17  Yes Glean Hess, MD    No Known Allergies  Past Surgical History:  Procedure Laterality Date  . COLONOSCOPY WITH PROPOFOL N/A 08/11/2016   Procedure: COLONOSCOPY WITH PROPOFOL;  Surgeon: [name], MD;  Location: [place];  Service: Endoscopy;  Laterality: N/A;  . FLEXIBLE BRONCHOSCOPY N/A 02/12/2017   Procedure: FLEXIBLE BRONCHOSCOPY;  Surgeon: Wilhelmina Mcardle, MD;  Location: [facility];  Service: Pulmonary;  Laterality: N/A;  . OOPHORECTOMY Left 1970  .  POLYPECTOMY  08/11/2016   Procedure: POLYPECTOMY;  Surgeon: [name], MD;  Location: [place];  Service: Endoscopy;;    Social History   Tobacco Use  . Smoking status: Former Smoker    Packs/day: 0.50    Years: 40.00    Pack years: 20.00    Types: Cigarettes    Last attempt to quit: 07/28/2004    Years since quitting: 13.1  . Smokeless tobacco: Never Used  . Tobacco comment: smoking cessation materials not required  Substance Use Topics  . Alcohol use: No    Alcohol/week: 0.0 oz  . Drug use: No     Medication list has been reviewed and updated.  PHQ 2/9 Scores 09/30/2017 09/30/2017 04/07/2017 03/27/2016  PHQ - 2 Score 0 0 0 0  PHQ- 9 Score 0 - - -    Physical Exam  Constitutional: She is oriented to person, place, and time. She appears well-developed. No distress.  HENT:  Head: Normocephalic and atraumatic.  Neck: Normal range of motion. Neck supple. Carotid bruit is not present. No thyromegaly present.  Cardiovascular: Normal rate, regular rhythm and normal heart sounds.  Pulmonary/Chest: Effort normal and breath sounds  normal. No respiratory distress. She has no decreased breath sounds. She has no wheezes.  Musculoskeletal: Normal range of motion.  Neurological: She is alert and oriented to person, place, and time.  Skin: Skin is warm and dry. No rash noted.  Psychiatric: She has a normal mood and affect. Her speech is normal and behavior is normal. Thought content normal.  Nursing note and vitals reviewed.   BP 130/64   Pulse 77   Ht _0  (1.626 m)   Wt 141 lb (64 kg)   SpO2 92%   BMI 24.20 kg/m   Assessment and Plan: 1. Essential (primary) hypertension controlled  2. Acute cystitis without hematuria Still looks infected - will do 7 days of Bactrim and recheck in 3 weeks - POC urinalysis w microscopic (non auto) - sulfamethoxazole-trimethoprim (BACTRIM DS,SEPTRA DS) 800-160 MG tablet; Take 1 tablet by mouth 2 (two) times daily for 7 days.  Dispense:  14 tablet; Refill: 0  3. Primary lung adenocarcinoma, left [facility]) Doing well - followed by [facility]  4. Other emphysema ([place]) On continuous O2  5. Ulcerative colitis without complications, unspecified location ([place]) Stable without recent sx   Meds ordered this encounter  Medications  . sulfamethoxazole-trimethoprim (BACTRIM DS,SEPTRA DS) 800-160 MG tablet    Sig: Take 1 tablet by mouth 2 (two) times daily for 7 days.    Dispense:  14 tablet    Refill:  0    Partially dictated using Editor, commissioning. Any errors are unintentional.  Halina Maidens, MD [facility]  10/02/2017

## 2017-10-07 DIAGNOSIS — N179 Acute kidney failure, unspecified: Secondary | ICD-10-CM | POA: Diagnosis not present

## 2017-10-07 DIAGNOSIS — C3490 Malignant neoplasm of unspecified part of unspecified bronchus or lung: Secondary | ICD-10-CM | POA: Diagnosis not present

## 2017-10-07 DIAGNOSIS — C3412 Malignant neoplasm of upper lobe, left bronchus or lung: Secondary | ICD-10-CM | POA: Diagnosis not present

## 2017-10-08 ENCOUNTER — Other Ambulatory Visit: Payer: Self-pay

## 2017-10-08 ENCOUNTER — Other Ambulatory Visit: Payer: Self-pay | Admitting: Internal Medicine

## 2017-10-08 DIAGNOSIS — I639 Cerebral infarction, unspecified: Secondary | ICD-10-CM

## 2017-10-08 MED ORDER — SIMVASTATIN 40 MG PO TABS: ORAL_TABLET | ORAL | 0 refills | Status: DC

## 2017-10-10 ENCOUNTER — Other Ambulatory Visit: Payer: Self-pay | Admitting: Internal Medicine

## 2017-10-10 DIAGNOSIS — I10 Essential (primary) hypertension: Secondary | ICD-10-CM

## 2017-10-14 DIAGNOSIS — C3412 Malignant neoplasm of upper lobe, left bronchus or lung: Secondary | ICD-10-CM | POA: Diagnosis not present

## 2017-10-14 DIAGNOSIS — Z5111 Encounter for antineoplastic chemotherapy: Secondary | ICD-10-CM | POA: Diagnosis not present

## 2017-10-23 ENCOUNTER — Other Ambulatory Visit: Payer: Self-pay | Admitting: Internal Medicine

## 2017-10-23 ENCOUNTER — Ambulatory Visit (INDEPENDENT_AMBULATORY_CARE_PROVIDER_SITE_OTHER): Payer: Medicare Other | Admitting: Internal Medicine

## 2017-10-23 ENCOUNTER — Encounter: Payer: Self-pay | Admitting: Internal Medicine

## 2017-10-23 VITALS — BP 144/68 | HR 105 | Ht 64.0 in | Wt 141.0 lb

## 2017-10-23 DIAGNOSIS — C3492 Malignant neoplasm of unspecified part of left bronchus or lung: Secondary | ICD-10-CM | POA: Diagnosis not present

## 2017-10-23 DIAGNOSIS — I639 Cerebral infarction, unspecified: Secondary | ICD-10-CM | POA: Diagnosis not present

## 2017-10-23 DIAGNOSIS — J9611 Chronic respiratory failure with hypoxia: Secondary | ICD-10-CM | POA: Insufficient documentation

## 2017-10-23 DIAGNOSIS — N390 Urinary tract infection, site not specified: Secondary | ICD-10-CM

## 2017-10-23 HISTORY — DX: Chronic respiratory failure with hypoxia: J96.11

## 2017-10-23 LAB — POC URINALYSIS WITH MICROSCOPIC (NON AUTO)MANUAL RESULT
BILIRUBIN UA: NEGATIVE
Crystals: 0
EPITHELIAL CELLS, URINE PER MICROSCOPY: 10
Glucose, UA: NEGATIVE
KETONES UA: NEGATIVE
Mucus, UA: 0
NITRITE UA: NEGATIVE
PH UA: 5 (ref 5.0–8.0)
Protein, UA: NEGATIVE
RBC: 0 M/uL — AB (ref 4.04–5.48)
Spec Grav, UA: 1.02 (ref 1.010–1.025)
UROBILINOGEN UA: 0.2 U/dL
WBC Casts, UA: 10

## 2017-10-23 NOTE — Progress Notes (Signed)
Date:  10/23/2017   Name:  Paige Hart   DOB:  12-11-1934   MRN:  [id number]   Chief Complaint: Urinary Tract Infection (Treated with Bactrim. Has been three weeks since start of this. Rechecking UA today for infection. Took meds for 5 days and then was taking off because BP was too low for infusion. )  Urinary Tract Infection   This is a chronic problem. The problem has been resolved. The patient is experiencing no pain. There has been no fever. Pertinent negatives include no chills, hematuria or urgency. She has tried antibiotics for the symptoms. The treatment provided significant relief. and lung cancer  Shortness of Breath  This is a chronic problem. The problem occurs constantly. The problem has been unchanged. Associated symptoms include orthopnea and wheezing. Pertinent negatives include no abdominal pain, chest pain or fever. Treatments tried: Oxygen. Her past medical history is significant for COPD. (And lung cancer)   Lung cancer - stable, fatigue, sob.  On maintenance chemotherapy.  Review of Systems  Constitutional: Positive for fatigue. Negative for chills and fever.  Respiratory: Positive for shortness of breath and wheezing. Negative for chest tightness.   Cardiovascular: Positive for orthopnea. Negative for chest pain and palpitations.  Gastrointestinal: Negative for abdominal pain.  Genitourinary: Negative for dysuria, hematuria and urgency.    Patient Active Problem List   Diagnosis Date Noted  . Esophageal dysphagia 04/07/2017  . Adjustment insomnia 03/16/2017  . Primary lung adenocarcinoma, left ([place]) 03/11/2017  . Nodule of right lung 03/11/2017  . Stroke (cerebrum) ([place]) 02/27/2017  . Acute CVA (cerebrovascular accident) ([place]) 02/26/2017  . Goals of care, counseling/discussion 02/19/2017  . SOB (shortness of breath) 02/07/2017  . Ulcerative colitis ([place]) 09/02/2016  . Blood in stool   . Rectal polyp   . Polyp of sigmoid colon   . Benign neoplasm of  descending colon   . Benign neoplasm of transverse colon   . Benign neoplasm of cecum   . BRBPR (bright red blood per rectum) 08/05/2016  . Chronic idiopathic constipation 07/09/2016  . Acute cystitis with hematuria 07/09/2016  . Bleeding hemorrhoid 07/03/2016  . Cystocele 12/27/2015  . Uterine procidentia 12/27/2015  . Pessary maintenance 03/22/2015  . Allergic conjunctivitis 11/17/2014  . Allergic rhinitis 11/17/2014  . Essential (primary) hypertension 11/17/2014  . Graves disease 11/17/2014  . Bursitis of hip 11/17/2014  . Combined fat and carbohydrate induced hyperlipemia 11/17/2014  . Chronic obstructive pulmonary emphysema ([place]) 11/17/2014  . Pelvic relaxation due to uterovaginal prolapse - pessary use 11/17/2014    Prior to Admission medications   Medication Sig Start Date End Date Taking? Authorizing Provider  acetaminophen (TYLENOL) 500 MG tablet Take 1,000 mg by mouth every 6 (six) hours as needed (for pain.).   Yes [provider]  albuterol (ACCUNEB) 1.25 MG/3ML nebulizer solution Take 3 mLs (1.25 mg total) by nebulization every 6 (six) hours as needed for wheezing. 03/16/17  Yes Glean Hess, MD  albuterol (PROVENTIL) (2.5 MG/3ML) 0.083% nebulizer solution 2.5 mg.   Yes [provider]  aspirin EC 81 MG tablet Take 1 tablet (81 mg total) by mouth daily. 02/27/17 02/27/18 Yes Vaughan Basta, MD  budesonide-formoterol [facility]) 160-4.5 MCG/ACT inhaler Inhale 2 puffs into the lungs 2 (two) times daily. 03/27/16  Yes Glean Hess, MD  dexamethasone (DECADRON) 4 MG tablet Take 4 mg by mouth. 05/01/17  Yes [provider]  docusate sodium (COLACE) 100 MG capsule Take 100 mg by mouth.  Yes [provider]  estradiol (ESTRACE VAGINAL) 0.1 MG/GM vaginal cream INSERT 0.5GRAMS VAGINALLY ONCE WEEKLY 09/02/17  Yes Defrancesco, Alanda Slim, MD  ibuprofen (ADVIL,MOTRIN) 400 MG tablet Take 400 mg by mouth.   Yes [provider]    LORazepam (ATIVAN) 1 MG tablet  06/10/17  Yes [provider]  losartan (COZAAR) 100 MG tablet TAKE ONE TABLET BY MOUTH ONCE DAILY 07/24/17  Yes Glean Hess, MD  MULTIPLE VITAMINS-MINERALS PO Take 1 tablet by mouth daily.   Yes [provider]  Oxyquinoline-Sod Lauryl Sulf (TRIMO-SAN) 0.025-0.01 % GEL Place 0.025 mLs vaginally once a week. 09/02/17  Yes Defrancesco, Alanda Slim, MD  PROAIR HFA 108 [phone] Base) MCG/ACT inhaler INHALE TWO PUFFS BY MOUTH EVERY 4 HOURS AS NEEDED FOR WHEEZING AND FOR SHORTNESS OF BREATH 04/20/17  Yes Glean Hess, MD  ranitidine (ZANTAC) 150 MG tablet Take 1 tablet (150 mg total) by mouth 2 (two) times daily. 04/07/17  Yes Glean Hess, MD  simvastatin (ZOCOR) 40 MG tablet TAKE 1 TABLET BY MOUTH ONCE DAILY AT  6  PM 10/08/17  Yes Glean Hess, MD  SYMBICORT 160-4.5 MCG/ACT inhaler INHALE 1 PUFF BY MOUTH TWICE DAILY RINSE  MOUTH  AFTER  EACH  INHALATION 09/07/17  Yes Glean Hess, MD  traZODone (DESYREL) 50 MG tablet Take 0.5 tablets (25 mg total) by mouth at bedtime. 03/16/17  Yes Glean Hess, MD  UNABLE TO FIND Oxygen 2 lpm via Tiburones   Yes [provider]    No Known Allergies  Past Surgical History:  Procedure Laterality Date  . COLONOSCOPY WITH PROPOFOL N/A 08/11/2016   Procedure: COLONOSCOPY WITH PROPOFOL;  Surgeon: [name], MD;  Location: [place];  Service: Endoscopy;  Laterality: N/A;  . FLEXIBLE BRONCHOSCOPY N/A 02/12/2017   Procedure: FLEXIBLE BRONCHOSCOPY;  Surgeon: Wilhelmina Mcardle, MD;  Location: [facility];  Service: Pulmonary;  Laterality: N/A;  . OOPHORECTOMY Left 1970  . POLYPECTOMY  08/11/2016   Procedure: POLYPECTOMY;  Surgeon: [name], MD;  Location: [place];  Service: Endoscopy;;    Social History   Tobacco Use  . Smoking status: Former Smoker    Packs/day: 0.50    Years: 40.00    Pack years: 20.00    Types: Cigarettes    Last attempt to quit: 07/28/2004    Years  since quitting: 13.2  . Smokeless tobacco: Never Used  . Tobacco comment: smoking cessation materials not required  Substance Use Topics  . Alcohol use: No    Alcohol/week: 0.0 oz  . Drug use: No     Medication list has been reviewed and updated.  PHQ 2/9 Scores 09/30/2017 09/30/2017 04/07/2017 03/27/2016  PHQ - 2 Score 0 0 0 0  PHQ- 9 Score 0 - - -    Physical Exam  Constitutional: She is oriented to person, place, and time. She appears well-developed. No distress.  HENT:  Head: Normocephalic and atraumatic.  Cardiovascular: Normal rate, regular rhythm and normal heart sounds.  Pulmonary/Chest: Effort normal and breath sounds normal. No respiratory distress. She has no wheezes.  Abdominal: Soft. Bowel sounds are normal. There is no tenderness.  Musculoskeletal: Normal range of motion. She exhibits no edema.  Neurological: She is alert and oriented to person, place, and time.  Skin: Skin is warm and dry. No rash noted.  Psychiatric: She has a normal mood and affect. Her behavior is normal. Thought content normal.  Nursing note and vitals reviewed.  BP (!) 144/68   Pulse (!) 105   Ht _0  (1.626 m)   Wt 141 lb (64 kg)   SpO2 (!) 85%   BMI 24.20 kg/m   Assessment and Plan: 1. Urinary tract infection without hematuria, site unspecified No antibiotics for now - await culture - POC urinalysis w microscopic (non auto) - Urine Culture  2. Chronic respiratory failure with hypoxia (HCC) Continue O2 and medication  3. Primary lung adenocarcinoma, left ([place]) Now on maintenance chemotherapy   No orders of the defined types were placed in this encounter.   Partially dictated using Editor, commissioning. Any errors are unintentional.  Halina Maidens, MD [facility]  10/23/2017

## 2017-10-27 LAB — URINE CULTURE

## 2017-10-28 DIAGNOSIS — Z8701 Personal history of pneumonia (recurrent): Secondary | ICD-10-CM | POA: Diagnosis not present

## 2017-10-28 DIAGNOSIS — R0902 Hypoxemia: Secondary | ICD-10-CM | POA: Diagnosis not present

## 2017-10-28 DIAGNOSIS — Z8673 Personal history of transient ischemic attack (TIA), and cerebral infarction without residual deficits: Secondary | ICD-10-CM | POA: Diagnosis not present

## 2017-10-28 DIAGNOSIS — R0602 Shortness of breath: Secondary | ICD-10-CM | POA: Diagnosis not present

## 2017-10-28 DIAGNOSIS — E785 Hyperlipidemia, unspecified: Secondary | ICD-10-CM | POA: Diagnosis present

## 2017-10-28 DIAGNOSIS — I10 Essential (primary) hypertension: Secondary | ICD-10-CM | POA: Diagnosis present

## 2017-10-28 DIAGNOSIS — Z9221 Personal history of antineoplastic chemotherapy: Secondary | ICD-10-CM | POA: Diagnosis not present

## 2017-10-28 DIAGNOSIS — Z9981 Dependence on supplemental oxygen: Secondary | ICD-10-CM | POA: Diagnosis not present

## 2017-10-28 DIAGNOSIS — J441 Chronic obstructive pulmonary disease with (acute) exacerbation: Secondary | ICD-10-CM | POA: Diagnosis not present

## 2017-10-28 DIAGNOSIS — E875 Hyperkalemia: Secondary | ICD-10-CM | POA: Diagnosis not present

## 2017-10-28 DIAGNOSIS — R0600 Dyspnea, unspecified: Secondary | ICD-10-CM | POA: Diagnosis not present

## 2017-10-28 DIAGNOSIS — Z7951 Long term (current) use of inhaled steroids: Secondary | ICD-10-CM | POA: Diagnosis not present

## 2017-10-28 DIAGNOSIS — Z87891 Personal history of nicotine dependence: Secondary | ICD-10-CM | POA: Diagnosis not present

## 2017-10-28 DIAGNOSIS — Z7982 Long term (current) use of aspirin: Secondary | ICD-10-CM | POA: Diagnosis not present

## 2017-10-28 DIAGNOSIS — C3490 Malignant neoplasm of unspecified part of unspecified bronchus or lung: Secondary | ICD-10-CM | POA: Diagnosis not present

## 2017-10-28 DIAGNOSIS — C7931 Secondary malignant neoplasm of brain: Secondary | ICD-10-CM | POA: Diagnosis present

## 2017-10-28 DIAGNOSIS — C3412 Malignant neoplasm of upper lobe, left bronchus or lung: Secondary | ICD-10-CM | POA: Diagnosis present

## 2017-10-28 DIAGNOSIS — R918 Other nonspecific abnormal finding of lung field: Secondary | ICD-10-CM | POA: Diagnosis not present

## 2017-10-28 DIAGNOSIS — J9611 Chronic respiratory failure with hypoxia: Secondary | ICD-10-CM | POA: Diagnosis present

## 2017-10-31 MED ORDER — GENERIC EXTERNAL MEDICATION
30.00 | Status: DC
Start: 2017-11-01 — End: 2017-10-31

## 2017-10-31 MED ORDER — BUDESONIDE-FORMOTEROL FUMARATE 160-4.5 MCG/ACT IN AERO
2.00 | INHALATION_SPRAY | RESPIRATORY_TRACT | Status: DC
Start: 2017-10-31 — End: 2017-10-31

## 2017-10-31 MED ORDER — LEVOFLOXACIN 500 MG PO TABS
500.00 | ORAL_TABLET | ORAL | Status: DC
Start: 2017-11-01 — End: 2017-10-31

## 2017-10-31 MED ORDER — ALUM & MAG HYDROXIDE-SIMETH 400-400-40 MG/5ML PO SUSP
30.00 | ORAL | Status: DC
Start: ? — End: 2017-10-31

## 2017-10-31 MED ORDER — ALBUTEROL SULFATE (2.5 MG/3ML) 0.083% IN NEBU
1.25 | INHALATION_SOLUTION | RESPIRATORY_TRACT | Status: DC
Start: 2017-10-31 — End: 2017-10-31

## 2017-10-31 MED ORDER — GUAIFENESIN-DM 100-10 MG/5ML PO SYRP
5.00 | ORAL_SOLUTION | ORAL | Status: DC
Start: ? — End: 2017-10-31

## 2017-10-31 MED ORDER — SIMVASTATIN 20 MG PO TABS
40.00 | ORAL_TABLET | ORAL | Status: DC
Start: 2017-10-31 — End: 2017-10-31

## 2017-10-31 MED ORDER — LOSARTAN POTASSIUM 25 MG PO TABS
100.00 | ORAL_TABLET | ORAL | Status: DC
Start: 2017-11-01 — End: 2017-10-31

## 2017-10-31 MED ORDER — ASPIRIN 81 MG PO CHEW
81.00 | CHEWABLE_TABLET | ORAL | Status: DC
Start: 2017-11-01 — End: 2017-10-31

## 2017-10-31 MED ORDER — MELATONIN 3 MG PO TABS
3.00 | ORAL_TABLET | ORAL | Status: DC
Start: 2017-10-31 — End: 2017-10-31

## 2017-10-31 MED ORDER — ALBUTEROL SULFATE (2.5 MG/3ML) 0.083% IN NEBU
2.50 | INHALATION_SOLUTION | RESPIRATORY_TRACT | Status: DC
Start: ? — End: 2017-10-31

## 2017-10-31 MED ORDER — TRAZODONE HCL 50 MG PO TABS
25.00 | ORAL_TABLET | ORAL | Status: DC
Start: ? — End: 2017-10-31

## 2017-10-31 MED ORDER — ACETAMINOPHEN 500 MG PO TABS
1000.00 | ORAL_TABLET | ORAL | Status: DC
Start: ? — End: 2017-10-31

## 2017-10-31 MED ORDER — ENOXAPARIN SODIUM 40 MG/0.4ML ~~LOC~~ SOLN
40.00 | SUBCUTANEOUS | Status: DC
Start: 2017-10-31 — End: 2017-10-31

## 2017-10-31 MED ORDER — IPRATROPIUM BROMIDE 0.02 % IN SOLN
500.00 | RESPIRATORY_TRACT | Status: DC
Start: 2017-10-31 — End: 2017-10-31

## 2017-10-31 MED ORDER — POLYETHYLENE GLYCOL 3350 17 G PO PACK
17.00 | PACK | ORAL | Status: DC
Start: ? — End: 2017-10-31

## 2017-11-03 DIAGNOSIS — Z9981 Dependence on supplemental oxygen: Secondary | ICD-10-CM | POA: Diagnosis not present

## 2017-11-03 DIAGNOSIS — Z8673 Personal history of transient ischemic attack (TIA), and cerebral infarction without residual deficits: Secondary | ICD-10-CM | POA: Diagnosis not present

## 2017-11-03 DIAGNOSIS — Z87440 Personal history of urinary (tract) infections: Secondary | ICD-10-CM | POA: Diagnosis not present

## 2017-11-03 DIAGNOSIS — J439 Emphysema, unspecified: Secondary | ICD-10-CM | POA: Diagnosis not present

## 2017-11-03 DIAGNOSIS — C7931 Secondary malignant neoplasm of brain: Secondary | ICD-10-CM | POA: Diagnosis not present

## 2017-11-03 DIAGNOSIS — C3412 Malignant neoplasm of upper lobe, left bronchus or lung: Secondary | ICD-10-CM | POA: Diagnosis not present

## 2017-11-03 DIAGNOSIS — J9611 Chronic respiratory failure with hypoxia: Secondary | ICD-10-CM | POA: Diagnosis not present

## 2017-11-03 DIAGNOSIS — E785 Hyperlipidemia, unspecified: Secondary | ICD-10-CM | POA: Diagnosis not present

## 2017-11-03 DIAGNOSIS — Z87891 Personal history of nicotine dependence: Secondary | ICD-10-CM | POA: Diagnosis not present

## 2017-11-03 DIAGNOSIS — I10 Essential (primary) hypertension: Secondary | ICD-10-CM | POA: Diagnosis not present

## 2017-11-03 DIAGNOSIS — Z7982 Long term (current) use of aspirin: Secondary | ICD-10-CM | POA: Diagnosis not present

## 2017-11-04 DIAGNOSIS — I10 Essential (primary) hypertension: Secondary | ICD-10-CM | POA: Diagnosis not present

## 2017-11-04 DIAGNOSIS — J439 Emphysema, unspecified: Secondary | ICD-10-CM | POA: Diagnosis not present

## 2017-11-04 DIAGNOSIS — E785 Hyperlipidemia, unspecified: Secondary | ICD-10-CM | POA: Diagnosis not present

## 2017-11-04 DIAGNOSIS — C7931 Secondary malignant neoplasm of brain: Secondary | ICD-10-CM | POA: Diagnosis not present

## 2017-11-04 DIAGNOSIS — J9611 Chronic respiratory failure with hypoxia: Secondary | ICD-10-CM | POA: Diagnosis not present

## 2017-11-04 DIAGNOSIS — C3412 Malignant neoplasm of upper lobe, left bronchus or lung: Secondary | ICD-10-CM | POA: Diagnosis not present

## 2017-11-06 DIAGNOSIS — J439 Emphysema, unspecified: Secondary | ICD-10-CM | POA: Diagnosis not present

## 2017-11-06 DIAGNOSIS — J9611 Chronic respiratory failure with hypoxia: Secondary | ICD-10-CM | POA: Diagnosis not present

## 2017-11-06 DIAGNOSIS — C3412 Malignant neoplasm of upper lobe, left bronchus or lung: Secondary | ICD-10-CM | POA: Diagnosis not present

## 2017-11-06 DIAGNOSIS — C7931 Secondary malignant neoplasm of brain: Secondary | ICD-10-CM | POA: Diagnosis not present

## 2017-11-06 DIAGNOSIS — E785 Hyperlipidemia, unspecified: Secondary | ICD-10-CM | POA: Diagnosis not present

## 2017-11-06 DIAGNOSIS — I10 Essential (primary) hypertension: Secondary | ICD-10-CM | POA: Diagnosis not present

## 2017-11-09 DIAGNOSIS — J439 Emphysema, unspecified: Secondary | ICD-10-CM | POA: Diagnosis not present

## 2017-11-09 DIAGNOSIS — I10 Essential (primary) hypertension: Secondary | ICD-10-CM | POA: Diagnosis not present

## 2017-11-09 DIAGNOSIS — C7931 Secondary malignant neoplasm of brain: Secondary | ICD-10-CM | POA: Diagnosis not present

## 2017-11-09 DIAGNOSIS — E785 Hyperlipidemia, unspecified: Secondary | ICD-10-CM | POA: Diagnosis not present

## 2017-11-09 DIAGNOSIS — C3412 Malignant neoplasm of upper lobe, left bronchus or lung: Secondary | ICD-10-CM | POA: Diagnosis not present

## 2017-11-09 DIAGNOSIS — J9611 Chronic respiratory failure with hypoxia: Secondary | ICD-10-CM | POA: Diagnosis not present

## 2017-11-11 DIAGNOSIS — C3490 Malignant neoplasm of unspecified part of unspecified bronchus or lung: Secondary | ICD-10-CM | POA: Diagnosis not present

## 2017-11-11 DIAGNOSIS — C3412 Malignant neoplasm of upper lobe, left bronchus or lung: Secondary | ICD-10-CM | POA: Diagnosis not present

## 2017-11-11 DIAGNOSIS — Z5111 Encounter for antineoplastic chemotherapy: Secondary | ICD-10-CM | POA: Diagnosis not present

## 2017-11-12 DIAGNOSIS — J9611 Chronic respiratory failure with hypoxia: Secondary | ICD-10-CM | POA: Diagnosis not present

## 2017-11-12 DIAGNOSIS — J439 Emphysema, unspecified: Secondary | ICD-10-CM | POA: Diagnosis not present

## 2017-11-12 DIAGNOSIS — C3412 Malignant neoplasm of upper lobe, left bronchus or lung: Secondary | ICD-10-CM | POA: Diagnosis not present

## 2017-11-12 DIAGNOSIS — E785 Hyperlipidemia, unspecified: Secondary | ICD-10-CM | POA: Diagnosis not present

## 2017-11-12 DIAGNOSIS — C7931 Secondary malignant neoplasm of brain: Secondary | ICD-10-CM | POA: Diagnosis not present

## 2017-11-12 DIAGNOSIS — I10 Essential (primary) hypertension: Secondary | ICD-10-CM | POA: Diagnosis not present

## 2017-11-16 DIAGNOSIS — C3412 Malignant neoplasm of upper lobe, left bronchus or lung: Secondary | ICD-10-CM | POA: Diagnosis not present

## 2017-11-16 DIAGNOSIS — I10 Essential (primary) hypertension: Secondary | ICD-10-CM | POA: Diagnosis not present

## 2017-11-16 DIAGNOSIS — E785 Hyperlipidemia, unspecified: Secondary | ICD-10-CM | POA: Diagnosis not present

## 2017-11-16 DIAGNOSIS — J439 Emphysema, unspecified: Secondary | ICD-10-CM | POA: Diagnosis not present

## 2017-11-16 DIAGNOSIS — J9611 Chronic respiratory failure with hypoxia: Secondary | ICD-10-CM | POA: Diagnosis not present

## 2017-11-16 DIAGNOSIS — C7931 Secondary malignant neoplasm of brain: Secondary | ICD-10-CM | POA: Diagnosis not present

## 2017-11-19 DIAGNOSIS — J439 Emphysema, unspecified: Secondary | ICD-10-CM | POA: Diagnosis not present

## 2017-11-19 DIAGNOSIS — J9611 Chronic respiratory failure with hypoxia: Secondary | ICD-10-CM | POA: Diagnosis not present

## 2017-11-19 DIAGNOSIS — I10 Essential (primary) hypertension: Secondary | ICD-10-CM | POA: Diagnosis not present

## 2017-11-19 DIAGNOSIS — C3412 Malignant neoplasm of upper lobe, left bronchus or lung: Secondary | ICD-10-CM | POA: Diagnosis not present

## 2017-11-19 DIAGNOSIS — C7931 Secondary malignant neoplasm of brain: Secondary | ICD-10-CM | POA: Diagnosis not present

## 2017-11-19 DIAGNOSIS — E785 Hyperlipidemia, unspecified: Secondary | ICD-10-CM | POA: Diagnosis not present

## 2017-11-23 DIAGNOSIS — C3412 Malignant neoplasm of upper lobe, left bronchus or lung: Secondary | ICD-10-CM | POA: Diagnosis not present

## 2017-11-23 DIAGNOSIS — E785 Hyperlipidemia, unspecified: Secondary | ICD-10-CM | POA: Diagnosis not present

## 2017-11-23 DIAGNOSIS — C7931 Secondary malignant neoplasm of brain: Secondary | ICD-10-CM | POA: Diagnosis not present

## 2017-11-23 DIAGNOSIS — J9611 Chronic respiratory failure with hypoxia: Secondary | ICD-10-CM | POA: Diagnosis not present

## 2017-11-23 DIAGNOSIS — J439 Emphysema, unspecified: Secondary | ICD-10-CM | POA: Diagnosis not present

## 2017-11-23 DIAGNOSIS — I10 Essential (primary) hypertension: Secondary | ICD-10-CM | POA: Diagnosis not present

## 2017-11-26 ENCOUNTER — Other Ambulatory Visit (INDEPENDENT_AMBULATORY_CARE_PROVIDER_SITE_OTHER): Payer: Medicare Other | Admitting: Internal Medicine

## 2017-11-26 DIAGNOSIS — E785 Hyperlipidemia, unspecified: Secondary | ICD-10-CM | POA: Diagnosis not present

## 2017-11-26 DIAGNOSIS — I10 Essential (primary) hypertension: Secondary | ICD-10-CM

## 2017-11-26 DIAGNOSIS — J438 Other emphysema: Secondary | ICD-10-CM

## 2017-11-26 DIAGNOSIS — Z9981 Dependence on supplemental oxygen: Secondary | ICD-10-CM | POA: Insufficient documentation

## 2017-11-26 DIAGNOSIS — E782 Mixed hyperlipidemia: Secondary | ICD-10-CM

## 2017-11-26 DIAGNOSIS — J9611 Chronic respiratory failure with hypoxia: Secondary | ICD-10-CM | POA: Diagnosis not present

## 2017-11-26 DIAGNOSIS — C7931 Secondary malignant neoplasm of brain: Secondary | ICD-10-CM | POA: Diagnosis not present

## 2017-11-26 DIAGNOSIS — J439 Emphysema, unspecified: Secondary | ICD-10-CM | POA: Diagnosis not present

## 2017-11-26 DIAGNOSIS — C3492 Malignant neoplasm of unspecified part of left bronchus or lung: Secondary | ICD-10-CM | POA: Diagnosis not present

## 2017-11-26 DIAGNOSIS — I639 Cerebral infarction, unspecified: Secondary | ICD-10-CM

## 2017-11-26 DIAGNOSIS — C3412 Malignant neoplasm of upper lobe, left bronchus or lung: Secondary | ICD-10-CM | POA: Diagnosis not present

## 2017-11-26 NOTE — Progress Notes (Signed)
Received orders from [facility]. Start of care 11/03/17.  Certification period 11/03/17 through 01/01/18.  Orders are reviewed, signed and faxed.

## 2017-11-30 ENCOUNTER — Encounter: Payer: Self-pay | Admitting: Obstetrics and Gynecology

## 2017-11-30 ENCOUNTER — Ambulatory Visit (INDEPENDENT_AMBULATORY_CARE_PROVIDER_SITE_OTHER): Payer: Medicare Other | Admitting: Obstetrics and Gynecology

## 2017-11-30 VITALS — BP 166/73 | HR 91 | Ht 64.0 in | Wt 139.2 lb

## 2017-11-30 DIAGNOSIS — N8111 Cystocele, midline: Secondary | ICD-10-CM | POA: Diagnosis not present

## 2017-11-30 DIAGNOSIS — N813 Complete uterovaginal prolapse: Secondary | ICD-10-CM | POA: Diagnosis not present

## 2017-11-30 DIAGNOSIS — I10 Essential (primary) hypertension: Secondary | ICD-10-CM | POA: Diagnosis not present

## 2017-11-30 DIAGNOSIS — Z4689 Encounter for fitting and adjustment of other specified devices: Secondary | ICD-10-CM | POA: Diagnosis not present

## 2017-11-30 DIAGNOSIS — J9611 Chronic respiratory failure with hypoxia: Secondary | ICD-10-CM | POA: Diagnosis not present

## 2017-11-30 DIAGNOSIS — C3412 Malignant neoplasm of upper lobe, left bronchus or lung: Secondary | ICD-10-CM | POA: Diagnosis not present

## 2017-11-30 DIAGNOSIS — I639 Cerebral infarction, unspecified: Secondary | ICD-10-CM

## 2017-11-30 DIAGNOSIS — C7931 Secondary malignant neoplasm of brain: Secondary | ICD-10-CM | POA: Diagnosis not present

## 2017-11-30 DIAGNOSIS — J439 Emphysema, unspecified: Secondary | ICD-10-CM | POA: Diagnosis not present

## 2017-11-30 DIAGNOSIS — E785 Hyperlipidemia, unspecified: Secondary | ICD-10-CM | POA: Diagnosis not present

## 2017-11-30 NOTE — Progress Notes (Signed)
Chief complaint: 1.  Pessary maintenance 2.  Uterine procidentia 3.  Recent diagnosis of stage IV lung cancer and history of CVA  5-monthpessary maintenance (last visit 09/02/2017).  At last visit the pessary was not removed. ERenneeis getting monthly chemotherapy for her lung cancer-fairly well-tolerated.  Gel horn pessary Medications:  Trimosan gel intravaginal weekly  Premarin cream intravaginal weekly Patient reports no significant vaginal discharge, vaginal spotting, or vaginal odor.  She denies pelvic pain. Bowel function is reportedly normal. Bladder function is reportedly normal.  Past medical history, past surgical history, problem list, medications, and allergies are reviewed  OBJECTIVE: BP (!) 166/73   Pulse 91   Ht 5' 4"  (1.626 m)   Wt 139 lb 3.2 oz (63.1 kg)   BMI 23.89 kg/m  Pleasant elderly female in no acute distress. Abdomen: Soft, nontender Pelvic exam: Surgeon today-normal BUS-normal Vagina-posterior vaginal wall abrasion; minimal bloody secretions; no significant malodor; cystocele, midline Cervix-no lesions Rectovaginal-normal external exam  PROCEDURE: The gel horn pessary was removed, cleaned, and not reinserted  ASSESSMENT: 1.  Uterine procidentia, stable with pessary use 2.  Cystocele, stable with pessary use 3.  Vaginal abrasion, minimally symptomatic  PLAN: 1.  Leave pessary out for 2 weeks 2.  Continue using Premarin cream intravaginal once a week 3.  Hold off on using Trimosan gel until pessary is reinserted 4.  Return in 2 weeks for pessary insertion  A total of 15 minutes were spent face-to-face with the patient during this encounter and over half of that time dealt with counseling and coordination of care.  MBrayton Mars MD  Note: This dictation was prepared with Dragon dictation along with smaller phrase technology. Any transcriptional errors that result from this process are unintentional.

## 2017-11-30 NOTE — Patient Instructions (Signed)
1.  Pessary is removed today.  It is not reinserted because of vaginal abrasion. 2.  Pessary will be left out for 2 weeks. 3.  Return in 2 weeks for reinsertion of pessary 4.  Hold off on using Trimosan gel until pessary is reinserted. 5.  Continue using the Premarin cream intravaginal weekly

## 2017-12-07 ENCOUNTER — Other Ambulatory Visit: Payer: Self-pay | Admitting: Family Medicine

## 2017-12-07 DIAGNOSIS — I639 Cerebral infarction, unspecified: Secondary | ICD-10-CM

## 2017-12-08 ENCOUNTER — Other Ambulatory Visit: Payer: Self-pay

## 2017-12-08 ENCOUNTER — Other Ambulatory Visit: Payer: Self-pay | Admitting: Internal Medicine

## 2017-12-08 DIAGNOSIS — I639 Cerebral infarction, unspecified: Secondary | ICD-10-CM

## 2017-12-09 DIAGNOSIS — C3490 Malignant neoplasm of unspecified part of unspecified bronchus or lung: Secondary | ICD-10-CM | POA: Diagnosis not present

## 2017-12-09 DIAGNOSIS — Z5111 Encounter for antineoplastic chemotherapy: Secondary | ICD-10-CM | POA: Diagnosis not present

## 2017-12-09 DIAGNOSIS — R911 Solitary pulmonary nodule: Secondary | ICD-10-CM | POA: Diagnosis not present

## 2017-12-09 DIAGNOSIS — J90 Pleural effusion, not elsewhere classified: Secondary | ICD-10-CM | POA: Diagnosis not present

## 2017-12-09 DIAGNOSIS — C3412 Malignant neoplasm of upper lobe, left bronchus or lung: Secondary | ICD-10-CM | POA: Diagnosis not present

## 2017-12-09 DIAGNOSIS — R918 Other nonspecific abnormal finding of lung field: Secondary | ICD-10-CM | POA: Diagnosis not present

## 2017-12-15 ENCOUNTER — Ambulatory Visit (INDEPENDENT_AMBULATORY_CARE_PROVIDER_SITE_OTHER): Payer: Medicare Other | Admitting: Obstetrics and Gynecology

## 2017-12-15 ENCOUNTER — Encounter: Payer: Self-pay | Admitting: Obstetrics and Gynecology

## 2017-12-15 VITALS — BP 130/65 | HR 94 | Ht 64.0 in | Wt 137.4 lb

## 2017-12-15 DIAGNOSIS — S30814D Abrasion of vagina and vulva, subsequent encounter: Secondary | ICD-10-CM | POA: Diagnosis not present

## 2017-12-15 DIAGNOSIS — N814 Uterovaginal prolapse, unspecified: Secondary | ICD-10-CM

## 2017-12-15 DIAGNOSIS — I639 Cerebral infarction, unspecified: Secondary | ICD-10-CM

## 2017-12-15 NOTE — Progress Notes (Signed)
Chief complaint: 1.  Follow-up on vaginal ulceration 2.  Uterine procidentia 3.  Stage IV lung cancer and history of CVA  Patient presents for 2-week follow-up.  At last visit the gel horn pessary was removed because of vaginal ulceration.  Over the past 2 weeks she has been using Premarin cream intravaginal weekly.  She has not noted any vaginal discharge, vaginal bleeding, vaginal odor, or vaginal pain.  She does report being more frequently with the pessary out.  She has been avoiding doing any heavy lifting since the pessary has been out.  Chrishauna is getting monthly chemotherapy for lung cancer; major side effects include fatigue following periodic treatments.  Gel horn pessary Medications:  Trimosan gel intravaginal weekly  Premarin cream intravaginal weekly  Past medical history, past surgical history, problem list, medications, and allergies are reviewed  OBJECTIVE: BP 130/65   Pulse 94   Ht 5' 4"  (1.626 m)   Wt 137 lb 6.4 oz (62.3 kg)   BMI 23.58 kg/m  Pleasant female in no acute distress.  Alert and oriented.  Using nasal cannula oxygen without evidence of respiratory distress or tachypnea Abdomen: Soft, nontender; no organomegaly Pelvic External genitalia-normal BUS-normal Vagina-healthy in appearance without evidence of discharge; ulceration is healed Cervix-no lesions Uterus-not examined Adnexa-not examined Rectovaginal-normal external exam  PROCEDURE: #1 pessary is inserted  ASSESSMENT: 1.  Vaginal abrasion, resolved 2.  Uterine procidentia, stable with pessary use 3.  Cystocele, stable with pessary use  PLAN: 1.  [place] pessary is inserted today 2.  Continue using Premarin cream intravaginal weekly 3.  Continue using Trimosan gel intravaginal weekly 4.  Return in 12 weeks for pessary maintenance  A total of 15 minutes were spent face-to-face with the patient during this encounter and over half of that time dealt with counseling and coordination of  care.  Brayton Mars, MD  Note: This dictation was prepared with Dragon dictation along with smaller phrase technology. Any transcriptional errors that result from this process are unintentional.

## 2017-12-15 NOTE — Patient Instructions (Signed)
1.  Pessary is inserted today. 2.  Continue using Trimosan gel intravaginal and Premarin cream intravaginal weekly 3.  Return in 12 weeks for pessary maintenance

## 2017-12-20 IMAGING — US US EXTREM LOW VENOUS*L*
1 series · 13 of 24 positions shown · non-contrast
Comparison: None.

CLINICAL DATA: Left lower extremity pain.



[Series 1: us extrem low venous*left* · 0.07mm/px · 13 of 34 slices shown]
[im 1/34]
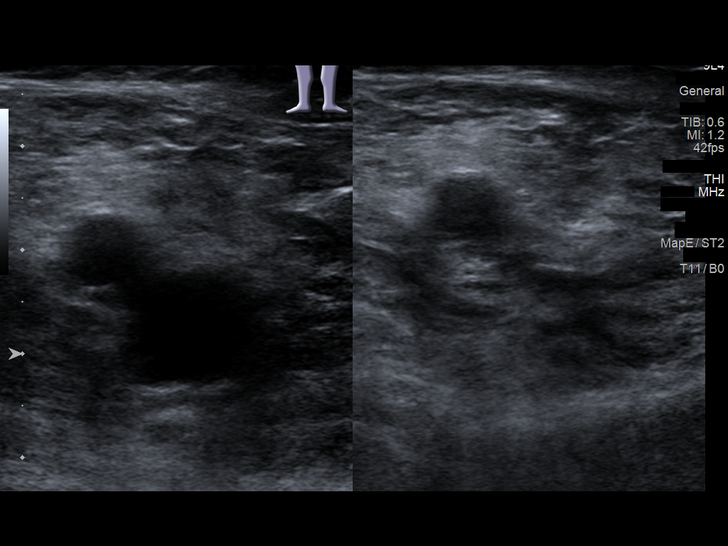
[im 3/34]
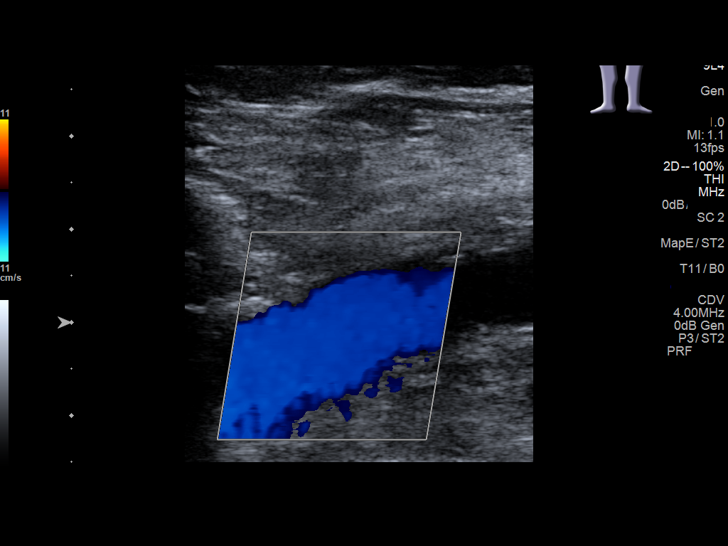
[im 6/34]
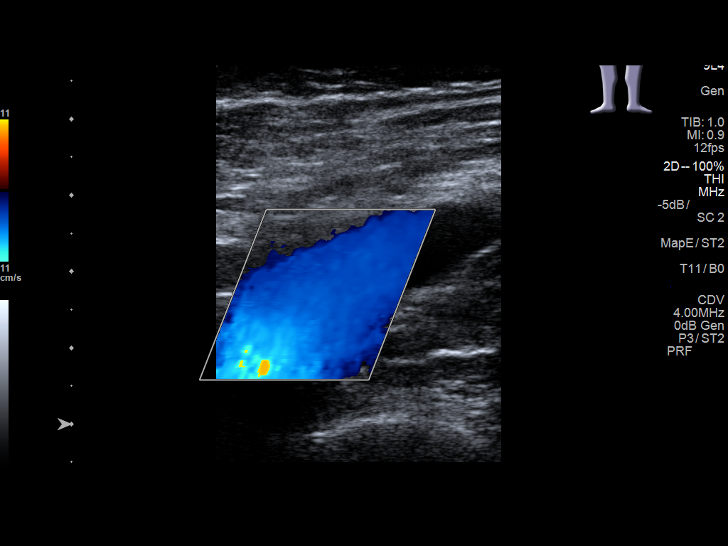
[im 9/34]
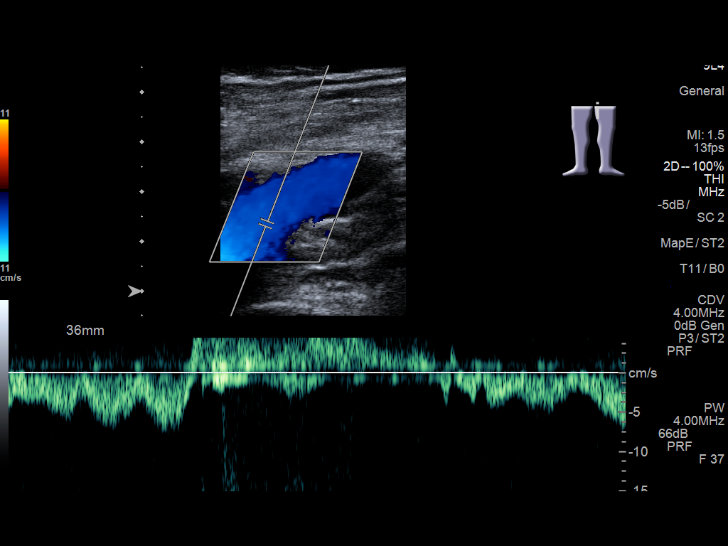
[im 12/34]
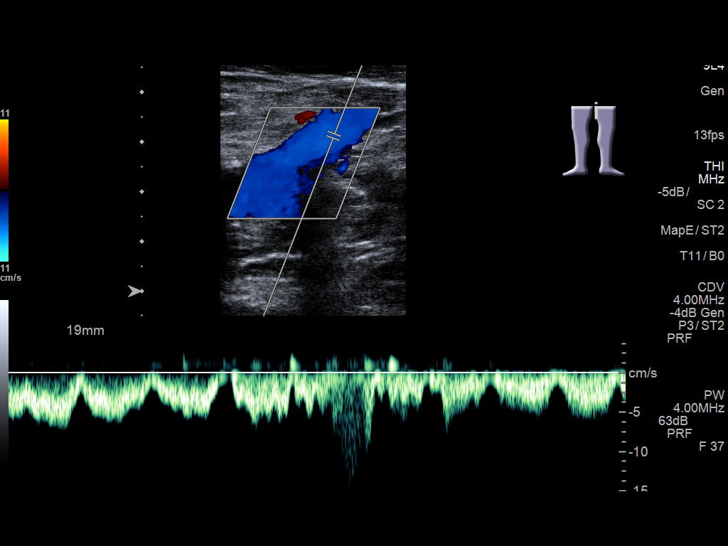
[im 15/34]
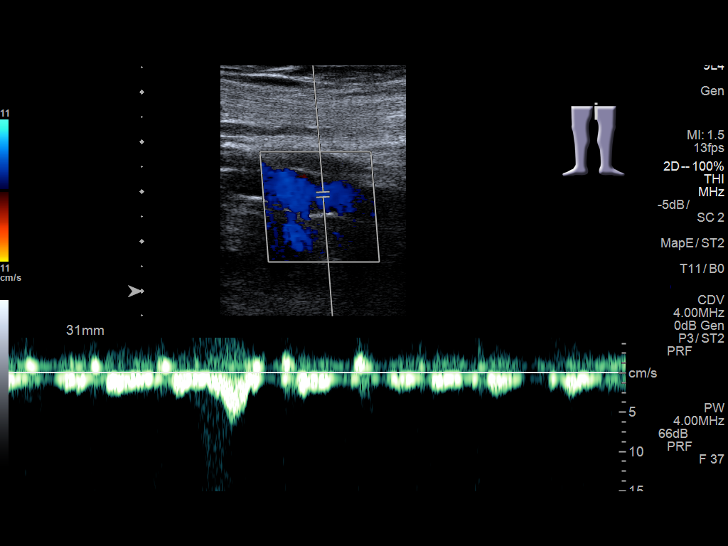
[im 18/34]
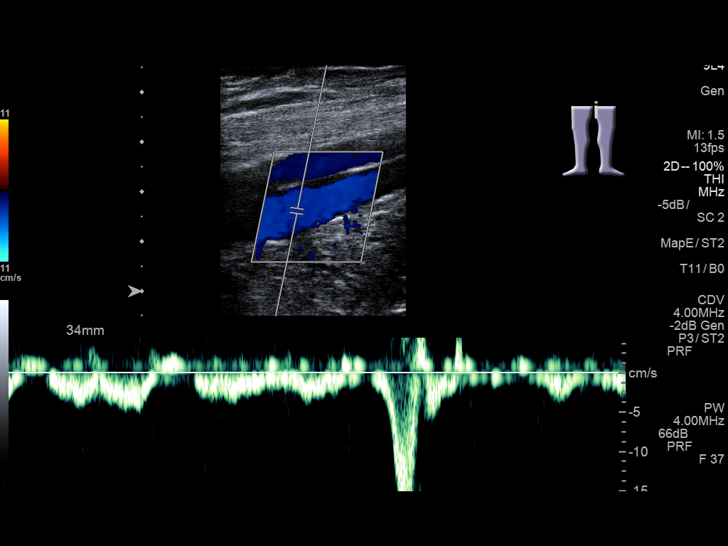
[im 19/34]
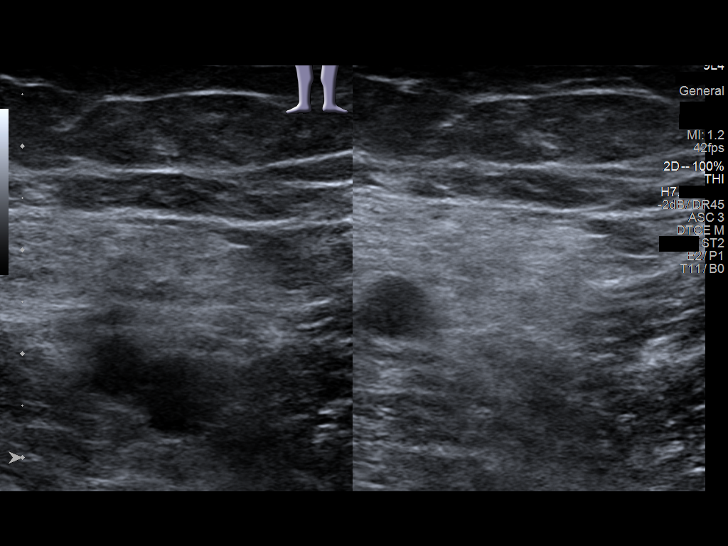
[im 22/34]
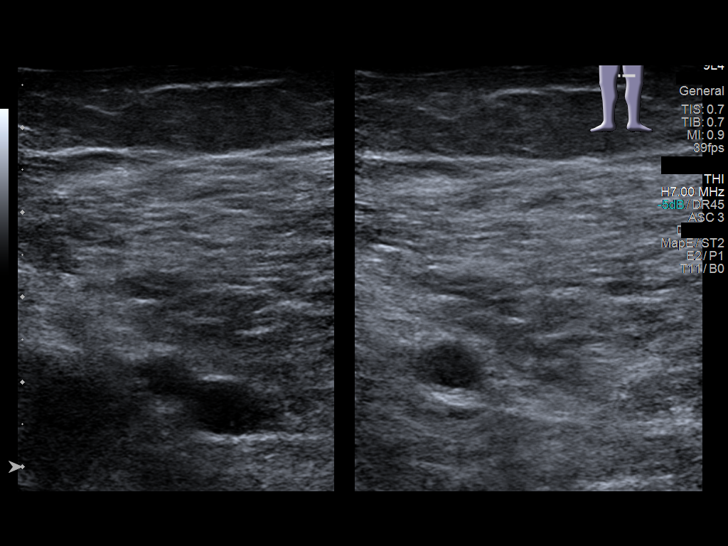
[im 25/34]
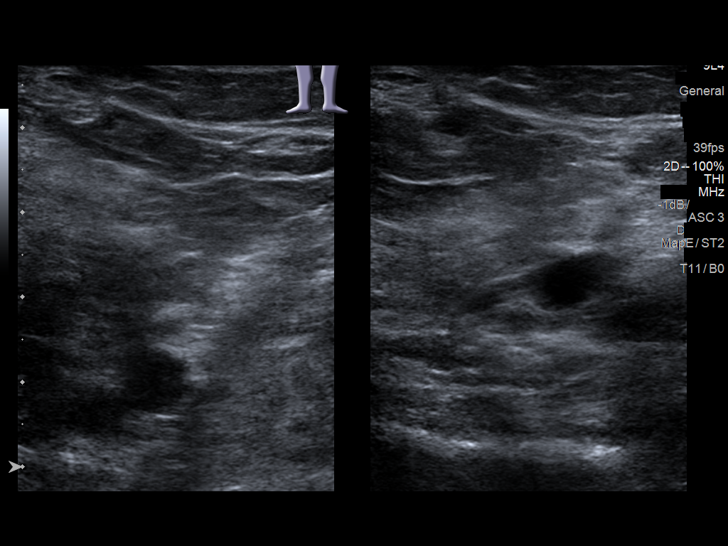
[im 28/34]
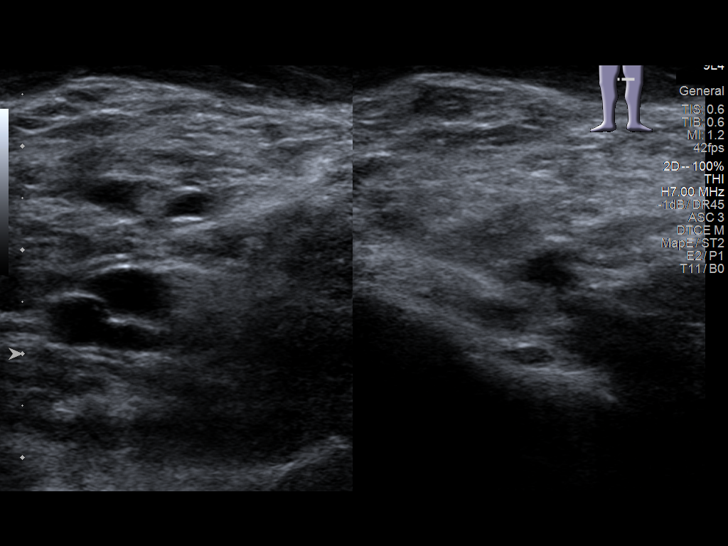
[im 31/34]
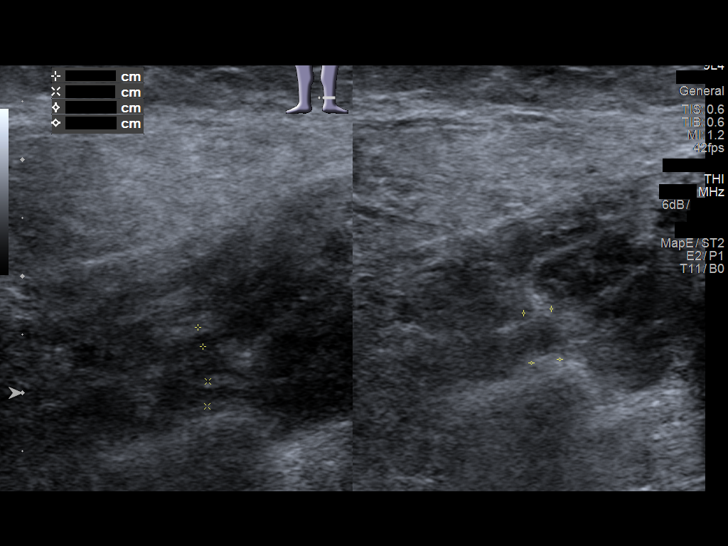
[im 34/34]
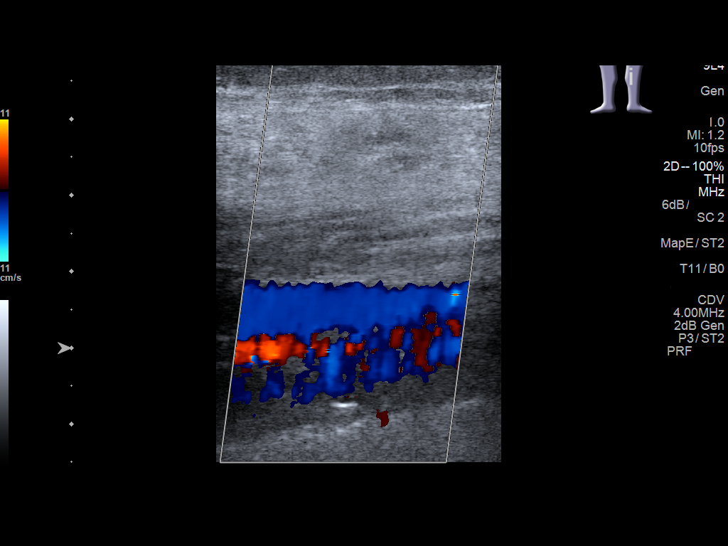

[13 of 24 positions shown; findings below may reference images not displayed]

FINDINGS: Contralateral Common Femoral Vein: Respiratory phasicity is normal
and symmetric with the symptomatic side. No evidence of thrombus.
Normal compressibility.

Common Femoral Vein: No evidence of thrombus. Normal
compressibility, respiratory phasicity and response to augmentation.

Saphenofemoral Junction: No evidence of thrombus. Normal
compressibility and flow on color Doppler imaging.

Profunda Femoral Vein: No evidence of thrombus. Normal
compressibility and flow on color Doppler imaging.

Femoral Vein: No evidence of thrombus. Normal compressibility,
respiratory phasicity and response to augmentation.

Popliteal Vein: No evidence of thrombus. Normal compressibility,
respiratory phasicity and response to augmentation.

Calf Veins: No evidence of thrombus. Normal compressibility and flow
on color Doppler imaging.

Superficial Great Saphenous Vein: No evidence of thrombus. Normal
compressibility and flow on color Doppler imaging.

Venous Reflux:  None.

Other Findings:  None.
IMPRESSION: No evidence of DVT within the left lower extremity.

## 2018-01-06 DIAGNOSIS — H1089 Other conjunctivitis: Secondary | ICD-10-CM | POA: Diagnosis not present

## 2018-01-06 DIAGNOSIS — Z5111 Encounter for antineoplastic chemotherapy: Secondary | ICD-10-CM | POA: Diagnosis not present

## 2018-01-06 DIAGNOSIS — C3412 Malignant neoplasm of upper lobe, left bronchus or lung: Secondary | ICD-10-CM | POA: Diagnosis not present

## 2018-01-06 DIAGNOSIS — C7931 Secondary malignant neoplasm of brain: Secondary | ICD-10-CM | POA: Diagnosis not present

## 2018-01-06 DIAGNOSIS — J439 Emphysema, unspecified: Secondary | ICD-10-CM | POA: Diagnosis not present

## 2018-01-06 DIAGNOSIS — E785 Hyperlipidemia, unspecified: Secondary | ICD-10-CM | POA: Diagnosis not present

## 2018-01-06 DIAGNOSIS — R109 Unspecified abdominal pain: Secondary | ICD-10-CM | POA: Diagnosis not present

## 2018-01-06 DIAGNOSIS — I10 Essential (primary) hypertension: Secondary | ICD-10-CM | POA: Diagnosis not present

## 2018-01-18 DIAGNOSIS — R0602 Shortness of breath: Secondary | ICD-10-CM | POA: Diagnosis not present

## 2018-01-18 DIAGNOSIS — J449 Chronic obstructive pulmonary disease, unspecified: Secondary | ICD-10-CM | POA: Diagnosis not present

## 2018-01-18 DIAGNOSIS — J9621 Acute and chronic respiratory failure with hypoxia: Secondary | ICD-10-CM | POA: Diagnosis not present

## 2018-01-18 DIAGNOSIS — I4949 Other premature depolarization: Secondary | ICD-10-CM | POA: Diagnosis not present

## 2018-01-18 DIAGNOSIS — I10 Essential (primary) hypertension: Secondary | ICD-10-CM | POA: Diagnosis not present

## 2018-01-18 DIAGNOSIS — C7931 Secondary malignant neoplasm of brain: Secondary | ICD-10-CM | POA: Diagnosis not present

## 2018-01-18 DIAGNOSIS — Z87891 Personal history of nicotine dependence: Secondary | ICD-10-CM | POA: Diagnosis not present

## 2018-01-18 DIAGNOSIS — R918 Other nonspecific abnormal finding of lung field: Secondary | ICD-10-CM | POA: Diagnosis not present

## 2018-01-18 DIAGNOSIS — R0600 Dyspnea, unspecified: Secondary | ICD-10-CM | POA: Diagnosis not present

## 2018-01-18 DIAGNOSIS — C3490 Malignant neoplasm of unspecified part of unspecified bronchus or lung: Secondary | ICD-10-CM | POA: Diagnosis not present

## 2018-01-18 DIAGNOSIS — J441 Chronic obstructive pulmonary disease with (acute) exacerbation: Secondary | ICD-10-CM | POA: Diagnosis not present

## 2018-01-18 DIAGNOSIS — N390 Urinary tract infection, site not specified: Secondary | ICD-10-CM | POA: Diagnosis not present

## 2018-01-18 DIAGNOSIS — E875 Hyperkalemia: Secondary | ICD-10-CM | POA: Diagnosis not present

## 2018-01-18 DIAGNOSIS — D649 Anemia, unspecified: Secondary | ICD-10-CM | POA: Diagnosis not present

## 2018-01-18 DIAGNOSIS — C3412 Malignant neoplasm of upper lobe, left bronchus or lung: Secondary | ICD-10-CM | POA: Diagnosis not present

## 2018-01-19 DIAGNOSIS — C7931 Secondary malignant neoplasm of brain: Secondary | ICD-10-CM | POA: Diagnosis present

## 2018-01-19 DIAGNOSIS — N390 Urinary tract infection, site not specified: Secondary | ICD-10-CM | POA: Diagnosis present

## 2018-01-19 DIAGNOSIS — J9621 Acute and chronic respiratory failure with hypoxia: Secondary | ICD-10-CM | POA: Diagnosis present

## 2018-01-19 DIAGNOSIS — R0602 Shortness of breath: Secondary | ICD-10-CM | POA: Diagnosis not present

## 2018-01-19 DIAGNOSIS — Z923 Personal history of irradiation: Secondary | ICD-10-CM | POA: Diagnosis not present

## 2018-01-19 DIAGNOSIS — E875 Hyperkalemia: Secondary | ICD-10-CM | POA: Diagnosis present

## 2018-01-19 DIAGNOSIS — Z8673 Personal history of transient ischemic attack (TIA), and cerebral infarction without residual deficits: Secondary | ICD-10-CM | POA: Diagnosis not present

## 2018-01-19 DIAGNOSIS — B9689 Other specified bacterial agents as the cause of diseases classified elsewhere: Secondary | ICD-10-CM | POA: Diagnosis present

## 2018-01-19 DIAGNOSIS — Z9221 Personal history of antineoplastic chemotherapy: Secondary | ICD-10-CM | POA: Diagnosis not present

## 2018-01-19 DIAGNOSIS — E785 Hyperlipidemia, unspecified: Secondary | ICD-10-CM | POA: Diagnosis present

## 2018-01-19 DIAGNOSIS — Z7951 Long term (current) use of inhaled steroids: Secondary | ICD-10-CM | POA: Diagnosis not present

## 2018-01-19 DIAGNOSIS — R0600 Dyspnea, unspecified: Secondary | ICD-10-CM | POA: Diagnosis not present

## 2018-01-19 DIAGNOSIS — C3412 Malignant neoplasm of upper lobe, left bronchus or lung: Secondary | ICD-10-CM | POA: Diagnosis present

## 2018-01-19 DIAGNOSIS — I10 Essential (primary) hypertension: Secondary | ICD-10-CM | POA: Diagnosis present

## 2018-01-19 DIAGNOSIS — J441 Chronic obstructive pulmonary disease with (acute) exacerbation: Secondary | ICD-10-CM | POA: Diagnosis present

## 2018-01-19 DIAGNOSIS — Z7982 Long term (current) use of aspirin: Secondary | ICD-10-CM | POA: Diagnosis not present

## 2018-01-19 DIAGNOSIS — Z87891 Personal history of nicotine dependence: Secondary | ICD-10-CM | POA: Diagnosis not present

## 2018-01-19 DIAGNOSIS — D649 Anemia, unspecified: Secondary | ICD-10-CM | POA: Diagnosis present

## 2018-01-22 MED ORDER — PREDNISONE 20 MG PO TABS
40.00 | ORAL_TABLET | ORAL | Status: DC
Start: 2018-01-23 — End: 2018-01-22

## 2018-01-22 MED ORDER — MAGNESIUM OXIDE 400 MG PO TABS
400.00 | ORAL_TABLET | ORAL | Status: DC
Start: 2018-01-23 — End: 2018-01-22

## 2018-01-22 MED ORDER — LORATADINE 10 MG PO TABS
10.00 | ORAL_TABLET | ORAL | Status: DC
Start: 2018-01-23 — End: 2018-01-22

## 2018-01-22 MED ORDER — BUDESONIDE-FORMOTEROL FUMARATE 160-4.5 MCG/ACT IN AERO
2.00 | INHALATION_SPRAY | RESPIRATORY_TRACT | Status: DC
Start: 2018-01-22 — End: 2018-01-22

## 2018-01-22 MED ORDER — GENERIC EXTERNAL MEDICATION
1.00 | Status: DC
Start: ? — End: 2018-01-22

## 2018-01-22 MED ORDER — ASPIRIN 81 MG PO CHEW
81.00 | CHEWABLE_TABLET | ORAL | Status: DC
Start: 2018-01-23 — End: 2018-01-22

## 2018-01-22 MED ORDER — PRAVASTATIN SODIUM 40 MG PO TABS
80.00 | ORAL_TABLET | ORAL | Status: DC
Start: 2018-01-22 — End: 2018-01-22

## 2018-01-22 MED ORDER — FLUTICASONE PROPIONATE 50 MCG/ACT NA SUSP
2.00 | NASAL | Status: DC
Start: 2018-01-23 — End: 2018-01-22

## 2018-01-22 MED ORDER — GUAIFENESIN-DM 100-10 MG/5ML PO SYRP
5.00 | ORAL_SOLUTION | ORAL | Status: DC
Start: ? — End: 2018-01-22

## 2018-01-22 MED ORDER — POLYETHYLENE GLYCOL 3350 17 G PO PACK
17.00 | PACK | ORAL | Status: DC
Start: 2018-01-23 — End: 2018-01-22

## 2018-01-22 MED ORDER — FAMOTIDINE 20 MG PO TABS
20.00 | ORAL_TABLET | ORAL | Status: DC
Start: 2018-01-22 — End: 2018-01-22

## 2018-01-22 MED ORDER — POLYETHYLENE GLYCOL 3350 17 G PO PACK
17.00 | PACK | ORAL | Status: DC
Start: ? — End: 2018-01-22

## 2018-01-22 MED ORDER — ALBUTEROL SULFATE (2.5 MG/3ML) 0.083% IN NEBU
2.50 | INHALATION_SOLUTION | RESPIRATORY_TRACT | Status: DC
Start: ? — End: 2018-01-22

## 2018-01-22 MED ORDER — ALBUTEROL SULFATE (2.5 MG/3ML) 0.083% IN NEBU
2.50 | INHALATION_SOLUTION | RESPIRATORY_TRACT | Status: DC
Start: 2018-01-22 — End: 2018-01-22

## 2018-01-22 MED ORDER — IPRATROPIUM BROMIDE 0.02 % IN SOLN
500.00 | RESPIRATORY_TRACT | Status: DC
Start: ? — End: 2018-01-22

## 2018-01-22 MED ORDER — TRAZODONE HCL 50 MG PO TABS
25.00 | ORAL_TABLET | ORAL | Status: DC
Start: ? — End: 2018-01-22

## 2018-01-22 MED ORDER — LOSARTAN POTASSIUM 100 MG PO TABS
100.00 | ORAL_TABLET | ORAL | Status: DC
Start: 2018-01-23 — End: 2018-01-22

## 2018-01-22 MED ORDER — MELATONIN 3 MG PO TABS
3.00 | ORAL_TABLET | ORAL | Status: DC
Start: 2018-01-22 — End: 2018-01-22

## 2018-01-22 MED ORDER — CIPROFLOXACIN HCL 250 MG PO TABS
250.00 | ORAL_TABLET | ORAL | Status: DC
Start: 2018-01-22 — End: 2018-01-22

## 2018-01-22 MED ORDER — ACETAMINOPHEN 325 MG PO TABS
650.00 | ORAL_TABLET | ORAL | Status: DC
Start: ? — End: 2018-01-22

## 2018-01-22 MED ORDER — ENOXAPARIN SODIUM 40 MG/0.4ML ~~LOC~~ SOLN
40.00 | SUBCUTANEOUS | Status: DC
Start: 2018-01-23 — End: 2018-01-22

## 2018-01-22 MED ORDER — IPRATROPIUM BROMIDE 0.02 % IN SOLN
500.00 | RESPIRATORY_TRACT | Status: DC
Start: 2018-01-22 — End: 2018-01-22

## 2018-01-22 MED ORDER — DOXYCYCLINE HYCLATE 100 MG PO TABS
100.00 | ORAL_TABLET | ORAL | Status: DC
Start: 2018-01-22 — End: 2018-01-22

## 2018-01-25 ENCOUNTER — Telehealth: Payer: Self-pay

## 2018-01-25 NOTE — Telephone Encounter (Signed)
TOC #1. Called pt to f/u after d/c from [facility] on 01/22/18. Also wanted to confirm her hosp f/u appt w/ Dr. Army Melia on 02/02/18 @ 8:30am. Discharge planning includes the following:  - Begin Spiriva, Prednisone and Vibramycin - Continue Losartan, ASA and Pravastatin - D/C Ativan LVM requesting returned call.

## 2018-01-27 DIAGNOSIS — C7949 Secondary malignant neoplasm of other parts of nervous system: Secondary | ICD-10-CM | POA: Diagnosis not present

## 2018-01-27 DIAGNOSIS — J439 Emphysema, unspecified: Secondary | ICD-10-CM | POA: Diagnosis not present

## 2018-01-27 DIAGNOSIS — C3412 Malignant neoplasm of upper lobe, left bronchus or lung: Secondary | ICD-10-CM | POA: Diagnosis not present

## 2018-01-27 DIAGNOSIS — C7931 Secondary malignant neoplasm of brain: Secondary | ICD-10-CM | POA: Diagnosis not present

## 2018-01-27 DIAGNOSIS — C7802 Secondary malignant neoplasm of left lung: Secondary | ICD-10-CM | POA: Diagnosis not present

## 2018-01-27 DIAGNOSIS — C761 Malignant neoplasm of thorax: Secondary | ICD-10-CM | POA: Diagnosis not present

## 2018-01-27 DIAGNOSIS — C7839 Secondary malignant neoplasm of other respiratory organs: Secondary | ICD-10-CM | POA: Diagnosis not present

## 2018-01-27 DIAGNOSIS — C3490 Malignant neoplasm of unspecified part of unspecified bronchus or lung: Secondary | ICD-10-CM | POA: Diagnosis not present

## 2018-01-27 DIAGNOSIS — J441 Chronic obstructive pulmonary disease with (acute) exacerbation: Secondary | ICD-10-CM | POA: Diagnosis not present

## 2018-01-27 DIAGNOSIS — R5383 Other fatigue: Secondary | ICD-10-CM | POA: Diagnosis not present

## 2018-02-01 ENCOUNTER — Other Ambulatory Visit: Payer: Self-pay | Admitting: Internal Medicine

## 2018-02-01 ENCOUNTER — Telehealth: Payer: Self-pay

## 2018-02-01 NOTE — Telephone Encounter (Signed)
LATE ENTRY FOR 01/26/18  Transition Care Management Follow-up Telephone Call  How have you been since you were released from the hospital? States cough, dyspnea and wheezing are improving. Denies fever. Still continues use of home O2. Denies any adverse reactions to ATB's.  Do you understand why you were in the hospital? yes  Do you have a copy of your discharge instructions Yes Do you understand the discharge instructions? yes  Where were you discharged to? Home  Do you have support at home? Yes    Items Reviewed:  Medications obtained Yes  Medications reviewed: Yes  Dietary changes reviewed: yes  Home Health? No  DME ordered at discharge obtained? None  Medical supplies: None    Functional Questionnaire:   Activities of Daily Living (ADLs):   She states they are independent in the following: ambulation, bathing and hygiene, feeding, continence, grooming, toileting, dressing and medication management States they require assistance with the following: None  Any transportation issues/concerns?: no  Any patient concerns? no  Confirmed importance and date/time of follow-up visits scheduled with PCP: yes  Confirm appointment scheduled with specialist? Yes  Confirmed with patient if condition begins to worsen call PCP or If it's emergency go to the ER.

## 2018-02-02 ENCOUNTER — Ambulatory Visit (INDEPENDENT_AMBULATORY_CARE_PROVIDER_SITE_OTHER): Payer: Medicare Other | Admitting: Internal Medicine

## 2018-02-02 ENCOUNTER — Encounter: Payer: Self-pay | Admitting: Internal Medicine

## 2018-02-02 VITALS — BP 134/68 | HR 83 | Temp 97.6°F | Ht 64.0 in | Wt 134.0 lb

## 2018-02-02 DIAGNOSIS — C3490 Malignant neoplasm of unspecified part of unspecified bronchus or lung: Secondary | ICD-10-CM | POA: Diagnosis not present

## 2018-02-02 DIAGNOSIS — I10 Essential (primary) hypertension: Secondary | ICD-10-CM | POA: Diagnosis not present

## 2018-02-02 DIAGNOSIS — J438 Other emphysema: Secondary | ICD-10-CM | POA: Diagnosis not present

## 2018-02-02 DIAGNOSIS — D638 Anemia in other chronic diseases classified elsewhere: Secondary | ICD-10-CM | POA: Diagnosis not present

## 2018-02-02 NOTE — Progress Notes (Signed)
Date:  02/02/2018   Name:  Paige Hart   DOB:  1935/06/11   MRN:  [id number]   Chief Complaint: Follow-up (hosp f/u  [facility] COPD 01/18/18---coughing a little and breathing a little better.) Patient admitted to [facility] 01/18/18 to 01/22/18 for COPD and anemia.  She received TOC call on 01/26/18. She was treated with prednisone taper, doxycycline and albuterol.  She also received 3 days of Cipro for UTI sx - cx showed mixed flora.  Spiriva was added to her regimen. She feels like she is breathing more easily.  Able to do some yard work and shopping.  She is wearing O2 around the clock. She was mildly anemic but remained stable over hospital stay. Lung cancer with brain mets - recent scan showed that brain lesion in medial left temporal lobe was unchanged at 0.5 cm.  She is going to take a break from her maintenance infusion.  She will still keep her appointment for B12 injection and labs. HTN - controlled on current medication.  No chest pain, LE edema, headaches.  Review of Systems  Constitutional: Positive for fatigue. Negative for chills, fever and unexpected weight change.  Respiratory: Positive for shortness of breath. Negative for cough, chest tightness and wheezing.   Cardiovascular: Negative for chest pain, palpitations and leg swelling.  Gastrointestinal: Negative for blood in stool, constipation and diarrhea.  Skin: Negative for color change and rash.  Neurological: Negative for dizziness, tremors, weakness and headaches.  Psychiatric/Behavioral: Negative for dysphoric mood and sleep disturbance.    Patient Active Problem List   Diagnosis Date Noted  . Dependence on supplemental oxygen 11/26/2017  . Chronic respiratory failure with hypoxia ([place]) 10/23/2017  . Esophageal dysphagia 04/07/2017  . Adjustment insomnia 03/16/2017  . Primary lung adenocarcinoma, left ([place]) 03/11/2017  . Nodule of right lung 03/11/2017  . Stroke (cerebrum) ([place]) 02/27/2017  . Acute CVA (cerebrovascular  accident) ([place]) 02/26/2017  . Goals of care, counseling/discussion 02/19/2017  . SOB (shortness of breath) 02/07/2017  . Ulcerative colitis ([place]) 09/02/2016  . Blood in stool   . Rectal polyp   . Polyp of sigmoid colon   . Benign neoplasm of descending colon   . Benign neoplasm of transverse colon   . Benign neoplasm of cecum   . BRBPR (bright red blood per rectum) 08/05/2016  . Chronic idiopathic constipation 07/09/2016  . Acute cystitis with hematuria 07/09/2016  . Bleeding hemorrhoid 07/03/2016  . Cystocele, midline 12/27/2015  . Uterine procidentia 12/27/2015  . Pessary maintenance 03/22/2015  . Allergic conjunctivitis 11/17/2014  . Allergic rhinitis 11/17/2014  . Essential (primary) hypertension 11/17/2014  . Graves disease 11/17/2014  . Bursitis of hip 11/17/2014  . Combined fat and carbohydrate induced hyperlipemia 11/17/2014  . Chronic obstructive pulmonary emphysema ([place]) 11/17/2014  . Pelvic relaxation due to uterovaginal prolapse - pessary use 11/17/2014    Prior to Admission medications   Medication Sig Start Date End Date Taking? Authorizing Provider  acetaminophen (TYLENOL) 500 MG tablet Take 1,000 mg by mouth every 6 (six) hours as needed (for pain.).   Yes [provider]  albuterol (ACCUNEB) 1.25 MG/3ML nebulizer solution Take 3 mLs (1.25 mg total) by nebulization every 6 (six) hours as needed for wheezing. 03/16/17  Yes Glean Hess, MD  albuterol (PROVENTIL) (2.5 MG/3ML) 0.083% nebulizer solution 2.5 mg.   Yes [provider]  aspirin EC 81 MG tablet Take 1 tablet (81 mg total) by mouth daily. 02/27/17 02/27/18 Yes Vaughan Basta, MD  dexamethasone (  DECADRON) 4 MG tablet Take 4 mg by mouth. 05/01/17  Yes [provider]  docusate sodium (COLACE) 100 MG capsule Take 100 mg by mouth.   Yes [provider]  estradiol (ESTRACE VAGINAL) 0.1 MG/GM vaginal cream INSERT 0.5GRAMS VAGINALLY ONCE WEEKLY 09/02/17  Yes Defrancesco,  Alanda Slim, MD  ibuprofen (ADVIL,MOTRIN) 400 MG tablet Take 400 mg by mouth.   Yes [provider]  LORazepam (ATIVAN) 1 MG tablet  06/10/17  Yes [provider]  losartan (COZAAR) 100 MG tablet TAKE ONE TABLET BY MOUTH ONCE DAILY 07/24/17  Yes Glean Hess, MD  MULTIPLE VITAMINS-MINERALS PO Take 1 tablet by mouth daily.   Yes [provider]  Oxyquinoline-Sod Lauryl Sulf (TRIMO-SAN) 0.025-0.01 % GEL Place 0.025 mLs vaginally once a week. 09/02/17  Yes Defrancesco, Alanda Slim, MD  PROAIR HFA [id number] Base) MCG/ACT inhaler INHALE TWO PUFFS BY MOUTH EVERY 4 HOURS AS NEEDED FOR WHEEZING AND FOR SHORTNESS OF BREATH 04/20/17  Yes Glean Hess, MD  ranitidine (ZANTAC) 150 MG tablet Take 1 tablet (150 mg total) by mouth 2 (two) times daily. 04/07/17  Yes Glean Hess, MD  simvastatin (ZOCOR) 40 MG tablet TAKE 1 TABLET BY MOUTH ONCE DAILY AT  6PM 12/08/17  Yes Glean Hess, MD  SYMBICORT 160-4.5 MCG/ACT inhaler INHALE 1 PUFF BY MOUTH TWICE DAILY RINSE  MOUTH  AFTER  EACH  INHALATION 09/07/17  Yes Glean Hess, MD  Tiotropium Bromide Monohydrate (SPIRIVA HANDIHALER IN) Inhale into the lungs.   Yes [provider]  traZODone (DESYREL) 50 MG tablet Take 0.5 tablets (25 mg total) by mouth at bedtime. 03/16/17  Yes Glean Hess, MD  UNABLE TO FIND Oxygen 2 lpm via [place]   Yes [provider]    No Known Allergies  Past Surgical History:  Procedure Laterality Date  . COLONOSCOPY WITH PROPOFOL N/A 08/11/2016   Procedure: COLONOSCOPY WITH PROPOFOL;  Surgeon: [name], MD;  Location: [place];  Service: Endoscopy;  Laterality: N/A;  . FLEXIBLE BRONCHOSCOPY N/A 02/12/2017   Procedure: FLEXIBLE BRONCHOSCOPY;  Surgeon: Wilhelmina Mcardle, MD;  Location: [facility];  Service: Pulmonary;  Laterality: N/A;  . OOPHORECTOMY Left 1970  . POLYPECTOMY  08/11/2016   Procedure: POLYPECTOMY;  Surgeon: [name], MD;  Location: [place];   Service: Endoscopy;;    Social History   Tobacco Use  . Smoking status: Former Smoker    Packs/day: 0.50    Years: 40.00    Pack years: 20.00    Types: Cigarettes    Last attempt to quit: 07/28/2004    Years since quitting: 13.5  . Smokeless tobacco: Never Used  . Tobacco comment: smoking cessation materials not required  Substance Use Topics  . Alcohol use: No    Alcohol/week: 0.0 oz  . Drug use: No     Medication list has been reviewed and updated.  Current Meds  Medication Sig  . acetaminophen (TYLENOL) 500 MG tablet Take 1,000 mg by mouth every 6 (six) hours as needed (for pain.).  Marland Kitchen albuterol (ACCUNEB) 1.25 MG/3ML nebulizer solution Take 3 mLs (1.25 mg total) by nebulization every 6 (six) hours as needed for wheezing.  Marland Kitchen albuterol (PROVENTIL) (2.5 MG/3ML) 0.083% nebulizer solution 2.5 mg.  . aspirin EC 81 MG tablet Take 1 tablet (81 mg total) by mouth daily.  Marland Kitchen dexamethasone (DECADRON) 4 MG tablet Take 4 mg by mouth.  . docusate sodium (COLACE) 100 MG capsule Take 100 mg  by mouth.  . estradiol (ESTRACE VAGINAL) 0.1 MG/GM vaginal cream INSERT 0.5GRAMS VAGINALLY ONCE WEEKLY  . ibuprofen (ADVIL,MOTRIN) 400 MG tablet Take 400 mg by mouth.  Marland Kitchen LORazepam (ATIVAN) 1 MG tablet   . losartan (COZAAR) 100 MG tablet TAKE ONE TABLET BY MOUTH ONCE DAILY  . MULTIPLE VITAMINS-MINERALS PO Take 1 tablet by mouth daily.  . Oxyquinoline-Sod Lauryl Sulf (TRIMO-SAN) 0.025-0.01 % GEL Place 0.025 mLs vaginally once a week.  Marland Kitchen PROAIR HFA 108 (90 Base) MCG/ACT inhaler INHALE TWO PUFFS BY MOUTH EVERY 4 HOURS AS NEEDED FOR WHEEZING AND FOR SHORTNESS OF BREATH  . ranitidine (ZANTAC) 150 MG tablet Take 1 tablet (150 mg total) by mouth 2 (two) times daily.  . simvastatin (ZOCOR) 40 MG tablet TAKE 1 TABLET BY MOUTH ONCE DAILY AT  6PM  . SYMBICORT 160-4.5 MCG/ACT inhaler INHALE 1 PUFF BY MOUTH TWICE DAILY RINSE  MOUTH  AFTER  EACH  INHALATION  . Tiotropium Bromide Monohydrate (SPIRIVA HANDIHALER IN)  Inhale into the lungs.  . traZODone (DESYREL) 50 MG tablet Take 0.5 tablets (25 mg total) by mouth at bedtime.  Marland Kitchen UNABLE TO FIND Oxygen 2 lpm via [place]    PHQ 2/9 Scores 09/30/2017 09/30/2017 04/07/2017 03/27/2016  PHQ - 2 Score 0 0 0 0  PHQ- 9 Score 0 - - -    Physical Exam  Constitutional: She is oriented to person, place, and time. She appears well-developed and well-nourished. No distress.  HENT:  Head: Normocephalic and atraumatic.  Neck: Normal range of motion. Neck supple. Carotid bruit is not present.  Cardiovascular: Normal rate, regular rhythm and normal heart sounds.  Pulmonary/Chest: Effort normal. No respiratory distress. She has decreased breath sounds. She has no wheezes. She has no rhonchi.  Musculoskeletal: Normal range of motion.  Neurological: She is alert and oriented to person, place, and time.  Skin: Skin is warm and dry. No rash noted.  Psychiatric: She has a normal mood and affect. Her speech is normal and behavior is normal. Thought content normal.  Nursing note and vitals reviewed.   BP 134/68   Pulse 83   Temp 97.6 F (36.4 C)   Ht _0  (1.626 m)   Wt 134 lb (60.8 kg)   SpO2 96%   BMI 23.00 kg/m   Assessment and Plan: 1. Other emphysema ([place]) Recovering well from recent exacerbation Continue O2 Continue Spiriva -call for refill if needed  2. Essential (primary) hypertension controlled  3. Anemia of chronic illness CBC to be recheck at Oncology appt  4. Primary malignant neoplasm of lung metastatic to other site, unspecified laterality [facility]) Doing well Continue follow up with Oncology  No orders of the defined types were placed in this encounter.   Partially dictated using Editor, commissioning. Any errors are unintentional.  Halina Maidens, MD [facility]  02/02/2018

## 2018-02-05 DIAGNOSIS — E785 Hyperlipidemia, unspecified: Secondary | ICD-10-CM | POA: Diagnosis not present

## 2018-02-05 DIAGNOSIS — J449 Chronic obstructive pulmonary disease, unspecified: Secondary | ICD-10-CM | POA: Diagnosis not present

## 2018-02-05 DIAGNOSIS — R0602 Shortness of breath: Secondary | ICD-10-CM | POA: Diagnosis not present

## 2018-02-05 DIAGNOSIS — Z8673 Personal history of transient ischemic attack (TIA), and cerebral infarction without residual deficits: Secondary | ICD-10-CM | POA: Diagnosis not present

## 2018-02-05 DIAGNOSIS — C3412 Malignant neoplasm of upper lobe, left bronchus or lung: Secondary | ICD-10-CM | POA: Diagnosis not present

## 2018-02-05 DIAGNOSIS — Z79899 Other long term (current) drug therapy: Secondary | ICD-10-CM | POA: Diagnosis not present

## 2018-02-05 DIAGNOSIS — C3490 Malignant neoplasm of unspecified part of unspecified bronchus or lung: Secondary | ICD-10-CM | POA: Diagnosis not present

## 2018-02-05 DIAGNOSIS — E0500 Thyrotoxicosis with diffuse goiter without thyrotoxic crisis or storm: Secondary | ICD-10-CM | POA: Diagnosis not present

## 2018-02-05 DIAGNOSIS — Z7982 Long term (current) use of aspirin: Secondary | ICD-10-CM | POA: Diagnosis not present

## 2018-02-05 DIAGNOSIS — J9611 Chronic respiratory failure with hypoxia: Secondary | ICD-10-CM | POA: Diagnosis not present

## 2018-02-05 DIAGNOSIS — C7931 Secondary malignant neoplasm of brain: Secondary | ICD-10-CM | POA: Diagnosis not present

## 2018-02-05 DIAGNOSIS — R530 Neoplastic (malignant) related fatigue: Secondary | ICD-10-CM | POA: Diagnosis not present

## 2018-02-05 DIAGNOSIS — Z7189 Other specified counseling: Secondary | ICD-10-CM | POA: Diagnosis not present

## 2018-02-05 DIAGNOSIS — D649 Anemia, unspecified: Secondary | ICD-10-CM | POA: Diagnosis not present

## 2018-02-05 DIAGNOSIS — Z515 Encounter for palliative care: Secondary | ICD-10-CM | POA: Diagnosis not present

## 2018-02-05 DIAGNOSIS — Z90721 Acquired absence of ovaries, unilateral: Secondary | ICD-10-CM | POA: Diagnosis not present

## 2018-02-05 DIAGNOSIS — I10 Essential (primary) hypertension: Secondary | ICD-10-CM | POA: Diagnosis not present

## 2018-02-05 DIAGNOSIS — J439 Emphysema, unspecified: Secondary | ICD-10-CM | POA: Diagnosis not present

## 2018-02-05 DIAGNOSIS — Z7951 Long term (current) use of inhaled steroids: Secondary | ICD-10-CM | POA: Diagnosis not present

## 2018-02-05 DIAGNOSIS — Z9981 Dependence on supplemental oxygen: Secondary | ICD-10-CM | POA: Diagnosis not present

## 2018-02-05 DIAGNOSIS — Z87891 Personal history of nicotine dependence: Secondary | ICD-10-CM | POA: Diagnosis not present

## 2018-02-06 ENCOUNTER — Other Ambulatory Visit: Payer: Self-pay | Admitting: Internal Medicine

## 2018-02-06 DIAGNOSIS — I10 Essential (primary) hypertension: Secondary | ICD-10-CM

## 2018-03-16 ENCOUNTER — Other Ambulatory Visit: Payer: Self-pay | Admitting: Internal Medicine

## 2018-03-16 DIAGNOSIS — I639 Cerebral infarction, unspecified: Secondary | ICD-10-CM

## 2018-03-17 ENCOUNTER — Encounter: Payer: Self-pay | Admitting: Obstetrics and Gynecology

## 2018-03-17 DIAGNOSIS — R0609 Other forms of dyspnea: Secondary | ICD-10-CM | POA: Diagnosis not present

## 2018-03-17 DIAGNOSIS — J449 Chronic obstructive pulmonary disease, unspecified: Secondary | ICD-10-CM | POA: Diagnosis not present

## 2018-03-17 DIAGNOSIS — Z515 Encounter for palliative care: Secondary | ICD-10-CM | POA: Diagnosis not present

## 2018-03-17 DIAGNOSIS — C3490 Malignant neoplasm of unspecified part of unspecified bronchus or lung: Secondary | ICD-10-CM | POA: Diagnosis not present

## 2018-03-17 DIAGNOSIS — K5900 Constipation, unspecified: Secondary | ICD-10-CM | POA: Diagnosis not present

## 2018-03-17 DIAGNOSIS — N858 Other specified noninflammatory disorders of uterus: Secondary | ICD-10-CM | POA: Diagnosis not present

## 2018-03-17 DIAGNOSIS — N281 Cyst of kidney, acquired: Secondary | ICD-10-CM | POA: Diagnosis not present

## 2018-03-17 DIAGNOSIS — C3412 Malignant neoplasm of upper lobe, left bronchus or lung: Secondary | ICD-10-CM | POA: Diagnosis not present

## 2018-03-17 DIAGNOSIS — K8689 Other specified diseases of pancreas: Secondary | ICD-10-CM | POA: Diagnosis not present

## 2018-03-17 DIAGNOSIS — C7931 Secondary malignant neoplasm of brain: Secondary | ICD-10-CM | POA: Diagnosis not present

## 2018-03-22 IMAGING — CR DG CHEST 2V
2 series · 2 of 2 positions shown · non-contrast
Comparison: January 22, 2017

CLINICAL DATA: Patient was recently treated for pneumonia with
continued shortness of breath.

EXAM:
CHEST  2 VIEW

[chest pa]
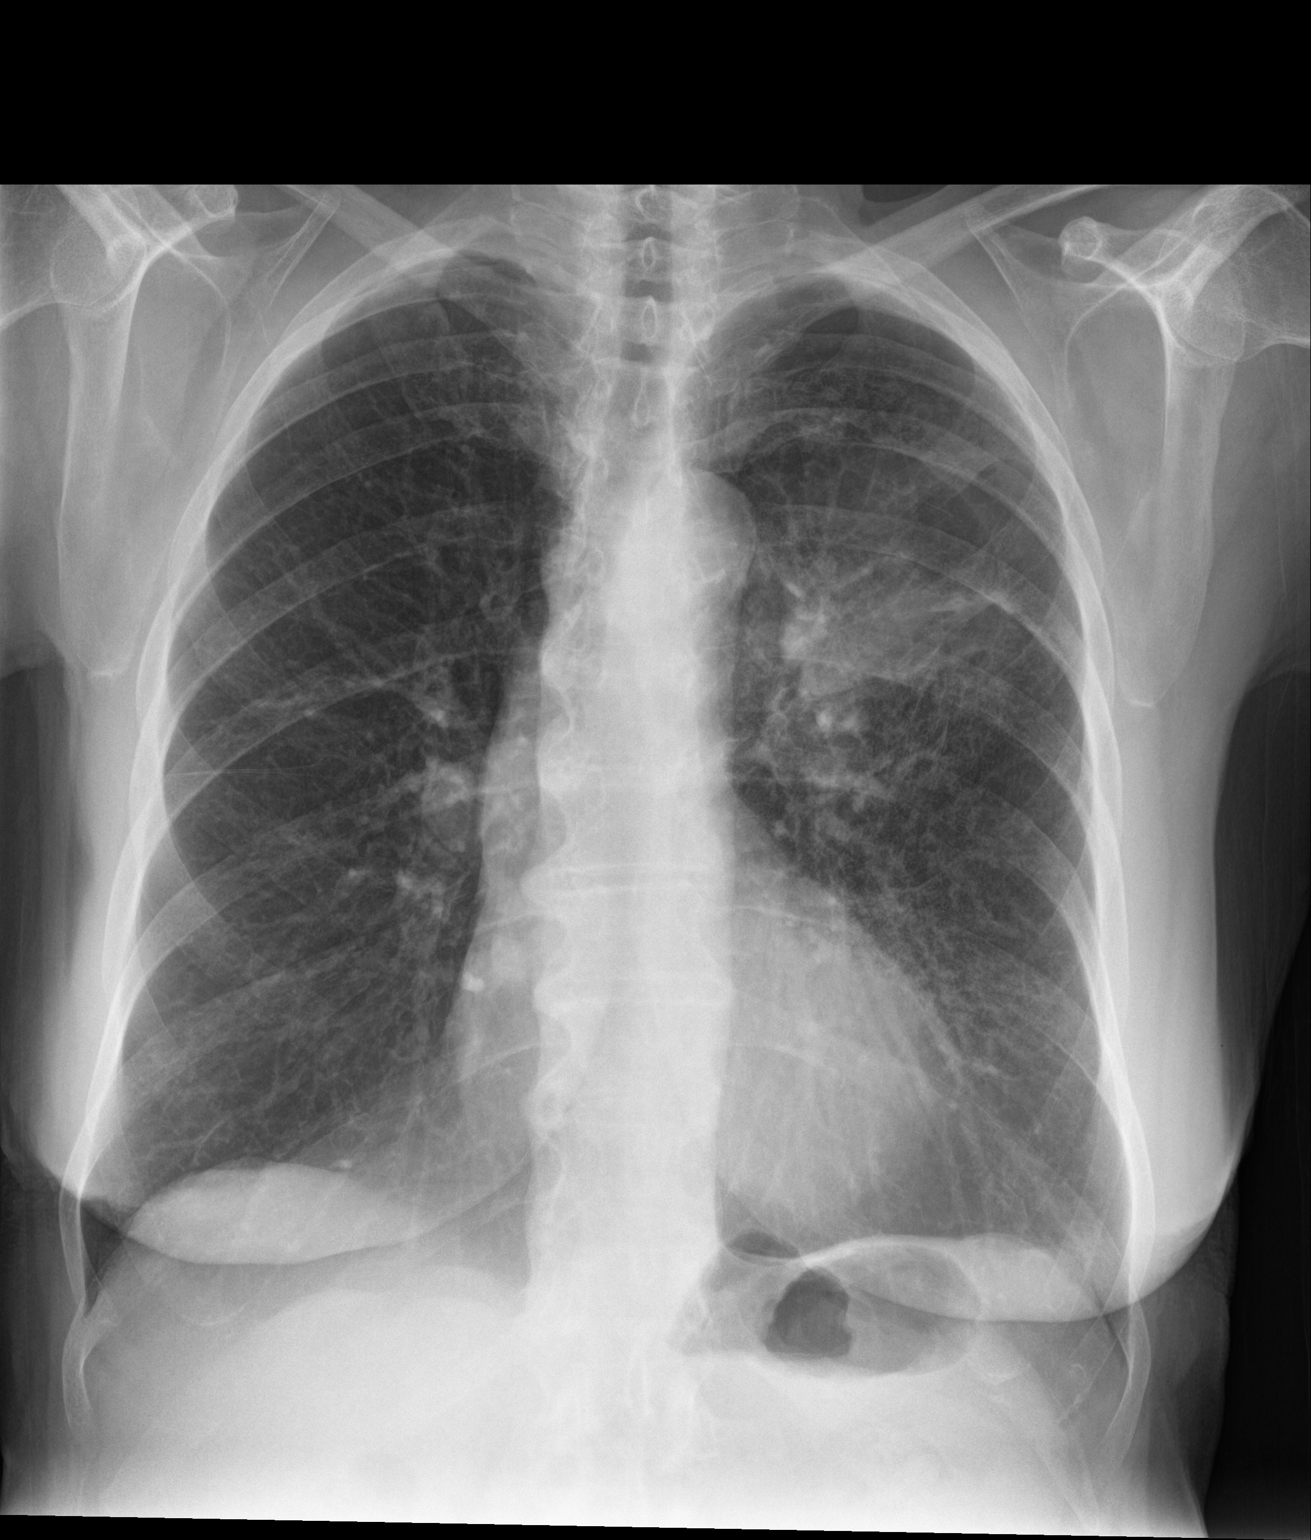

[chest lat]
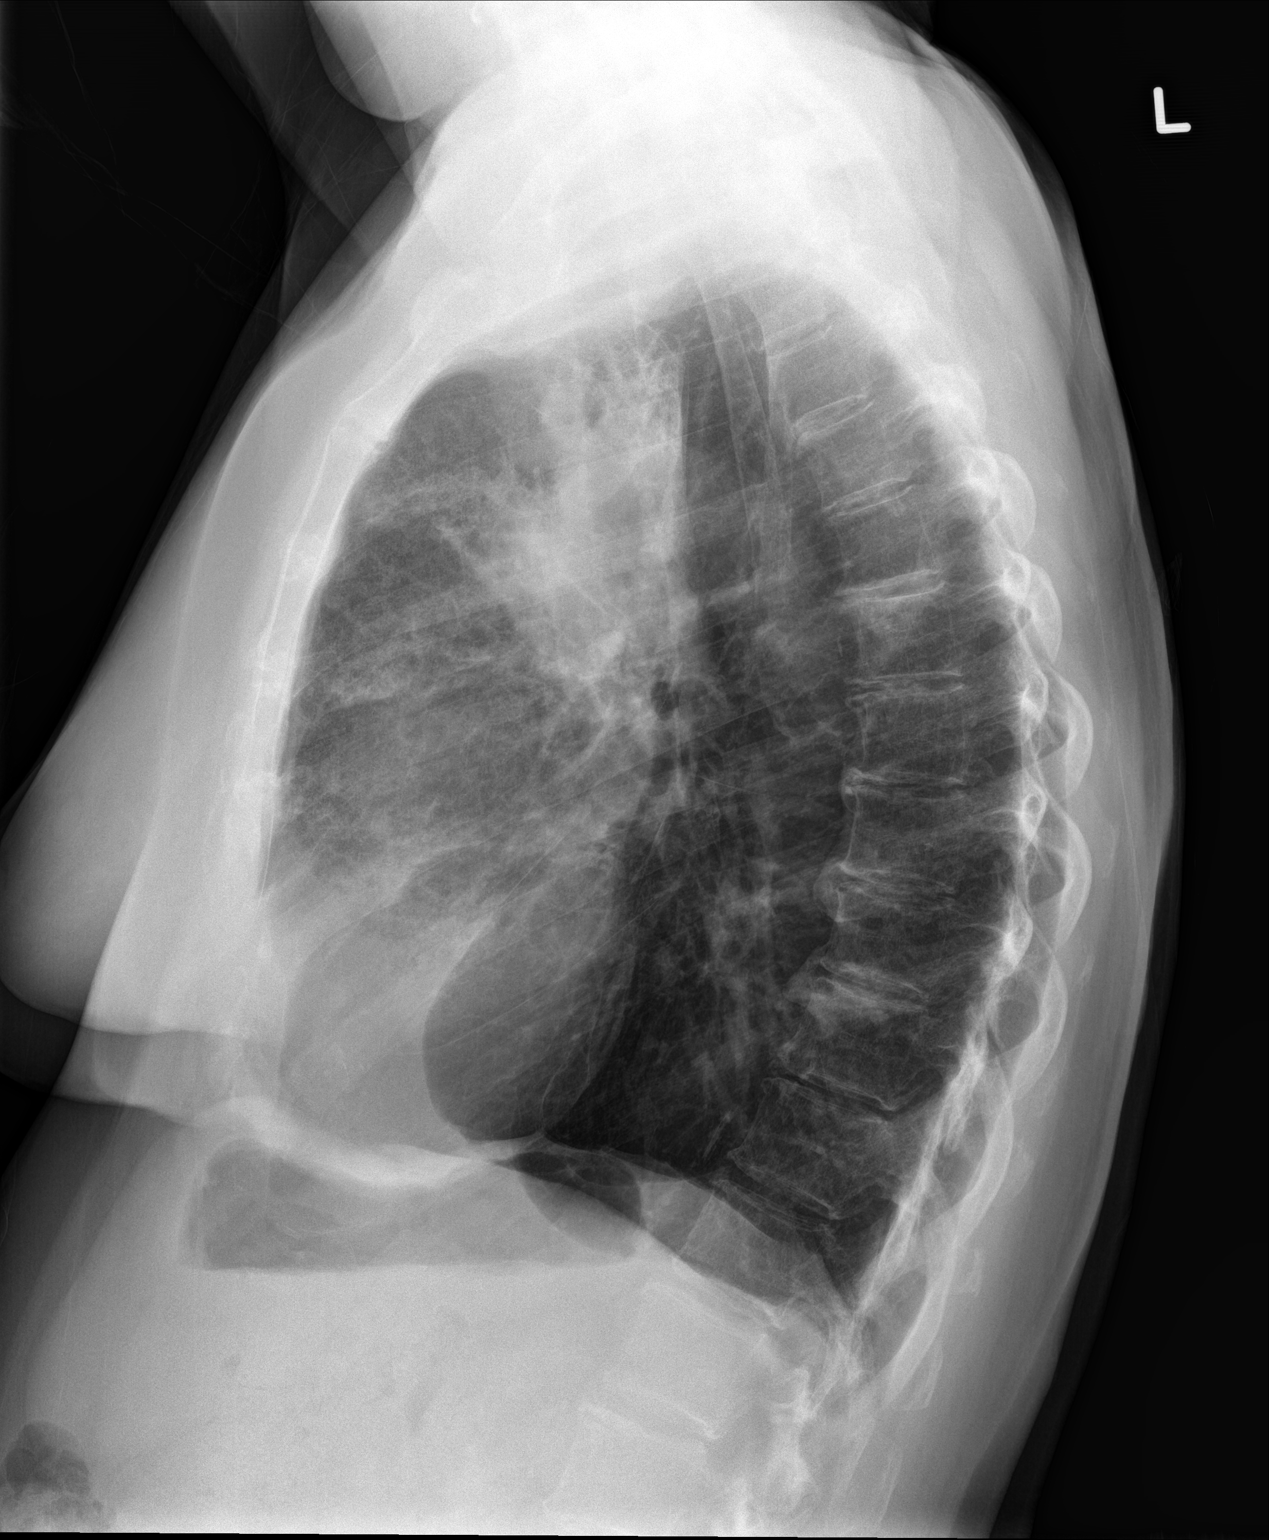

[2 of 2 positions shown; findings below may reference images not displayed]

FINDINGS: The heart size and mediastinal contours are within normal limits.
Previously noted left upper lobe pneumonia is significantly
decreased with residual opacity in in the left upper lobe. There is
no pleural effusion. There is no pulmonary edema. The visualized
skeletal structures are stable.
IMPRESSION: Previously noted left upper lobe pneumonia is significantly
decreased compared to the previous exam. There is residual opacity
in the left upper lobe. Follow-up chest x-ray is recommended to
ensure complete resolution.

## 2018-03-23 ENCOUNTER — Other Ambulatory Visit: Payer: Self-pay | Admitting: Internal Medicine

## 2018-03-23 DIAGNOSIS — J438 Other emphysema: Secondary | ICD-10-CM

## 2018-03-24 DIAGNOSIS — Z87891 Personal history of nicotine dependence: Secondary | ICD-10-CM | POA: Diagnosis not present

## 2018-03-24 DIAGNOSIS — J439 Emphysema, unspecified: Secondary | ICD-10-CM | POA: Diagnosis not present

## 2018-03-24 DIAGNOSIS — I10 Essential (primary) hypertension: Secondary | ICD-10-CM | POA: Diagnosis not present

## 2018-03-24 DIAGNOSIS — C7800 Secondary malignant neoplasm of unspecified lung: Secondary | ICD-10-CM | POA: Diagnosis not present

## 2018-03-24 DIAGNOSIS — Z7982 Long term (current) use of aspirin: Secondary | ICD-10-CM | POA: Diagnosis not present

## 2018-03-24 DIAGNOSIS — J449 Chronic obstructive pulmonary disease, unspecified: Secondary | ICD-10-CM | POA: Diagnosis not present

## 2018-03-24 DIAGNOSIS — C801 Malignant (primary) neoplasm, unspecified: Secondary | ICD-10-CM | POA: Diagnosis not present

## 2018-03-24 DIAGNOSIS — Z515 Encounter for palliative care: Secondary | ICD-10-CM | POA: Diagnosis not present

## 2018-03-24 DIAGNOSIS — C3412 Malignant neoplasm of upper lobe, left bronchus or lung: Secondary | ICD-10-CM | POA: Diagnosis not present

## 2018-03-24 DIAGNOSIS — Z8673 Personal history of transient ischemic attack (TIA), and cerebral infarction without residual deficits: Secondary | ICD-10-CM | POA: Diagnosis not present

## 2018-03-24 DIAGNOSIS — Z9221 Personal history of antineoplastic chemotherapy: Secondary | ICD-10-CM | POA: Diagnosis not present

## 2018-03-25 ENCOUNTER — Encounter: Payer: Self-pay | Admitting: Obstetrics and Gynecology

## 2018-03-27 IMAGING — CR DG CHEST 2V
2 series · 2 of 2 positions shown · non-contrast
Comparison: 02/02/2017

CLINICAL DATA: Pt with complaints of shortness of breath and
weakness. Reports she was recently treated inpatient for left upper
pneumonia the very end [DATE], had a follow-up chest x-ray this
past week that showed improving pneumonia. Son reports that
patient's shortness of breath acutely returned this past [REDACTED]
or [REDACTED] morning. Patient reports some intermittent wheezing,
last use inhaler at 6 AM. Reports feels shaky and generalized weak.
Decreased appetite, continues to drink fluids well. Does further
report has some left posterior calf pain that is atypical for her
and different. Denies known fevers. Former smoker.

EXAM:
CHEST  2 VIEW

[chest pa]
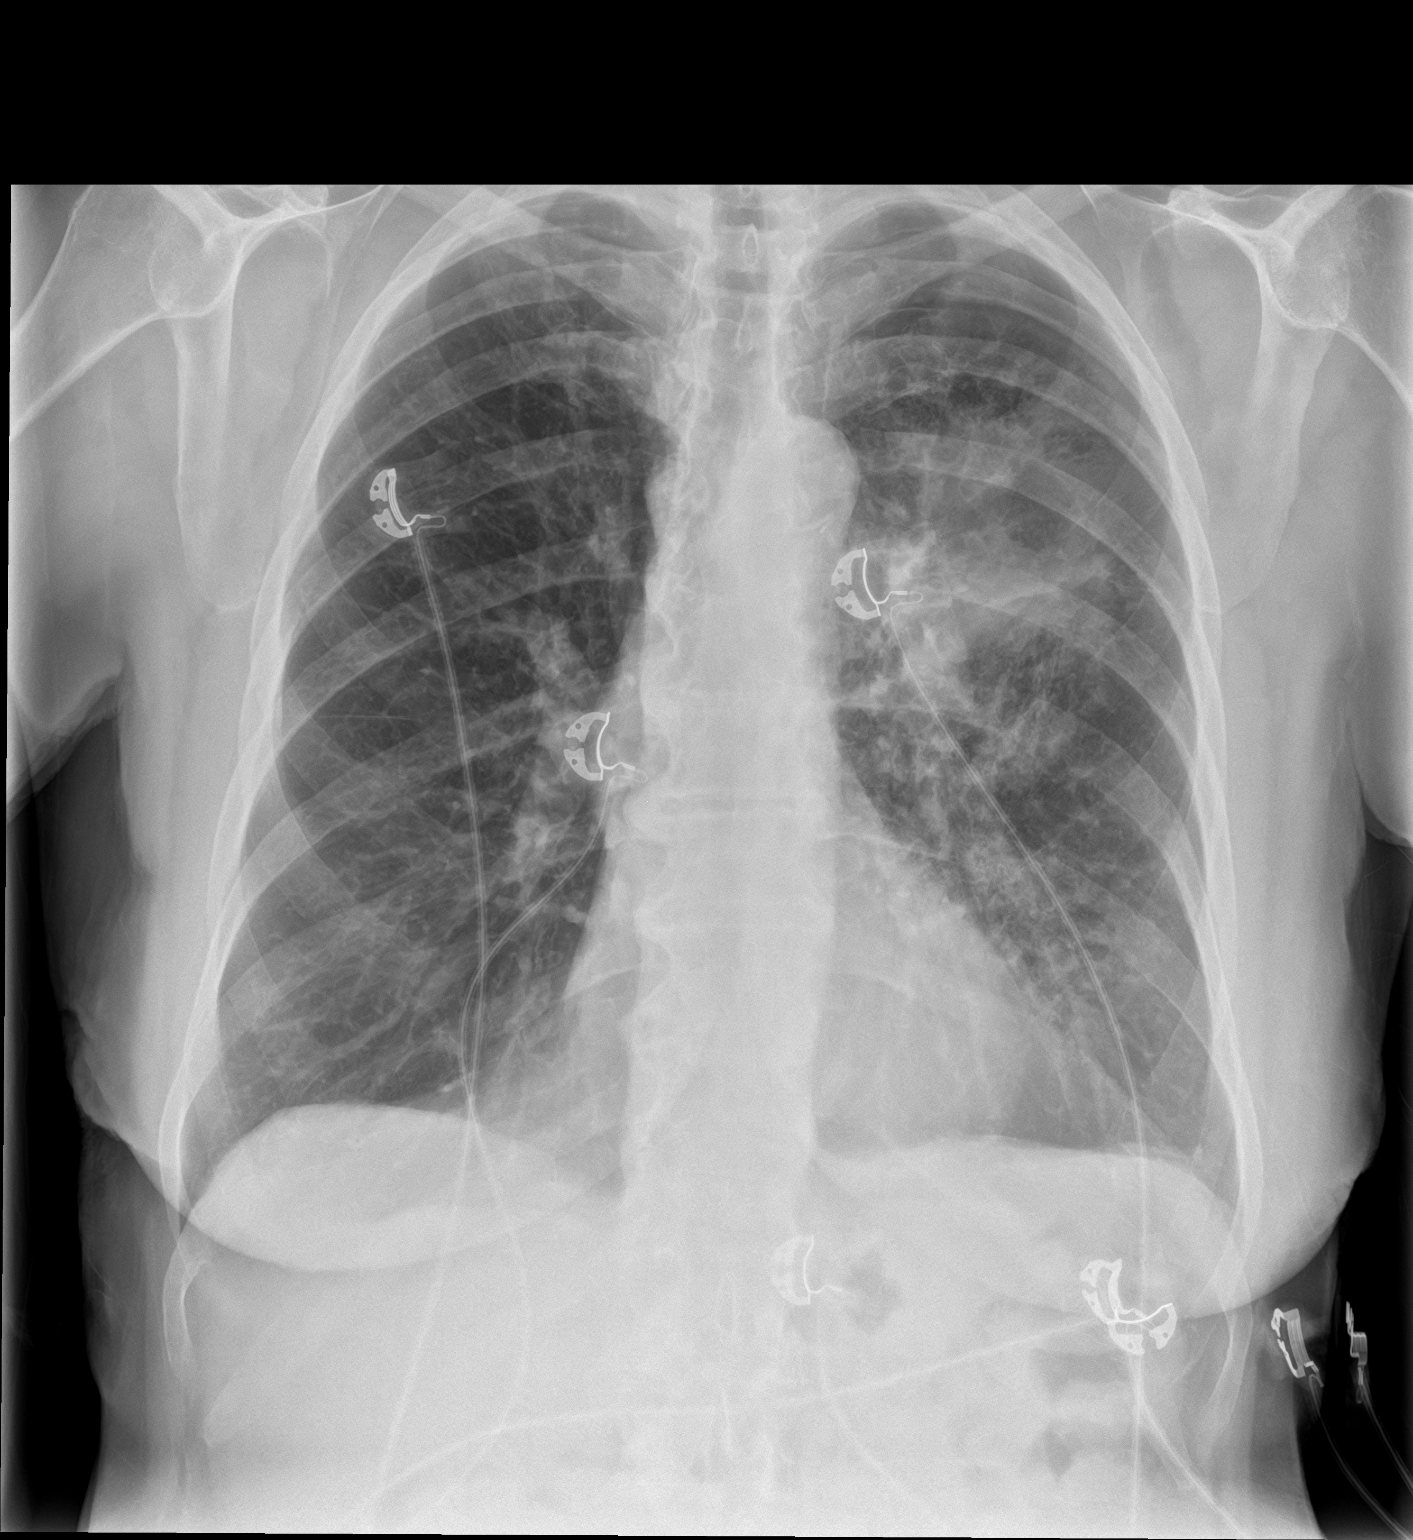

[chest lat]
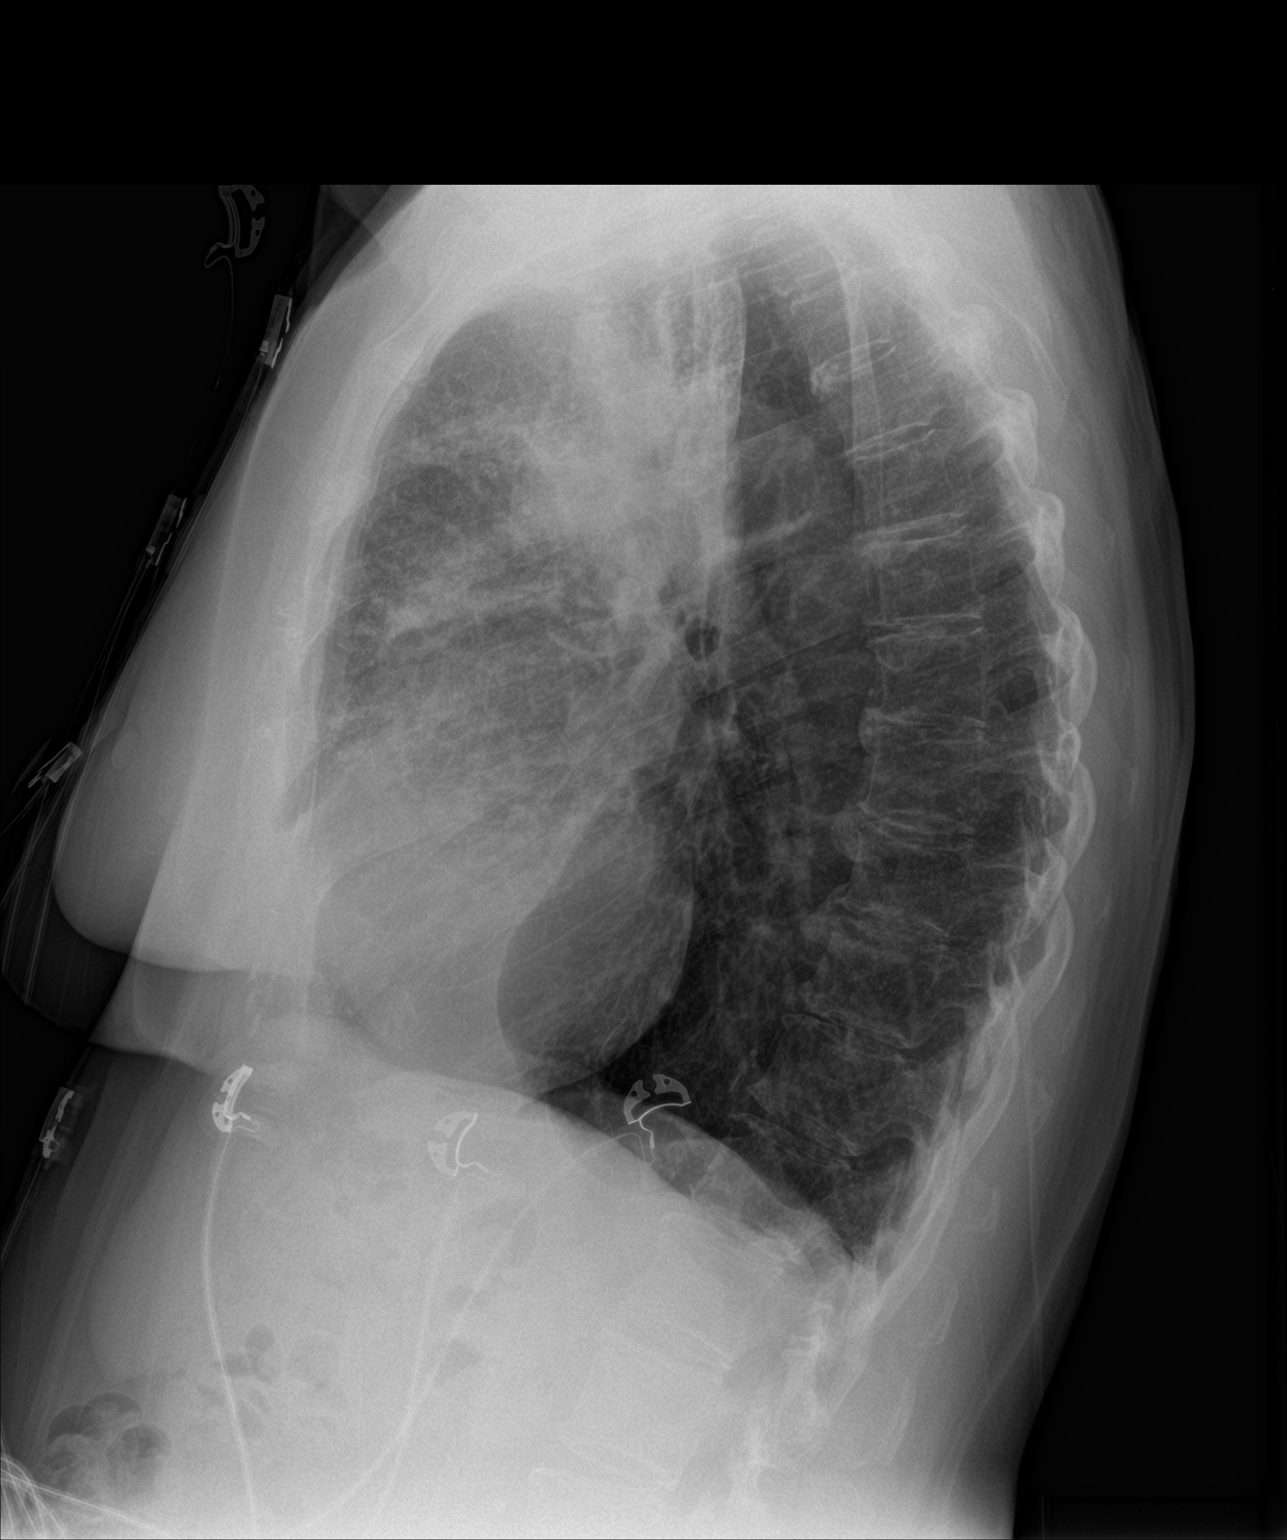

[2 of 2 positions shown; findings below may reference images not displayed]

FINDINGS: The area of opacity in the central left upper lobe has mildly
increased from the prior study. Remainder of the lungs is clear.
Lungs are hyperexpanded.

No pleural effusion.  No pneumothorax.

Cardiac silhouette is normal in size. No mediastinal masses or
convincing adenopathy. Left superior hilum is obscured by the
contiguous left upper lobe opacity. Right hilum is unremarkable.

Skeletal structures are demineralized but intact.
IMPRESSION: 1. Left upper lobe opacity has mildly increased since the most
recent prior exam. Although this remains consistent with pneumonia,
an underlying mass should be considered. Recommend followup chest CT
with contrast for further assessment.
2. COPD.

## 2018-03-30 ENCOUNTER — Ambulatory Visit (INDEPENDENT_AMBULATORY_CARE_PROVIDER_SITE_OTHER): Payer: Medicare Other | Admitting: Internal Medicine

## 2018-03-30 ENCOUNTER — Encounter: Payer: Self-pay | Admitting: Internal Medicine

## 2018-03-30 VITALS — BP 112/70 | HR 77 | Ht 64.0 in | Wt 136.4 lb

## 2018-03-30 DIAGNOSIS — I10 Essential (primary) hypertension: Secondary | ICD-10-CM

## 2018-03-30 DIAGNOSIS — I639 Cerebral infarction, unspecified: Secondary | ICD-10-CM | POA: Diagnosis not present

## 2018-03-30 DIAGNOSIS — Z23 Encounter for immunization: Secondary | ICD-10-CM

## 2018-03-30 DIAGNOSIS — I700 Atherosclerosis of aorta: Secondary | ICD-10-CM | POA: Diagnosis not present

## 2018-03-30 DIAGNOSIS — C3490 Malignant neoplasm of unspecified part of unspecified bronchus or lung: Secondary | ICD-10-CM | POA: Diagnosis not present

## 2018-03-30 NOTE — Patient Instructions (Signed)

## 2018-03-30 NOTE — Progress Notes (Signed)
Date:  03/30/2018   Name:  Paige Hart   DOB:  05-12-1935   MRN:  [id number]   Chief Complaint: Hypertension and Immunizations (Wants to know if its safe for her to get the high dose flu vaccine? If so, she would like this today.) Hypertension  This is a chronic problem. The problem is unchanged. Pertinent negatives include no headaches or shortness of breath. Past treatments include angiotensin blockers. The current treatment provides significant improvement.   Lung Cancer with brain mets - no longer doing any chemotx due to non responsiveness.  She feels well and has a good attitude.  She is able to shop, cook and keep her garden.  She is still seeing the oncologist and will probably soon get established with Hospice.  They say that she has months to live, rather than weeks and she is hopeful.  Shortness of breath - seen by pulmonary and started on Anoro, in addition to Symbicort.  She is breathing well on O2.  No cough, wheezing or production. Review of Systems  Constitutional: Negative for chills, fatigue and fever.  Respiratory: Negative for cough, chest tightness, shortness of breath and wheezing.   Gastrointestinal: Negative for abdominal pain.  Musculoskeletal: Negative for arthralgias.  Neurological: Negative for dizziness, light-headedness, numbness and headaches.  Psychiatric/Behavioral: Negative for sleep disturbance. The patient is not nervous/anxious.     Patient Active Problem List   Diagnosis Date Noted  . Anemia of chronic illness 02/02/2018  . Dependence on supplemental oxygen 11/26/2017  . Oxygen dependent 11/26/2017  . Brain metastasis ([place]) 06/10/2017  . Esophageal dysphagia 04/07/2017  . Adjustment insomnia 03/16/2017  . Lung cancer, primary, with metastasis from lung to other site [facility]) 03/11/2017  . Nodule of right lung 03/11/2017  . Status post CVA 02/26/2017  . Goals of care, counseling/discussion 02/19/2017  . Ulcerative colitis ([place]) 09/02/2016  .  Rectal polyp   . Polyp of sigmoid colon   . Benign neoplasm of descending colon   . Chronic idiopathic constipation 07/09/2016  . Bleeding hemorrhoid 07/03/2016  . Cystocele, midline 12/27/2015  . Uterine procidentia 12/27/2015  . Pessary maintenance 03/22/2015  . Allergic conjunctivitis 11/17/2014  . Allergic rhinitis 11/17/2014  . Essential (primary) hypertension 11/17/2014  . Graves disease 11/17/2014  . Bursitis of hip 11/17/2014  . Combined fat and carbohydrate induced hyperlipemia 11/17/2014  . Chronic obstructive pulmonary emphysema ([place]) 11/17/2014  . Pelvic relaxation due to uterovaginal prolapse - pessary use 11/17/2014    No Known Allergies  Past Surgical History:  Procedure Laterality Date  . COLONOSCOPY WITH PROPOFOL N/A 08/11/2016   Procedure: COLONOSCOPY WITH PROPOFOL;  Surgeon: [name], MD;  Location: [place];  Service: Endoscopy;  Laterality: N/A;  . FLEXIBLE BRONCHOSCOPY N/A 02/12/2017   Procedure: FLEXIBLE BRONCHOSCOPY;  Surgeon: Wilhelmina Mcardle, MD;  Location: [facility];  Service: Pulmonary;  Laterality: N/A;  . OOPHORECTOMY Left 1970  . POLYPECTOMY  08/11/2016   Procedure: POLYPECTOMY;  Surgeon: [name], MD;  Location: [place];  Service: Endoscopy;;    Social History   Tobacco Use  . Smoking status: Former Smoker    Packs/day: 0.50    Years: 40.00    Pack years: 20.00    Types: Cigarettes    Last attempt to quit: 07/28/2004    Years since quitting: 13.6  . Smokeless tobacco: Never Used  . Tobacco comment: smoking cessation materials not required  Substance Use Topics  . Alcohol use: No    Alcohol/week:  0.0 standard drinks  . Drug use: No     Medication list has been reviewed and updated.  Current Meds  Medication Sig  . acetaminophen (TYLENOL) 500 MG tablet Take 1,000 mg by mouth every 6 (six) hours as needed (for pain.).  Marland Kitchen albuterol (ACCUNEB) 1.25 MG/3ML nebulizer solution USE 1 VIAL IN NEBULIZER EVERY 6 HOURS AS  NEEDED FOR WHEEZING  . albuterol (PROVENTIL HFA;VENTOLIN HFA) 108 (90 Base) MCG/ACT inhaler Inhale into the lungs every 6 (six) hours as needed for wheezing or shortness of breath.  Marland Kitchen albuterol (PROVENTIL) (2.5 MG/3ML) 0.083% nebulizer solution 2.5 mg.  . dexamethasone (DECADRON) 4 MG tablet Take 4 mg by mouth.  . docusate sodium (COLACE) 100 MG capsule Take 100 mg by mouth.  . estradiol (ESTRACE VAGINAL) 0.1 MG/GM vaginal cream INSERT 0.5GRAMS VAGINALLY ONCE WEEKLY  . ibuprofen (ADVIL,MOTRIN) 400 MG tablet Take 400 mg by mouth.  Marland Kitchen LORazepam (ATIVAN) 1 MG tablet   . losartan (COZAAR) 100 MG tablet TAKE 1 TABLET BY MOUTH ONCE DAILY  . MULTIPLE VITAMINS-MINERALS PO Take 1 tablet by mouth daily.  . OXYGEN Inhale 2 L/min into the lungs daily.  . Oxyquinoline-Sod Lauryl Sulf (TRIMO-SAN) 0.025-0.01 % GEL Place 0.025 mLs vaginally once a week.  . ranitidine (ZANTAC) 150 MG tablet Take 1 tablet (150 mg total) by mouth 2 (two) times daily.  . simvastatin (ZOCOR) 40 MG tablet TAKE 1 TABLET BY MOUTH ONCE DAILY AT 6 PM  . SYMBICORT 160-4.5 MCG/ACT inhaler INHALE 1 PUFF BY MOUTH TWICE DAILY RINSE  MOUTH  AFTER  EACH  INHALATION  . traZODone (DESYREL) 50 MG tablet Take 0.5 tablets (25 mg total) by mouth at bedtime.  Marland Kitchen umeclidinium-vilanterol (ANORO ELLIPTA) 62.5-25 MCG/INH AEPB Inhale 1 puff into the lungs daily.  Marland Kitchen UNABLE TO FIND Oxygen 2 lpm via [place]    PHQ 2/9 Scores 09/30/2017 09/30/2017 04/07/2017 03/27/2016  PHQ - 2 Score 0 0 0 0  PHQ- 9 Score 0 - - -    Physical Exam  Constitutional: She appears well-developed and well-nourished.  Neck: Normal range of motion. Neck supple. Carotid bruit is not present.  Cardiovascular: Regular rhythm, intact distal pulses and normal pulses.  Murmur heard.  Systolic murmur is present with a grade of 3/6. Pulmonary/Chest: Effort normal. She has decreased breath sounds. She has no wheezes. She has no rhonchi. She has no rales.  Abdominal: Soft.  Musculoskeletal: She  exhibits no edema or tenderness.  Lymphadenopathy:    She has no cervical adenopathy.  Skin: Skin is warm and dry.    BP 112/70 (BP Location: Right Arm, Patient Position: Sitting, Cuff Size: Normal)   Pulse 77   Ht _0  (1.626 m)   Wt 136 lb 6.4 oz (61.9 kg)   SpO2 95%   BMI 23.41 kg/m   Assessment and Plan: 1. Essential (primary) hypertension controlled  2. Need for influenza vaccination - Flu vaccine HIGH DOSE PF  3. Atherosclerosis of aorta (HCC) Mild, no aggressive therapy; continue statin  4. Primary malignant neoplasm of lung metastatic to other site, unspecified laterality ([place]) Followed by Oncology Continue Anoro and symbicort, O2 for sx relief Call med for Anoro Rx when needed   No orders of the defined types were placed in this encounter.   Partially dictated using Editor, commissioning. Any errors are unintentional.  Halina Maidens, MD [facility]  03/30/2018

## 2018-04-14 DIAGNOSIS — J449 Chronic obstructive pulmonary disease, unspecified: Secondary | ICD-10-CM | POA: Diagnosis not present

## 2018-04-14 DIAGNOSIS — R0602 Shortness of breath: Secondary | ICD-10-CM | POA: Diagnosis not present

## 2018-04-14 DIAGNOSIS — K5900 Constipation, unspecified: Secondary | ICD-10-CM | POA: Diagnosis not present

## 2018-04-14 DIAGNOSIS — C3490 Malignant neoplasm of unspecified part of unspecified bronchus or lung: Secondary | ICD-10-CM | POA: Diagnosis not present

## 2018-04-14 DIAGNOSIS — C3412 Malignant neoplasm of upper lobe, left bronchus or lung: Secondary | ICD-10-CM | POA: Diagnosis not present

## 2018-04-14 DIAGNOSIS — Z515 Encounter for palliative care: Secondary | ICD-10-CM | POA: Diagnosis not present

## 2018-04-14 DIAGNOSIS — Z7189 Other specified counseling: Secondary | ICD-10-CM | POA: Diagnosis not present

## 2018-04-18 DIAGNOSIS — C7801 Secondary malignant neoplasm of right lung: Secondary | ICD-10-CM | POA: Diagnosis not present

## 2018-04-18 DIAGNOSIS — E785 Hyperlipidemia, unspecified: Secondary | ICD-10-CM | POA: Diagnosis not present

## 2018-04-18 DIAGNOSIS — K5900 Constipation, unspecified: Secondary | ICD-10-CM | POA: Diagnosis not present

## 2018-04-18 DIAGNOSIS — I10 Essential (primary) hypertension: Secondary | ICD-10-CM | POA: Diagnosis not present

## 2018-04-18 DIAGNOSIS — Z7982 Long term (current) use of aspirin: Secondary | ICD-10-CM | POA: Diagnosis not present

## 2018-04-18 DIAGNOSIS — J439 Emphysema, unspecified: Secondary | ICD-10-CM | POA: Diagnosis not present

## 2018-04-18 DIAGNOSIS — R530 Neoplastic (malignant) related fatigue: Secondary | ICD-10-CM | POA: Diagnosis not present

## 2018-04-18 DIAGNOSIS — D630 Anemia in neoplastic disease: Secondary | ICD-10-CM | POA: Diagnosis not present

## 2018-04-18 DIAGNOSIS — E0500 Thyrotoxicosis with diffuse goiter without thyrotoxic crisis or storm: Secondary | ICD-10-CM | POA: Diagnosis not present

## 2018-04-18 DIAGNOSIS — J9611 Chronic respiratory failure with hypoxia: Secondary | ICD-10-CM | POA: Diagnosis not present

## 2018-04-18 DIAGNOSIS — Z8673 Personal history of transient ischemic attack (TIA), and cerebral infarction without residual deficits: Secondary | ICD-10-CM | POA: Diagnosis not present

## 2018-04-18 DIAGNOSIS — C3412 Malignant neoplasm of upper lobe, left bronchus or lung: Secondary | ICD-10-CM | POA: Diagnosis not present

## 2018-04-18 DIAGNOSIS — C7931 Secondary malignant neoplasm of brain: Secondary | ICD-10-CM | POA: Diagnosis not present

## 2018-04-18 DIAGNOSIS — C781 Secondary malignant neoplasm of mediastinum: Secondary | ICD-10-CM | POA: Diagnosis not present

## 2018-04-18 DIAGNOSIS — Z923 Personal history of irradiation: Secondary | ICD-10-CM | POA: Diagnosis not present

## 2018-04-18 DIAGNOSIS — Z9221 Personal history of antineoplastic chemotherapy: Secondary | ICD-10-CM | POA: Diagnosis not present

## 2018-04-18 DIAGNOSIS — Z9981 Dependence on supplemental oxygen: Secondary | ICD-10-CM | POA: Diagnosis not present

## 2018-04-21 DIAGNOSIS — R530 Neoplastic (malignant) related fatigue: Secondary | ICD-10-CM | POA: Diagnosis not present

## 2018-04-21 DIAGNOSIS — C3412 Malignant neoplasm of upper lobe, left bronchus or lung: Secondary | ICD-10-CM | POA: Diagnosis not present

## 2018-04-21 DIAGNOSIS — C7801 Secondary malignant neoplasm of right lung: Secondary | ICD-10-CM | POA: Diagnosis not present

## 2018-04-21 DIAGNOSIS — C781 Secondary malignant neoplasm of mediastinum: Secondary | ICD-10-CM | POA: Diagnosis not present

## 2018-04-21 DIAGNOSIS — D630 Anemia in neoplastic disease: Secondary | ICD-10-CM | POA: Diagnosis not present

## 2018-04-21 DIAGNOSIS — C7931 Secondary malignant neoplasm of brain: Secondary | ICD-10-CM | POA: Diagnosis not present

## 2018-04-23 DIAGNOSIS — C781 Secondary malignant neoplasm of mediastinum: Secondary | ICD-10-CM | POA: Diagnosis not present

## 2018-04-23 DIAGNOSIS — C3412 Malignant neoplasm of upper lobe, left bronchus or lung: Secondary | ICD-10-CM | POA: Diagnosis not present

## 2018-04-23 DIAGNOSIS — C7801 Secondary malignant neoplasm of right lung: Secondary | ICD-10-CM | POA: Diagnosis not present

## 2018-04-23 DIAGNOSIS — C7931 Secondary malignant neoplasm of brain: Secondary | ICD-10-CM | POA: Diagnosis not present

## 2018-04-23 DIAGNOSIS — R530 Neoplastic (malignant) related fatigue: Secondary | ICD-10-CM | POA: Diagnosis not present

## 2018-04-23 DIAGNOSIS — D630 Anemia in neoplastic disease: Secondary | ICD-10-CM | POA: Diagnosis not present

## 2018-04-27 DIAGNOSIS — D630 Anemia in neoplastic disease: Secondary | ICD-10-CM | POA: Diagnosis not present

## 2018-04-27 DIAGNOSIS — C7801 Secondary malignant neoplasm of right lung: Secondary | ICD-10-CM | POA: Diagnosis not present

## 2018-04-27 DIAGNOSIS — C781 Secondary malignant neoplasm of mediastinum: Secondary | ICD-10-CM | POA: Diagnosis not present

## 2018-04-27 DIAGNOSIS — Z8673 Personal history of transient ischemic attack (TIA), and cerebral infarction without residual deficits: Secondary | ICD-10-CM | POA: Diagnosis not present

## 2018-04-27 DIAGNOSIS — Z9981 Dependence on supplemental oxygen: Secondary | ICD-10-CM | POA: Diagnosis not present

## 2018-04-27 DIAGNOSIS — Z923 Personal history of irradiation: Secondary | ICD-10-CM | POA: Diagnosis not present

## 2018-04-27 DIAGNOSIS — C7931 Secondary malignant neoplasm of brain: Secondary | ICD-10-CM | POA: Diagnosis not present

## 2018-04-27 DIAGNOSIS — K5900 Constipation, unspecified: Secondary | ICD-10-CM | POA: Diagnosis not present

## 2018-04-27 DIAGNOSIS — E785 Hyperlipidemia, unspecified: Secondary | ICD-10-CM | POA: Diagnosis not present

## 2018-04-27 DIAGNOSIS — E0500 Thyrotoxicosis with diffuse goiter without thyrotoxic crisis or storm: Secondary | ICD-10-CM | POA: Diagnosis not present

## 2018-04-27 DIAGNOSIS — I10 Essential (primary) hypertension: Secondary | ICD-10-CM | POA: Diagnosis not present

## 2018-04-27 DIAGNOSIS — J439 Emphysema, unspecified: Secondary | ICD-10-CM | POA: Diagnosis not present

## 2018-04-27 DIAGNOSIS — C3412 Malignant neoplasm of upper lobe, left bronchus or lung: Secondary | ICD-10-CM | POA: Diagnosis not present

## 2018-04-27 DIAGNOSIS — R530 Neoplastic (malignant) related fatigue: Secondary | ICD-10-CM | POA: Diagnosis not present

## 2018-04-27 DIAGNOSIS — Z9221 Personal history of antineoplastic chemotherapy: Secondary | ICD-10-CM | POA: Diagnosis not present

## 2018-04-27 DIAGNOSIS — Z7982 Long term (current) use of aspirin: Secondary | ICD-10-CM | POA: Diagnosis not present

## 2018-04-27 DIAGNOSIS — J9611 Chronic respiratory failure with hypoxia: Secondary | ICD-10-CM | POA: Diagnosis not present

## 2018-04-28 DIAGNOSIS — C781 Secondary malignant neoplasm of mediastinum: Secondary | ICD-10-CM | POA: Diagnosis not present

## 2018-04-28 DIAGNOSIS — C7801 Secondary malignant neoplasm of right lung: Secondary | ICD-10-CM | POA: Diagnosis not present

## 2018-04-28 DIAGNOSIS — C7931 Secondary malignant neoplasm of brain: Secondary | ICD-10-CM | POA: Diagnosis not present

## 2018-04-28 DIAGNOSIS — C3412 Malignant neoplasm of upper lobe, left bronchus or lung: Secondary | ICD-10-CM | POA: Diagnosis not present

## 2018-04-28 DIAGNOSIS — R530 Neoplastic (malignant) related fatigue: Secondary | ICD-10-CM | POA: Diagnosis not present

## 2018-04-28 DIAGNOSIS — D630 Anemia in neoplastic disease: Secondary | ICD-10-CM | POA: Diagnosis not present

## 2018-05-12 DIAGNOSIS — C7931 Secondary malignant neoplasm of brain: Secondary | ICD-10-CM | POA: Diagnosis not present

## 2018-05-12 DIAGNOSIS — C7801 Secondary malignant neoplasm of right lung: Secondary | ICD-10-CM | POA: Diagnosis not present

## 2018-05-12 DIAGNOSIS — R530 Neoplastic (malignant) related fatigue: Secondary | ICD-10-CM | POA: Diagnosis not present

## 2018-05-12 DIAGNOSIS — D630 Anemia in neoplastic disease: Secondary | ICD-10-CM | POA: Diagnosis not present

## 2018-05-12 DIAGNOSIS — C3412 Malignant neoplasm of upper lobe, left bronchus or lung: Secondary | ICD-10-CM | POA: Diagnosis not present

## 2018-05-12 DIAGNOSIS — C781 Secondary malignant neoplasm of mediastinum: Secondary | ICD-10-CM | POA: Diagnosis not present

## 2018-05-18 DIAGNOSIS — C3412 Malignant neoplasm of upper lobe, left bronchus or lung: Secondary | ICD-10-CM | POA: Diagnosis not present

## 2018-05-18 DIAGNOSIS — D630 Anemia in neoplastic disease: Secondary | ICD-10-CM | POA: Diagnosis not present

## 2018-05-18 DIAGNOSIS — R530 Neoplastic (malignant) related fatigue: Secondary | ICD-10-CM | POA: Diagnosis not present

## 2018-05-18 DIAGNOSIS — C781 Secondary malignant neoplasm of mediastinum: Secondary | ICD-10-CM | POA: Diagnosis not present

## 2018-05-18 DIAGNOSIS — C7801 Secondary malignant neoplasm of right lung: Secondary | ICD-10-CM | POA: Diagnosis not present

## 2018-05-18 DIAGNOSIS — C7931 Secondary malignant neoplasm of brain: Secondary | ICD-10-CM | POA: Diagnosis not present

## 2018-05-26 DIAGNOSIS — C7801 Secondary malignant neoplasm of right lung: Secondary | ICD-10-CM | POA: Diagnosis not present

## 2018-05-26 DIAGNOSIS — C7931 Secondary malignant neoplasm of brain: Secondary | ICD-10-CM | POA: Diagnosis not present

## 2018-05-26 DIAGNOSIS — C781 Secondary malignant neoplasm of mediastinum: Secondary | ICD-10-CM | POA: Diagnosis not present

## 2018-05-26 DIAGNOSIS — R530 Neoplastic (malignant) related fatigue: Secondary | ICD-10-CM | POA: Diagnosis not present

## 2018-05-26 DIAGNOSIS — D630 Anemia in neoplastic disease: Secondary | ICD-10-CM | POA: Diagnosis not present

## 2018-05-26 DIAGNOSIS — C3412 Malignant neoplasm of upper lobe, left bronchus or lung: Secondary | ICD-10-CM | POA: Diagnosis not present

## 2018-05-27 DIAGNOSIS — C781 Secondary malignant neoplasm of mediastinum: Secondary | ICD-10-CM | POA: Diagnosis not present

## 2018-05-27 DIAGNOSIS — D630 Anemia in neoplastic disease: Secondary | ICD-10-CM | POA: Diagnosis not present

## 2018-05-27 DIAGNOSIS — C7801 Secondary malignant neoplasm of right lung: Secondary | ICD-10-CM | POA: Diagnosis not present

## 2018-05-27 DIAGNOSIS — C7931 Secondary malignant neoplasm of brain: Secondary | ICD-10-CM | POA: Diagnosis not present

## 2018-05-27 DIAGNOSIS — R530 Neoplastic (malignant) related fatigue: Secondary | ICD-10-CM | POA: Diagnosis not present

## 2018-05-27 DIAGNOSIS — C3412 Malignant neoplasm of upper lobe, left bronchus or lung: Secondary | ICD-10-CM | POA: Diagnosis not present

## 2018-05-28 DIAGNOSIS — Z7982 Long term (current) use of aspirin: Secondary | ICD-10-CM | POA: Diagnosis not present

## 2018-05-28 DIAGNOSIS — I10 Essential (primary) hypertension: Secondary | ICD-10-CM | POA: Diagnosis not present

## 2018-05-28 DIAGNOSIS — D630 Anemia in neoplastic disease: Secondary | ICD-10-CM | POA: Diagnosis not present

## 2018-05-28 DIAGNOSIS — C7931 Secondary malignant neoplasm of brain: Secondary | ICD-10-CM | POA: Diagnosis not present

## 2018-05-28 DIAGNOSIS — Z8673 Personal history of transient ischemic attack (TIA), and cerebral infarction without residual deficits: Secondary | ICD-10-CM | POA: Diagnosis not present

## 2018-05-28 DIAGNOSIS — C7801 Secondary malignant neoplasm of right lung: Secondary | ICD-10-CM | POA: Diagnosis not present

## 2018-05-28 DIAGNOSIS — E0500 Thyrotoxicosis with diffuse goiter without thyrotoxic crisis or storm: Secondary | ICD-10-CM | POA: Diagnosis not present

## 2018-05-28 DIAGNOSIS — J9611 Chronic respiratory failure with hypoxia: Secondary | ICD-10-CM | POA: Diagnosis not present

## 2018-05-28 DIAGNOSIS — C3412 Malignant neoplasm of upper lobe, left bronchus or lung: Secondary | ICD-10-CM | POA: Diagnosis not present

## 2018-05-28 DIAGNOSIS — R530 Neoplastic (malignant) related fatigue: Secondary | ICD-10-CM | POA: Diagnosis not present

## 2018-05-28 DIAGNOSIS — Z9221 Personal history of antineoplastic chemotherapy: Secondary | ICD-10-CM | POA: Diagnosis not present

## 2018-05-28 DIAGNOSIS — Z923 Personal history of irradiation: Secondary | ICD-10-CM | POA: Diagnosis not present

## 2018-05-28 DIAGNOSIS — J439 Emphysema, unspecified: Secondary | ICD-10-CM | POA: Diagnosis not present

## 2018-05-28 DIAGNOSIS — E785 Hyperlipidemia, unspecified: Secondary | ICD-10-CM | POA: Diagnosis not present

## 2018-05-28 DIAGNOSIS — C781 Secondary malignant neoplasm of mediastinum: Secondary | ICD-10-CM | POA: Diagnosis not present

## 2018-05-28 DIAGNOSIS — K5900 Constipation, unspecified: Secondary | ICD-10-CM | POA: Diagnosis not present

## 2018-05-28 DIAGNOSIS — Z9981 Dependence on supplemental oxygen: Secondary | ICD-10-CM | POA: Diagnosis not present

## 2018-06-02 DIAGNOSIS — D630 Anemia in neoplastic disease: Secondary | ICD-10-CM | POA: Diagnosis not present

## 2018-06-02 DIAGNOSIS — C3412 Malignant neoplasm of upper lobe, left bronchus or lung: Secondary | ICD-10-CM | POA: Diagnosis not present

## 2018-06-02 DIAGNOSIS — C7931 Secondary malignant neoplasm of brain: Secondary | ICD-10-CM | POA: Diagnosis not present

## 2018-06-02 DIAGNOSIS — C7801 Secondary malignant neoplasm of right lung: Secondary | ICD-10-CM | POA: Diagnosis not present

## 2018-06-02 DIAGNOSIS — C781 Secondary malignant neoplasm of mediastinum: Secondary | ICD-10-CM | POA: Diagnosis not present

## 2018-06-02 DIAGNOSIS — R530 Neoplastic (malignant) related fatigue: Secondary | ICD-10-CM | POA: Diagnosis not present

## 2018-06-04 DIAGNOSIS — C3412 Malignant neoplasm of upper lobe, left bronchus or lung: Secondary | ICD-10-CM | POA: Diagnosis not present

## 2018-06-04 DIAGNOSIS — C7931 Secondary malignant neoplasm of brain: Secondary | ICD-10-CM | POA: Diagnosis not present

## 2018-06-04 DIAGNOSIS — R530 Neoplastic (malignant) related fatigue: Secondary | ICD-10-CM | POA: Diagnosis not present

## 2018-06-04 DIAGNOSIS — C781 Secondary malignant neoplasm of mediastinum: Secondary | ICD-10-CM | POA: Diagnosis not present

## 2018-06-04 DIAGNOSIS — C7801 Secondary malignant neoplasm of right lung: Secondary | ICD-10-CM | POA: Diagnosis not present

## 2018-06-04 DIAGNOSIS — D630 Anemia in neoplastic disease: Secondary | ICD-10-CM | POA: Diagnosis not present

## 2018-06-09 DIAGNOSIS — R530 Neoplastic (malignant) related fatigue: Secondary | ICD-10-CM | POA: Diagnosis not present

## 2018-06-09 DIAGNOSIS — C7801 Secondary malignant neoplasm of right lung: Secondary | ICD-10-CM | POA: Diagnosis not present

## 2018-06-09 DIAGNOSIS — D630 Anemia in neoplastic disease: Secondary | ICD-10-CM | POA: Diagnosis not present

## 2018-06-09 DIAGNOSIS — C781 Secondary malignant neoplasm of mediastinum: Secondary | ICD-10-CM | POA: Diagnosis not present

## 2018-06-09 DIAGNOSIS — C3412 Malignant neoplasm of upper lobe, left bronchus or lung: Secondary | ICD-10-CM | POA: Diagnosis not present

## 2018-06-09 DIAGNOSIS — C7931 Secondary malignant neoplasm of brain: Secondary | ICD-10-CM | POA: Diagnosis not present

## 2018-06-12 DIAGNOSIS — C7931 Secondary malignant neoplasm of brain: Secondary | ICD-10-CM | POA: Diagnosis not present

## 2018-06-12 DIAGNOSIS — C781 Secondary malignant neoplasm of mediastinum: Secondary | ICD-10-CM | POA: Diagnosis not present

## 2018-06-12 DIAGNOSIS — C7801 Secondary malignant neoplasm of right lung: Secondary | ICD-10-CM | POA: Diagnosis not present

## 2018-06-12 DIAGNOSIS — R530 Neoplastic (malignant) related fatigue: Secondary | ICD-10-CM | POA: Diagnosis not present

## 2018-06-12 DIAGNOSIS — C3412 Malignant neoplasm of upper lobe, left bronchus or lung: Secondary | ICD-10-CM | POA: Diagnosis not present

## 2018-06-12 DIAGNOSIS — D630 Anemia in neoplastic disease: Secondary | ICD-10-CM | POA: Diagnosis not present

## 2018-06-13 DIAGNOSIS — C7801 Secondary malignant neoplasm of right lung: Secondary | ICD-10-CM | POA: Diagnosis not present

## 2018-06-13 DIAGNOSIS — C3412 Malignant neoplasm of upper lobe, left bronchus or lung: Secondary | ICD-10-CM | POA: Diagnosis not present

## 2018-06-13 DIAGNOSIS — C7931 Secondary malignant neoplasm of brain: Secondary | ICD-10-CM | POA: Diagnosis not present

## 2018-06-13 DIAGNOSIS — D630 Anemia in neoplastic disease: Secondary | ICD-10-CM | POA: Diagnosis not present

## 2018-06-13 DIAGNOSIS — R530 Neoplastic (malignant) related fatigue: Secondary | ICD-10-CM | POA: Diagnosis not present

## 2018-06-13 DIAGNOSIS — C781 Secondary malignant neoplasm of mediastinum: Secondary | ICD-10-CM | POA: Diagnosis not present

## 2018-06-14 DIAGNOSIS — C7801 Secondary malignant neoplasm of right lung: Secondary | ICD-10-CM | POA: Diagnosis not present

## 2018-06-14 DIAGNOSIS — C7931 Secondary malignant neoplasm of brain: Secondary | ICD-10-CM | POA: Diagnosis not present

## 2018-06-14 DIAGNOSIS — C3412 Malignant neoplasm of upper lobe, left bronchus or lung: Secondary | ICD-10-CM | POA: Diagnosis not present

## 2018-06-14 DIAGNOSIS — R530 Neoplastic (malignant) related fatigue: Secondary | ICD-10-CM | POA: Diagnosis not present

## 2018-06-14 DIAGNOSIS — D630 Anemia in neoplastic disease: Secondary | ICD-10-CM | POA: Diagnosis not present

## 2018-06-14 DIAGNOSIS — C781 Secondary malignant neoplasm of mediastinum: Secondary | ICD-10-CM | POA: Diagnosis not present

## 2018-06-16 DIAGNOSIS — D630 Anemia in neoplastic disease: Secondary | ICD-10-CM | POA: Diagnosis not present

## 2018-06-16 DIAGNOSIS — C7801 Secondary malignant neoplasm of right lung: Secondary | ICD-10-CM | POA: Diagnosis not present

## 2018-06-16 DIAGNOSIS — C7931 Secondary malignant neoplasm of brain: Secondary | ICD-10-CM | POA: Diagnosis not present

## 2018-06-16 DIAGNOSIS — C781 Secondary malignant neoplasm of mediastinum: Secondary | ICD-10-CM | POA: Diagnosis not present

## 2018-06-16 DIAGNOSIS — R530 Neoplastic (malignant) related fatigue: Secondary | ICD-10-CM | POA: Diagnosis not present

## 2018-06-16 DIAGNOSIS — C3412 Malignant neoplasm of upper lobe, left bronchus or lung: Secondary | ICD-10-CM | POA: Diagnosis not present

## 2018-06-20 DIAGNOSIS — C7931 Secondary malignant neoplasm of brain: Secondary | ICD-10-CM | POA: Diagnosis not present

## 2018-06-20 DIAGNOSIS — C7801 Secondary malignant neoplasm of right lung: Secondary | ICD-10-CM | POA: Diagnosis not present

## 2018-06-20 DIAGNOSIS — D630 Anemia in neoplastic disease: Secondary | ICD-10-CM | POA: Diagnosis not present

## 2018-06-20 DIAGNOSIS — C3412 Malignant neoplasm of upper lobe, left bronchus or lung: Secondary | ICD-10-CM | POA: Diagnosis not present

## 2018-06-20 DIAGNOSIS — R530 Neoplastic (malignant) related fatigue: Secondary | ICD-10-CM | POA: Diagnosis not present

## 2018-06-20 DIAGNOSIS — C781 Secondary malignant neoplasm of mediastinum: Secondary | ICD-10-CM | POA: Diagnosis not present

## 2018-06-23 DIAGNOSIS — C7931 Secondary malignant neoplasm of brain: Secondary | ICD-10-CM | POA: Diagnosis not present

## 2018-06-23 DIAGNOSIS — C7801 Secondary malignant neoplasm of right lung: Secondary | ICD-10-CM | POA: Diagnosis not present

## 2018-06-23 DIAGNOSIS — C781 Secondary malignant neoplasm of mediastinum: Secondary | ICD-10-CM | POA: Diagnosis not present

## 2018-06-23 DIAGNOSIS — C3412 Malignant neoplasm of upper lobe, left bronchus or lung: Secondary | ICD-10-CM | POA: Diagnosis not present

## 2018-06-23 DIAGNOSIS — D630 Anemia in neoplastic disease: Secondary | ICD-10-CM | POA: Diagnosis not present

## 2018-06-23 DIAGNOSIS — R530 Neoplastic (malignant) related fatigue: Secondary | ICD-10-CM | POA: Diagnosis not present

## 2018-06-27 DIAGNOSIS — Z9221 Personal history of antineoplastic chemotherapy: Secondary | ICD-10-CM | POA: Diagnosis not present

## 2018-06-27 DIAGNOSIS — R530 Neoplastic (malignant) related fatigue: Secondary | ICD-10-CM | POA: Diagnosis not present

## 2018-06-27 DIAGNOSIS — D630 Anemia in neoplastic disease: Secondary | ICD-10-CM | POA: Diagnosis not present

## 2018-06-27 DIAGNOSIS — Z9981 Dependence on supplemental oxygen: Secondary | ICD-10-CM | POA: Diagnosis not present

## 2018-06-27 DIAGNOSIS — Z7982 Long term (current) use of aspirin: Secondary | ICD-10-CM | POA: Diagnosis not present

## 2018-06-27 DIAGNOSIS — I10 Essential (primary) hypertension: Secondary | ICD-10-CM | POA: Diagnosis not present

## 2018-06-27 DIAGNOSIS — Z923 Personal history of irradiation: Secondary | ICD-10-CM | POA: Diagnosis not present

## 2018-06-27 DIAGNOSIS — J439 Emphysema, unspecified: Secondary | ICD-10-CM | POA: Diagnosis not present

## 2018-06-27 DIAGNOSIS — C7931 Secondary malignant neoplasm of brain: Secondary | ICD-10-CM | POA: Diagnosis not present

## 2018-06-27 DIAGNOSIS — E785 Hyperlipidemia, unspecified: Secondary | ICD-10-CM | POA: Diagnosis not present

## 2018-06-27 DIAGNOSIS — K5900 Constipation, unspecified: Secondary | ICD-10-CM | POA: Diagnosis not present

## 2018-06-27 DIAGNOSIS — C3412 Malignant neoplasm of upper lobe, left bronchus or lung: Secondary | ICD-10-CM | POA: Diagnosis not present

## 2018-06-27 DIAGNOSIS — C7801 Secondary malignant neoplasm of right lung: Secondary | ICD-10-CM | POA: Diagnosis not present

## 2018-06-27 DIAGNOSIS — Z8673 Personal history of transient ischemic attack (TIA), and cerebral infarction without residual deficits: Secondary | ICD-10-CM | POA: Diagnosis not present

## 2018-06-27 DIAGNOSIS — J9611 Chronic respiratory failure with hypoxia: Secondary | ICD-10-CM | POA: Diagnosis not present

## 2018-06-27 DIAGNOSIS — C781 Secondary malignant neoplasm of mediastinum: Secondary | ICD-10-CM | POA: Diagnosis not present

## 2018-06-27 DIAGNOSIS — E0500 Thyrotoxicosis with diffuse goiter without thyrotoxic crisis or storm: Secondary | ICD-10-CM | POA: Diagnosis not present

## 2018-06-30 DIAGNOSIS — D630 Anemia in neoplastic disease: Secondary | ICD-10-CM | POA: Diagnosis not present

## 2018-06-30 DIAGNOSIS — C3412 Malignant neoplasm of upper lobe, left bronchus or lung: Secondary | ICD-10-CM | POA: Diagnosis not present

## 2018-06-30 DIAGNOSIS — C7801 Secondary malignant neoplasm of right lung: Secondary | ICD-10-CM | POA: Diagnosis not present

## 2018-06-30 DIAGNOSIS — C7931 Secondary malignant neoplasm of brain: Secondary | ICD-10-CM | POA: Diagnosis not present

## 2018-06-30 DIAGNOSIS — C781 Secondary malignant neoplasm of mediastinum: Secondary | ICD-10-CM | POA: Diagnosis not present

## 2018-06-30 DIAGNOSIS — R530 Neoplastic (malignant) related fatigue: Secondary | ICD-10-CM | POA: Diagnosis not present

## 2018-07-07 DIAGNOSIS — C7931 Secondary malignant neoplasm of brain: Secondary | ICD-10-CM | POA: Diagnosis not present

## 2018-07-07 DIAGNOSIS — C3412 Malignant neoplasm of upper lobe, left bronchus or lung: Secondary | ICD-10-CM | POA: Diagnosis not present

## 2018-07-07 DIAGNOSIS — R530 Neoplastic (malignant) related fatigue: Secondary | ICD-10-CM | POA: Diagnosis not present

## 2018-07-07 DIAGNOSIS — C781 Secondary malignant neoplasm of mediastinum: Secondary | ICD-10-CM | POA: Diagnosis not present

## 2018-07-07 DIAGNOSIS — D630 Anemia in neoplastic disease: Secondary | ICD-10-CM | POA: Diagnosis not present

## 2018-07-07 DIAGNOSIS — C7801 Secondary malignant neoplasm of right lung: Secondary | ICD-10-CM | POA: Diagnosis not present

## 2018-07-08 DIAGNOSIS — D630 Anemia in neoplastic disease: Secondary | ICD-10-CM | POA: Diagnosis not present

## 2018-07-08 DIAGNOSIS — R530 Neoplastic (malignant) related fatigue: Secondary | ICD-10-CM | POA: Diagnosis not present

## 2018-07-08 DIAGNOSIS — C7931 Secondary malignant neoplasm of brain: Secondary | ICD-10-CM | POA: Diagnosis not present

## 2018-07-08 DIAGNOSIS — C781 Secondary malignant neoplasm of mediastinum: Secondary | ICD-10-CM | POA: Diagnosis not present

## 2018-07-08 DIAGNOSIS — C3412 Malignant neoplasm of upper lobe, left bronchus or lung: Secondary | ICD-10-CM | POA: Diagnosis not present

## 2018-07-08 DIAGNOSIS — C7801 Secondary malignant neoplasm of right lung: Secondary | ICD-10-CM | POA: Diagnosis not present

## 2018-07-14 DIAGNOSIS — C3412 Malignant neoplasm of upper lobe, left bronchus or lung: Secondary | ICD-10-CM | POA: Diagnosis not present

## 2018-07-14 DIAGNOSIS — D630 Anemia in neoplastic disease: Secondary | ICD-10-CM | POA: Diagnosis not present

## 2018-07-14 DIAGNOSIS — C7931 Secondary malignant neoplasm of brain: Secondary | ICD-10-CM | POA: Diagnosis not present

## 2018-07-14 DIAGNOSIS — R530 Neoplastic (malignant) related fatigue: Secondary | ICD-10-CM | POA: Diagnosis not present

## 2018-07-14 DIAGNOSIS — C781 Secondary malignant neoplasm of mediastinum: Secondary | ICD-10-CM | POA: Diagnosis not present

## 2018-07-14 DIAGNOSIS — C7801 Secondary malignant neoplasm of right lung: Secondary | ICD-10-CM | POA: Diagnosis not present

## 2018-07-20 DIAGNOSIS — R530 Neoplastic (malignant) related fatigue: Secondary | ICD-10-CM | POA: Diagnosis not present

## 2018-07-20 DIAGNOSIS — C781 Secondary malignant neoplasm of mediastinum: Secondary | ICD-10-CM | POA: Diagnosis not present

## 2018-07-20 DIAGNOSIS — C7931 Secondary malignant neoplasm of brain: Secondary | ICD-10-CM | POA: Diagnosis not present

## 2018-07-20 DIAGNOSIS — C7801 Secondary malignant neoplasm of right lung: Secondary | ICD-10-CM | POA: Diagnosis not present

## 2018-07-20 DIAGNOSIS — D630 Anemia in neoplastic disease: Secondary | ICD-10-CM | POA: Diagnosis not present

## 2018-07-20 DIAGNOSIS — C3412 Malignant neoplasm of upper lobe, left bronchus or lung: Secondary | ICD-10-CM | POA: Diagnosis not present

## 2018-07-26 DIAGNOSIS — C3412 Malignant neoplasm of upper lobe, left bronchus or lung: Secondary | ICD-10-CM | POA: Diagnosis not present

## 2018-07-26 DIAGNOSIS — C7801 Secondary malignant neoplasm of right lung: Secondary | ICD-10-CM | POA: Diagnosis not present

## 2018-07-26 DIAGNOSIS — C781 Secondary malignant neoplasm of mediastinum: Secondary | ICD-10-CM | POA: Diagnosis not present

## 2018-07-26 DIAGNOSIS — C7931 Secondary malignant neoplasm of brain: Secondary | ICD-10-CM | POA: Diagnosis not present

## 2018-07-26 DIAGNOSIS — D630 Anemia in neoplastic disease: Secondary | ICD-10-CM | POA: Diagnosis not present

## 2018-07-26 DIAGNOSIS — R530 Neoplastic (malignant) related fatigue: Secondary | ICD-10-CM | POA: Diagnosis not present

## 2018-07-28 DIAGNOSIS — I10 Essential (primary) hypertension: Secondary | ICD-10-CM | POA: Diagnosis not present

## 2018-07-28 DIAGNOSIS — K5900 Constipation, unspecified: Secondary | ICD-10-CM | POA: Diagnosis not present

## 2018-07-28 DIAGNOSIS — Z923 Personal history of irradiation: Secondary | ICD-10-CM | POA: Diagnosis not present

## 2018-07-28 DIAGNOSIS — E0500 Thyrotoxicosis with diffuse goiter without thyrotoxic crisis or storm: Secondary | ICD-10-CM | POA: Diagnosis not present

## 2018-07-28 DIAGNOSIS — Z9221 Personal history of antineoplastic chemotherapy: Secondary | ICD-10-CM | POA: Diagnosis not present

## 2018-07-28 DIAGNOSIS — R530 Neoplastic (malignant) related fatigue: Secondary | ICD-10-CM | POA: Diagnosis not present

## 2018-07-28 DIAGNOSIS — C7801 Secondary malignant neoplasm of right lung: Secondary | ICD-10-CM | POA: Diagnosis not present

## 2018-07-28 DIAGNOSIS — Z9981 Dependence on supplemental oxygen: Secondary | ICD-10-CM | POA: Diagnosis not present

## 2018-07-28 DIAGNOSIS — C781 Secondary malignant neoplasm of mediastinum: Secondary | ICD-10-CM | POA: Diagnosis not present

## 2018-07-28 DIAGNOSIS — E785 Hyperlipidemia, unspecified: Secondary | ICD-10-CM | POA: Diagnosis not present

## 2018-07-28 DIAGNOSIS — Z7982 Long term (current) use of aspirin: Secondary | ICD-10-CM | POA: Diagnosis not present

## 2018-07-28 DIAGNOSIS — J9611 Chronic respiratory failure with hypoxia: Secondary | ICD-10-CM | POA: Diagnosis not present

## 2018-07-28 DIAGNOSIS — C3412 Malignant neoplasm of upper lobe, left bronchus or lung: Secondary | ICD-10-CM | POA: Diagnosis not present

## 2018-07-28 DIAGNOSIS — Z8673 Personal history of transient ischemic attack (TIA), and cerebral infarction without residual deficits: Secondary | ICD-10-CM | POA: Diagnosis not present

## 2018-07-28 DIAGNOSIS — D630 Anemia in neoplastic disease: Secondary | ICD-10-CM | POA: Diagnosis not present

## 2018-07-28 DIAGNOSIS — J439 Emphysema, unspecified: Secondary | ICD-10-CM | POA: Diagnosis not present

## 2018-07-28 DIAGNOSIS — C7931 Secondary malignant neoplasm of brain: Secondary | ICD-10-CM | POA: Diagnosis not present

## 2018-07-29 DIAGNOSIS — R530 Neoplastic (malignant) related fatigue: Secondary | ICD-10-CM | POA: Diagnosis not present

## 2018-07-29 DIAGNOSIS — C781 Secondary malignant neoplasm of mediastinum: Secondary | ICD-10-CM | POA: Diagnosis not present

## 2018-07-29 DIAGNOSIS — C7931 Secondary malignant neoplasm of brain: Secondary | ICD-10-CM | POA: Diagnosis not present

## 2018-07-29 DIAGNOSIS — D630 Anemia in neoplastic disease: Secondary | ICD-10-CM | POA: Diagnosis not present

## 2018-07-29 DIAGNOSIS — C3412 Malignant neoplasm of upper lobe, left bronchus or lung: Secondary | ICD-10-CM | POA: Diagnosis not present

## 2018-07-29 DIAGNOSIS — C7801 Secondary malignant neoplasm of right lung: Secondary | ICD-10-CM | POA: Diagnosis not present

## 2018-08-04 DIAGNOSIS — C7801 Secondary malignant neoplasm of right lung: Secondary | ICD-10-CM | POA: Diagnosis not present

## 2018-08-04 DIAGNOSIS — D630 Anemia in neoplastic disease: Secondary | ICD-10-CM | POA: Diagnosis not present

## 2018-08-04 DIAGNOSIS — C7931 Secondary malignant neoplasm of brain: Secondary | ICD-10-CM | POA: Diagnosis not present

## 2018-08-04 DIAGNOSIS — C3412 Malignant neoplasm of upper lobe, left bronchus or lung: Secondary | ICD-10-CM | POA: Diagnosis not present

## 2018-08-04 DIAGNOSIS — R530 Neoplastic (malignant) related fatigue: Secondary | ICD-10-CM | POA: Diagnosis not present

## 2018-08-04 DIAGNOSIS — C781 Secondary malignant neoplasm of mediastinum: Secondary | ICD-10-CM | POA: Diagnosis not present

## 2018-08-06 DIAGNOSIS — C3412 Malignant neoplasm of upper lobe, left bronchus or lung: Secondary | ICD-10-CM | POA: Diagnosis not present

## 2018-08-06 DIAGNOSIS — C781 Secondary malignant neoplasm of mediastinum: Secondary | ICD-10-CM | POA: Diagnosis not present

## 2018-08-06 DIAGNOSIS — C7931 Secondary malignant neoplasm of brain: Secondary | ICD-10-CM | POA: Diagnosis not present

## 2018-08-06 DIAGNOSIS — D630 Anemia in neoplastic disease: Secondary | ICD-10-CM | POA: Diagnosis not present

## 2018-08-06 DIAGNOSIS — R530 Neoplastic (malignant) related fatigue: Secondary | ICD-10-CM | POA: Diagnosis not present

## 2018-08-06 DIAGNOSIS — C7801 Secondary malignant neoplasm of right lung: Secondary | ICD-10-CM | POA: Diagnosis not present

## 2018-08-09 DIAGNOSIS — C3412 Malignant neoplasm of upper lobe, left bronchus or lung: Secondary | ICD-10-CM | POA: Diagnosis not present

## 2018-08-09 DIAGNOSIS — C781 Secondary malignant neoplasm of mediastinum: Secondary | ICD-10-CM | POA: Diagnosis not present

## 2018-08-09 DIAGNOSIS — R530 Neoplastic (malignant) related fatigue: Secondary | ICD-10-CM | POA: Diagnosis not present

## 2018-08-09 DIAGNOSIS — C7801 Secondary malignant neoplasm of right lung: Secondary | ICD-10-CM | POA: Diagnosis not present

## 2018-08-09 DIAGNOSIS — C7931 Secondary malignant neoplasm of brain: Secondary | ICD-10-CM | POA: Diagnosis not present

## 2018-08-09 DIAGNOSIS — D630 Anemia in neoplastic disease: Secondary | ICD-10-CM | POA: Diagnosis not present

## 2018-08-10 DIAGNOSIS — C7801 Secondary malignant neoplasm of right lung: Secondary | ICD-10-CM | POA: Diagnosis not present

## 2018-08-10 DIAGNOSIS — C3412 Malignant neoplasm of upper lobe, left bronchus or lung: Secondary | ICD-10-CM | POA: Diagnosis not present

## 2018-08-10 DIAGNOSIS — C781 Secondary malignant neoplasm of mediastinum: Secondary | ICD-10-CM | POA: Diagnosis not present

## 2018-08-10 DIAGNOSIS — D630 Anemia in neoplastic disease: Secondary | ICD-10-CM | POA: Diagnosis not present

## 2018-08-10 DIAGNOSIS — C7931 Secondary malignant neoplasm of brain: Secondary | ICD-10-CM | POA: Diagnosis not present

## 2018-08-10 DIAGNOSIS — R530 Neoplastic (malignant) related fatigue: Secondary | ICD-10-CM | POA: Diagnosis not present

## 2018-08-11 DIAGNOSIS — C7801 Secondary malignant neoplasm of right lung: Secondary | ICD-10-CM | POA: Diagnosis not present

## 2018-08-11 DIAGNOSIS — C781 Secondary malignant neoplasm of mediastinum: Secondary | ICD-10-CM | POA: Diagnosis not present

## 2018-08-11 DIAGNOSIS — C7931 Secondary malignant neoplasm of brain: Secondary | ICD-10-CM | POA: Diagnosis not present

## 2018-08-11 DIAGNOSIS — C3412 Malignant neoplasm of upper lobe, left bronchus or lung: Secondary | ICD-10-CM | POA: Diagnosis not present

## 2018-08-11 DIAGNOSIS — R530 Neoplastic (malignant) related fatigue: Secondary | ICD-10-CM | POA: Diagnosis not present

## 2018-08-11 DIAGNOSIS — D630 Anemia in neoplastic disease: Secondary | ICD-10-CM | POA: Diagnosis not present

## 2018-08-18 DIAGNOSIS — C7801 Secondary malignant neoplasm of right lung: Secondary | ICD-10-CM | POA: Diagnosis not present

## 2018-08-18 DIAGNOSIS — D630 Anemia in neoplastic disease: Secondary | ICD-10-CM | POA: Diagnosis not present

## 2018-08-18 DIAGNOSIS — C781 Secondary malignant neoplasm of mediastinum: Secondary | ICD-10-CM | POA: Diagnosis not present

## 2018-08-18 DIAGNOSIS — C3412 Malignant neoplasm of upper lobe, left bronchus or lung: Secondary | ICD-10-CM | POA: Diagnosis not present

## 2018-08-18 DIAGNOSIS — R530 Neoplastic (malignant) related fatigue: Secondary | ICD-10-CM | POA: Diagnosis not present

## 2018-08-18 DIAGNOSIS — C7931 Secondary malignant neoplasm of brain: Secondary | ICD-10-CM | POA: Diagnosis not present

## 2018-08-25 DIAGNOSIS — C7931 Secondary malignant neoplasm of brain: Secondary | ICD-10-CM | POA: Diagnosis not present

## 2018-08-25 DIAGNOSIS — C3412 Malignant neoplasm of upper lobe, left bronchus or lung: Secondary | ICD-10-CM | POA: Diagnosis not present

## 2018-08-25 DIAGNOSIS — C781 Secondary malignant neoplasm of mediastinum: Secondary | ICD-10-CM | POA: Diagnosis not present

## 2018-08-25 DIAGNOSIS — D630 Anemia in neoplastic disease: Secondary | ICD-10-CM | POA: Diagnosis not present

## 2018-08-25 DIAGNOSIS — R530 Neoplastic (malignant) related fatigue: Secondary | ICD-10-CM | POA: Diagnosis not present

## 2018-08-25 DIAGNOSIS — C7801 Secondary malignant neoplasm of right lung: Secondary | ICD-10-CM | POA: Diagnosis not present

## 2018-08-27 DIAGNOSIS — C781 Secondary malignant neoplasm of mediastinum: Secondary | ICD-10-CM | POA: Diagnosis not present

## 2018-08-27 DIAGNOSIS — D630 Anemia in neoplastic disease: Secondary | ICD-10-CM | POA: Diagnosis not present

## 2018-08-27 DIAGNOSIS — C7931 Secondary malignant neoplasm of brain: Secondary | ICD-10-CM | POA: Diagnosis not present

## 2018-08-27 DIAGNOSIS — C3412 Malignant neoplasm of upper lobe, left bronchus or lung: Secondary | ICD-10-CM | POA: Diagnosis not present

## 2018-08-27 DIAGNOSIS — R530 Neoplastic (malignant) related fatigue: Secondary | ICD-10-CM | POA: Diagnosis not present

## 2018-08-27 DIAGNOSIS — C7801 Secondary malignant neoplasm of right lung: Secondary | ICD-10-CM | POA: Diagnosis not present

## 2018-08-28 DIAGNOSIS — C3412 Malignant neoplasm of upper lobe, left bronchus or lung: Secondary | ICD-10-CM | POA: Diagnosis not present

## 2018-08-28 DIAGNOSIS — Z9221 Personal history of antineoplastic chemotherapy: Secondary | ICD-10-CM | POA: Diagnosis not present

## 2018-08-28 DIAGNOSIS — Z923 Personal history of irradiation: Secondary | ICD-10-CM | POA: Diagnosis not present

## 2018-08-28 DIAGNOSIS — Z9981 Dependence on supplemental oxygen: Secondary | ICD-10-CM | POA: Diagnosis not present

## 2018-08-28 DIAGNOSIS — R530 Neoplastic (malignant) related fatigue: Secondary | ICD-10-CM | POA: Diagnosis not present

## 2018-08-28 DIAGNOSIS — E785 Hyperlipidemia, unspecified: Secondary | ICD-10-CM | POA: Diagnosis not present

## 2018-08-28 DIAGNOSIS — K5900 Constipation, unspecified: Secondary | ICD-10-CM | POA: Diagnosis not present

## 2018-08-28 DIAGNOSIS — C781 Secondary malignant neoplasm of mediastinum: Secondary | ICD-10-CM | POA: Diagnosis not present

## 2018-08-28 DIAGNOSIS — Z7982 Long term (current) use of aspirin: Secondary | ICD-10-CM | POA: Diagnosis not present

## 2018-08-28 DIAGNOSIS — D630 Anemia in neoplastic disease: Secondary | ICD-10-CM | POA: Diagnosis not present

## 2018-08-28 DIAGNOSIS — C7801 Secondary malignant neoplasm of right lung: Secondary | ICD-10-CM | POA: Diagnosis not present

## 2018-08-28 DIAGNOSIS — Z8673 Personal history of transient ischemic attack (TIA), and cerebral infarction without residual deficits: Secondary | ICD-10-CM | POA: Diagnosis not present

## 2018-08-28 DIAGNOSIS — C7931 Secondary malignant neoplasm of brain: Secondary | ICD-10-CM | POA: Diagnosis not present

## 2018-08-28 DIAGNOSIS — J439 Emphysema, unspecified: Secondary | ICD-10-CM | POA: Diagnosis not present

## 2018-08-28 DIAGNOSIS — I10 Essential (primary) hypertension: Secondary | ICD-10-CM | POA: Diagnosis not present

## 2018-08-28 DIAGNOSIS — J9611 Chronic respiratory failure with hypoxia: Secondary | ICD-10-CM | POA: Diagnosis not present

## 2018-08-28 DIAGNOSIS — E0500 Thyrotoxicosis with diffuse goiter without thyrotoxic crisis or storm: Secondary | ICD-10-CM | POA: Diagnosis not present

## 2018-09-01 DIAGNOSIS — C7931 Secondary malignant neoplasm of brain: Secondary | ICD-10-CM | POA: Diagnosis not present

## 2018-09-01 DIAGNOSIS — D630 Anemia in neoplastic disease: Secondary | ICD-10-CM | POA: Diagnosis not present

## 2018-09-01 DIAGNOSIS — C3412 Malignant neoplasm of upper lobe, left bronchus or lung: Secondary | ICD-10-CM | POA: Diagnosis not present

## 2018-09-01 DIAGNOSIS — C781 Secondary malignant neoplasm of mediastinum: Secondary | ICD-10-CM | POA: Diagnosis not present

## 2018-09-01 DIAGNOSIS — C7801 Secondary malignant neoplasm of right lung: Secondary | ICD-10-CM | POA: Diagnosis not present

## 2018-09-01 DIAGNOSIS — R530 Neoplastic (malignant) related fatigue: Secondary | ICD-10-CM | POA: Diagnosis not present

## 2018-09-07 DIAGNOSIS — D630 Anemia in neoplastic disease: Secondary | ICD-10-CM | POA: Diagnosis not present

## 2018-09-07 DIAGNOSIS — R530 Neoplastic (malignant) related fatigue: Secondary | ICD-10-CM | POA: Diagnosis not present

## 2018-09-07 DIAGNOSIS — C3412 Malignant neoplasm of upper lobe, left bronchus or lung: Secondary | ICD-10-CM | POA: Diagnosis not present

## 2018-09-07 DIAGNOSIS — C781 Secondary malignant neoplasm of mediastinum: Secondary | ICD-10-CM | POA: Diagnosis not present

## 2018-09-07 DIAGNOSIS — C7931 Secondary malignant neoplasm of brain: Secondary | ICD-10-CM | POA: Diagnosis not present

## 2018-09-07 DIAGNOSIS — C7801 Secondary malignant neoplasm of right lung: Secondary | ICD-10-CM | POA: Diagnosis not present

## 2018-09-08 DIAGNOSIS — C3412 Malignant neoplasm of upper lobe, left bronchus or lung: Secondary | ICD-10-CM | POA: Diagnosis not present

## 2018-09-08 DIAGNOSIS — C7801 Secondary malignant neoplasm of right lung: Secondary | ICD-10-CM | POA: Diagnosis not present

## 2018-09-08 DIAGNOSIS — D630 Anemia in neoplastic disease: Secondary | ICD-10-CM | POA: Diagnosis not present

## 2018-09-08 DIAGNOSIS — C7931 Secondary malignant neoplasm of brain: Secondary | ICD-10-CM | POA: Diagnosis not present

## 2018-09-08 DIAGNOSIS — C781 Secondary malignant neoplasm of mediastinum: Secondary | ICD-10-CM | POA: Diagnosis not present

## 2018-09-08 DIAGNOSIS — R530 Neoplastic (malignant) related fatigue: Secondary | ICD-10-CM | POA: Diagnosis not present

## 2018-09-13 DIAGNOSIS — D630 Anemia in neoplastic disease: Secondary | ICD-10-CM | POA: Diagnosis not present

## 2018-09-13 DIAGNOSIS — C7931 Secondary malignant neoplasm of brain: Secondary | ICD-10-CM | POA: Diagnosis not present

## 2018-09-13 DIAGNOSIS — R530 Neoplastic (malignant) related fatigue: Secondary | ICD-10-CM | POA: Diagnosis not present

## 2018-09-13 DIAGNOSIS — C7801 Secondary malignant neoplasm of right lung: Secondary | ICD-10-CM | POA: Diagnosis not present

## 2018-09-13 DIAGNOSIS — C781 Secondary malignant neoplasm of mediastinum: Secondary | ICD-10-CM | POA: Diagnosis not present

## 2018-09-13 DIAGNOSIS — C3412 Malignant neoplasm of upper lobe, left bronchus or lung: Secondary | ICD-10-CM | POA: Diagnosis not present

## 2018-09-15 DIAGNOSIS — C3412 Malignant neoplasm of upper lobe, left bronchus or lung: Secondary | ICD-10-CM | POA: Diagnosis not present

## 2018-09-15 DIAGNOSIS — D630 Anemia in neoplastic disease: Secondary | ICD-10-CM | POA: Diagnosis not present

## 2018-09-15 DIAGNOSIS — C7931 Secondary malignant neoplasm of brain: Secondary | ICD-10-CM | POA: Diagnosis not present

## 2018-09-15 DIAGNOSIS — C781 Secondary malignant neoplasm of mediastinum: Secondary | ICD-10-CM | POA: Diagnosis not present

## 2018-09-15 DIAGNOSIS — R530 Neoplastic (malignant) related fatigue: Secondary | ICD-10-CM | POA: Diagnosis not present

## 2018-09-15 DIAGNOSIS — C7801 Secondary malignant neoplasm of right lung: Secondary | ICD-10-CM | POA: Diagnosis not present

## 2018-09-17 DIAGNOSIS — C7801 Secondary malignant neoplasm of right lung: Secondary | ICD-10-CM | POA: Diagnosis not present

## 2018-09-17 DIAGNOSIS — C7931 Secondary malignant neoplasm of brain: Secondary | ICD-10-CM | POA: Diagnosis not present

## 2018-09-17 DIAGNOSIS — C3412 Malignant neoplasm of upper lobe, left bronchus or lung: Secondary | ICD-10-CM | POA: Diagnosis not present

## 2018-09-17 DIAGNOSIS — C781 Secondary malignant neoplasm of mediastinum: Secondary | ICD-10-CM | POA: Diagnosis not present

## 2018-09-17 DIAGNOSIS — R530 Neoplastic (malignant) related fatigue: Secondary | ICD-10-CM | POA: Diagnosis not present

## 2018-09-17 DIAGNOSIS — D630 Anemia in neoplastic disease: Secondary | ICD-10-CM | POA: Diagnosis not present

## 2018-09-19 DIAGNOSIS — R530 Neoplastic (malignant) related fatigue: Secondary | ICD-10-CM | POA: Diagnosis not present

## 2018-09-19 DIAGNOSIS — C781 Secondary malignant neoplasm of mediastinum: Secondary | ICD-10-CM | POA: Diagnosis not present

## 2018-09-19 DIAGNOSIS — C3412 Malignant neoplasm of upper lobe, left bronchus or lung: Secondary | ICD-10-CM | POA: Diagnosis not present

## 2018-09-19 DIAGNOSIS — C7801 Secondary malignant neoplasm of right lung: Secondary | ICD-10-CM | POA: Diagnosis not present

## 2018-09-19 DIAGNOSIS — D630 Anemia in neoplastic disease: Secondary | ICD-10-CM | POA: Diagnosis not present

## 2018-09-19 DIAGNOSIS — C7931 Secondary malignant neoplasm of brain: Secondary | ICD-10-CM | POA: Diagnosis not present

## 2018-09-20 DIAGNOSIS — C7931 Secondary malignant neoplasm of brain: Secondary | ICD-10-CM | POA: Diagnosis not present

## 2018-09-20 DIAGNOSIS — D630 Anemia in neoplastic disease: Secondary | ICD-10-CM | POA: Diagnosis not present

## 2018-09-20 DIAGNOSIS — C781 Secondary malignant neoplasm of mediastinum: Secondary | ICD-10-CM | POA: Diagnosis not present

## 2018-09-20 DIAGNOSIS — C7801 Secondary malignant neoplasm of right lung: Secondary | ICD-10-CM | POA: Diagnosis not present

## 2018-09-20 DIAGNOSIS — R530 Neoplastic (malignant) related fatigue: Secondary | ICD-10-CM | POA: Diagnosis not present

## 2018-09-20 DIAGNOSIS — C3412 Malignant neoplasm of upper lobe, left bronchus or lung: Secondary | ICD-10-CM | POA: Diagnosis not present

## 2018-09-26 DEATH — deceased

## 2018-09-28 ENCOUNTER — Ambulatory Visit: Admitting: Internal Medicine

## 2018-10-06 ENCOUNTER — Ambulatory Visit: Payer: Self-pay
